# Patient Record
Sex: Female | Born: 1935 | Race: Black or African American | Hispanic: No | Marital: Single | Smoking: Never smoker
Health system: Southern US, Community
[De-identification: ages and names within clinical notes are randomized; demographics above are authoritative.]

## PROBLEM LIST (undated history)

## (undated) DIAGNOSIS — N184 Chronic kidney disease, stage 4 (severe): Secondary | ICD-10-CM

## (undated) DIAGNOSIS — M1990 Unspecified osteoarthritis, unspecified site: Secondary | ICD-10-CM

## (undated) DIAGNOSIS — I5032 Chronic diastolic (congestive) heart failure: Secondary | ICD-10-CM

## (undated) DIAGNOSIS — R011 Cardiac murmur, unspecified: Secondary | ICD-10-CM

## (undated) DIAGNOSIS — I701 Atherosclerosis of renal artery: Secondary | ICD-10-CM

## (undated) DIAGNOSIS — I779 Disorder of arteries and arterioles, unspecified: Secondary | ICD-10-CM

## (undated) DIAGNOSIS — F329 Major depressive disorder, single episode, unspecified: Secondary | ICD-10-CM

## (undated) DIAGNOSIS — I272 Pulmonary hypertension, unspecified: Secondary | ICD-10-CM

## (undated) DIAGNOSIS — D649 Anemia, unspecified: Secondary | ICD-10-CM

## (undated) DIAGNOSIS — I130 Hypertensive heart and chronic kidney disease with heart failure and stage 1 through stage 4 chronic kidney disease, or unspecified chronic kidney disease: Secondary | ICD-10-CM

## (undated) DIAGNOSIS — F32A Depression, unspecified: Secondary | ICD-10-CM

## (undated) DIAGNOSIS — F419 Anxiety disorder, unspecified: Secondary | ICD-10-CM

## (undated) DIAGNOSIS — R001 Bradycardia, unspecified: Secondary | ICD-10-CM

## (undated) DIAGNOSIS — N189 Chronic kidney disease, unspecified: Secondary | ICD-10-CM

## (undated) DIAGNOSIS — I739 Peripheral vascular disease, unspecified: Secondary | ICD-10-CM

## (undated) DIAGNOSIS — E7800 Pure hypercholesterolemia, unspecified: Secondary | ICD-10-CM

## (undated) DIAGNOSIS — Z86718 Personal history of other venous thrombosis and embolism: Secondary | ICD-10-CM

## (undated) HISTORY — DX: Pulmonary hypertension, unspecified: I27.20

## (undated) HISTORY — DX: Disorder of arteries and arterioles, unspecified: I77.9

## (undated) HISTORY — DX: Bradycardia, unspecified: R00.1

## (undated) HISTORY — DX: Personal history of other venous thrombosis and embolism: Z86.718

## (undated) HISTORY — PX: OTHER SURGICAL HISTORY: SHX169

## (undated) HISTORY — PX: JOINT REPLACEMENT: SHX530

## (undated) HISTORY — DX: Anxiety disorder, unspecified: F41.9

## (undated) HISTORY — DX: Chronic kidney disease, unspecified: N18.9

## (undated) HISTORY — DX: Chronic kidney disease, stage 4 (severe): N18.4

## (undated) HISTORY — DX: Depression, unspecified: F32.A

## (undated) HISTORY — DX: Peripheral vascular disease, unspecified: I73.9

## (undated) HISTORY — DX: Anemia, unspecified: D64.9

## (undated) HISTORY — DX: Hypertensive heart and chronic kidney disease with heart failure and stage 1 through stage 4 chronic kidney disease, or unspecified chronic kidney disease: I13.0

## (undated) HISTORY — DX: Chronic diastolic (congestive) heart failure: I50.32

## (undated) HISTORY — DX: Atherosclerosis of renal artery: I70.1

---
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
## 1998-02-12 ENCOUNTER — Emergency Department (HOSPITAL_COMMUNITY): Admission: EM | Admit: 1998-02-12 | Discharge: 1998-02-12 | Payer: Self-pay | Admitting: Emergency Medicine

## 1998-03-20 ENCOUNTER — Encounter: Admission: RE | Admit: 1998-03-20 | Discharge: 1998-06-18 | Payer: Self-pay | Admitting: Orthopedic Surgery

## 1998-10-14 ENCOUNTER — Encounter: Payer: Self-pay | Admitting: Family Medicine

## 1998-10-14 ENCOUNTER — Ambulatory Visit (HOSPITAL_COMMUNITY): Admission: RE | Admit: 1998-10-14 | Discharge: 1998-10-14 | Payer: Self-pay | Admitting: Family Medicine

## 1998-10-20 ENCOUNTER — Encounter: Admission: RE | Admit: 1998-10-20 | Discharge: 1998-11-04 | Payer: Self-pay | Admitting: Family Medicine

## 1999-03-10 ENCOUNTER — Ambulatory Visit (HOSPITAL_COMMUNITY): Admission: RE | Admit: 1999-03-10 | Discharge: 1999-03-10 | Payer: Self-pay | Admitting: Family Medicine

## 1999-04-30 ENCOUNTER — Emergency Department (HOSPITAL_COMMUNITY): Admission: EM | Admit: 1999-04-30 | Discharge: 1999-04-30 | Payer: Self-pay | Admitting: Emergency Medicine

## 1999-04-30 ENCOUNTER — Encounter: Payer: Self-pay | Admitting: Emergency Medicine

## 2000-02-17 ENCOUNTER — Ambulatory Visit (HOSPITAL_COMMUNITY): Admission: RE | Admit: 2000-02-17 | Discharge: 2000-02-17 | Payer: Self-pay | Admitting: Gastroenterology

## 2000-08-10 ENCOUNTER — Encounter: Payer: Self-pay | Admitting: Family Medicine

## 2000-08-10 ENCOUNTER — Ambulatory Visit (HOSPITAL_COMMUNITY): Admission: RE | Admit: 2000-08-10 | Discharge: 2000-08-10 | Payer: Self-pay | Admitting: Family Medicine

## 2000-10-04 ENCOUNTER — Encounter: Payer: Self-pay | Admitting: Cardiology

## 2000-10-04 ENCOUNTER — Ambulatory Visit (HOSPITAL_COMMUNITY): Admission: RE | Admit: 2000-10-04 | Discharge: 2000-10-04 | Payer: Self-pay | Admitting: Cardiology

## 2000-10-13 ENCOUNTER — Ambulatory Visit (HOSPITAL_COMMUNITY): Admission: RE | Admit: 2000-10-13 | Discharge: 2000-10-13 | Payer: Self-pay | Admitting: Cardiology

## 2000-10-17 ENCOUNTER — Ambulatory Visit (HOSPITAL_COMMUNITY): Admission: RE | Admit: 2000-10-17 | Discharge: 2000-10-17 | Payer: Self-pay | Admitting: Cardiology

## 2000-10-17 ENCOUNTER — Encounter: Payer: Self-pay | Admitting: Cardiology

## 2000-12-14 ENCOUNTER — Ambulatory Visit (HOSPITAL_COMMUNITY): Admission: RE | Admit: 2000-12-14 | Discharge: 2000-12-14 | Payer: Self-pay | Admitting: *Deleted

## 2001-05-23 ENCOUNTER — Ambulatory Visit (HOSPITAL_COMMUNITY): Admission: RE | Admit: 2001-05-23 | Discharge: 2001-05-23 | Payer: Self-pay | Admitting: Orthopedic Surgery

## 2001-05-23 ENCOUNTER — Encounter (INDEPENDENT_AMBULATORY_CARE_PROVIDER_SITE_OTHER): Payer: Self-pay | Admitting: *Deleted

## 2001-06-20 ENCOUNTER — Other Ambulatory Visit: Admission: RE | Admit: 2001-06-20 | Discharge: 2001-06-20 | Payer: Self-pay | Admitting: Family Medicine

## 2001-07-06 ENCOUNTER — Ambulatory Visit (HOSPITAL_COMMUNITY): Admission: RE | Admit: 2001-07-06 | Discharge: 2001-07-06 | Payer: Self-pay | Admitting: Family Medicine

## 2001-07-06 ENCOUNTER — Encounter: Payer: Self-pay | Admitting: Family Medicine

## 2004-06-16 ENCOUNTER — Encounter (INDEPENDENT_AMBULATORY_CARE_PROVIDER_SITE_OTHER): Payer: Self-pay | Admitting: Specialist

## 2004-06-16 ENCOUNTER — Ambulatory Visit (HOSPITAL_COMMUNITY): Admission: RE | Admit: 2004-06-16 | Discharge: 2004-06-16 | Payer: Self-pay | Admitting: Gastroenterology

## 2004-07-15 ENCOUNTER — Ambulatory Visit (HOSPITAL_COMMUNITY): Admission: RE | Admit: 2004-07-15 | Discharge: 2004-07-15 | Payer: Self-pay | Admitting: Gastroenterology

## 2005-01-13 ENCOUNTER — Other Ambulatory Visit: Admission: RE | Admit: 2005-01-13 | Discharge: 2005-01-13 | Payer: Self-pay | Admitting: Family Medicine

## 2005-02-03 ENCOUNTER — Ambulatory Visit (HOSPITAL_COMMUNITY): Admission: RE | Admit: 2005-02-03 | Discharge: 2005-02-03 | Payer: Self-pay | Admitting: Orthopedic Surgery

## 2005-02-23 ENCOUNTER — Encounter: Admission: RE | Admit: 2005-02-23 | Discharge: 2005-02-23 | Payer: Self-pay | Admitting: Family Medicine

## 2005-04-09 ENCOUNTER — Encounter: Admission: RE | Admit: 2005-04-09 | Discharge: 2005-04-22 | Payer: Self-pay | Admitting: Orthopedic Surgery

## 2005-09-30 ENCOUNTER — Inpatient Hospital Stay (HOSPITAL_COMMUNITY): Admission: RE | Admit: 2005-09-30 | Discharge: 2005-10-05 | Payer: Self-pay | Admitting: Orthopedic Surgery

## 2005-11-04 ENCOUNTER — Encounter: Admission: RE | Admit: 2005-11-04 | Discharge: 2005-11-04 | Payer: Self-pay | Admitting: Orthopedic Surgery

## 2006-04-13 ENCOUNTER — Encounter: Admission: RE | Admit: 2006-04-13 | Discharge: 2006-04-13 | Payer: Self-pay | Admitting: Family Medicine

## 2007-03-08 ENCOUNTER — Encounter: Admission: RE | Admit: 2007-03-08 | Discharge: 2007-03-08 | Payer: Self-pay | Admitting: Orthopedic Surgery

## 2007-05-16 ENCOUNTER — Encounter (INDEPENDENT_AMBULATORY_CARE_PROVIDER_SITE_OTHER): Payer: Self-pay | Admitting: Family Medicine

## 2007-05-16 ENCOUNTER — Ambulatory Visit: Admission: RE | Admit: 2007-05-16 | Discharge: 2007-05-16 | Payer: Self-pay | Admitting: Family Medicine

## 2007-05-16 ENCOUNTER — Ambulatory Visit: Payer: Self-pay | Admitting: Vascular Surgery

## 2007-06-06 ENCOUNTER — Encounter: Admission: RE | Admit: 2007-06-06 | Discharge: 2007-06-06 | Payer: Self-pay | Admitting: Family Medicine

## 2007-06-08 ENCOUNTER — Ambulatory Visit: Payer: Self-pay | Admitting: *Deleted

## 2007-06-13 ENCOUNTER — Other Ambulatory Visit: Admission: RE | Admit: 2007-06-13 | Discharge: 2007-06-13 | Payer: Self-pay | Admitting: Family Medicine

## 2007-11-15 ENCOUNTER — Emergency Department (HOSPITAL_COMMUNITY): Admission: EM | Admit: 2007-11-15 | Discharge: 2007-11-15 | Payer: Self-pay | Admitting: Emergency Medicine

## 2007-11-21 ENCOUNTER — Encounter: Admission: RE | Admit: 2007-11-21 | Discharge: 2008-01-03 | Payer: Self-pay | Admitting: Family Medicine

## 2008-01-16 ENCOUNTER — Inpatient Hospital Stay (HOSPITAL_COMMUNITY): Admission: RE | Admit: 2008-01-16 | Discharge: 2008-01-19 | Payer: Self-pay | Admitting: Orthopedic Surgery

## 2008-02-25 ENCOUNTER — Emergency Department (HOSPITAL_COMMUNITY): Admission: EM | Admit: 2008-02-25 | Discharge: 2008-02-25 | Payer: Self-pay | Admitting: Emergency Medicine

## 2008-03-02 ENCOUNTER — Inpatient Hospital Stay (HOSPITAL_COMMUNITY): Admission: RE | Admit: 2008-03-02 | Discharge: 2008-03-05 | Payer: Self-pay | Admitting: Orthopedic Surgery

## 2008-12-13 ENCOUNTER — Encounter: Admission: RE | Admit: 2008-12-13 | Discharge: 2008-12-13 | Payer: Self-pay | Admitting: Orthopedic Surgery

## 2008-12-19 ENCOUNTER — Inpatient Hospital Stay (HOSPITAL_COMMUNITY): Admission: RE | Admit: 2008-12-19 | Discharge: 2008-12-24 | Payer: Self-pay | Admitting: Orthopedic Surgery

## 2009-02-04 ENCOUNTER — Encounter: Admission: RE | Admit: 2009-02-04 | Discharge: 2009-05-05 | Payer: Self-pay | Admitting: Orthopedic Surgery

## 2009-03-19 ENCOUNTER — Other Ambulatory Visit: Admission: RE | Admit: 2009-03-19 | Discharge: 2009-03-19 | Payer: Self-pay | Admitting: Family Medicine

## 2009-04-02 ENCOUNTER — Encounter: Admission: RE | Admit: 2009-04-02 | Discharge: 2009-04-02 | Payer: Self-pay | Admitting: Family Medicine

## 2009-05-07 ENCOUNTER — Encounter: Admission: RE | Admit: 2009-05-07 | Discharge: 2009-05-07 | Payer: Self-pay | Admitting: Orthopedic Surgery

## 2009-08-07 ENCOUNTER — Encounter: Admission: RE | Admit: 2009-08-07 | Discharge: 2009-08-07 | Payer: Self-pay | Admitting: Orthopedic Surgery

## 2010-06-19 IMAGING — CT CT ANGIO CHEST
2 of 6 series · 19 of 36 positions shown · IV contrast (APPLIED)
Comparison: Plain film 12/19/2008.

CLINICAL DATA: Patellar tendon rupture.  Short of breath.  Chest
pain.  Evaluate for pulmonary embolus.  History over orthopedic
injury.

CT ANGIOGRAPHY CHEST
TECHNIQUE: Multidetector CT imaging of the chest was performed
using the standard protocol during bolus administration of
intravenous contrast. Multiplanar CT image reconstructions
including MIPs were obtained to evaluate the vascular anatomy.
Contrast: 100 ml [id number].

[Series 9: pulm embolism 1.0 b25f thins · axial · 0.63mm/px · z∈[-118,+78]mm · 16 of 218 slices shown]
[im 11/218  lung]
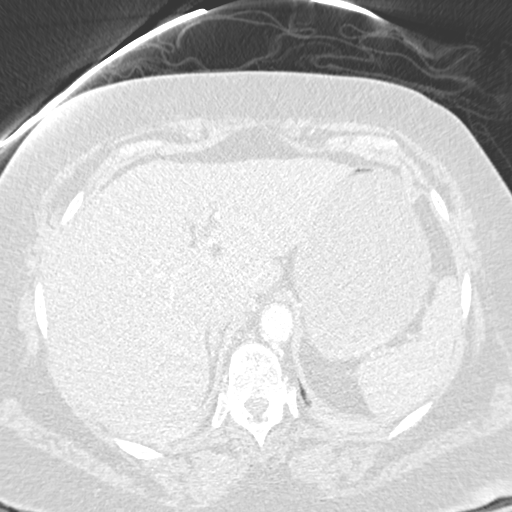
[im 22/218  mediastinal]
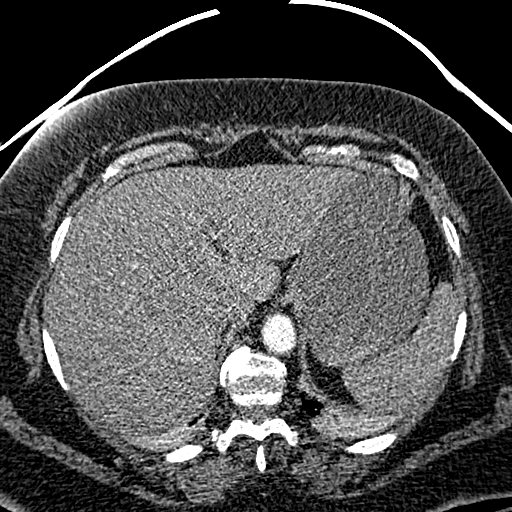
[im 33/218  lung]
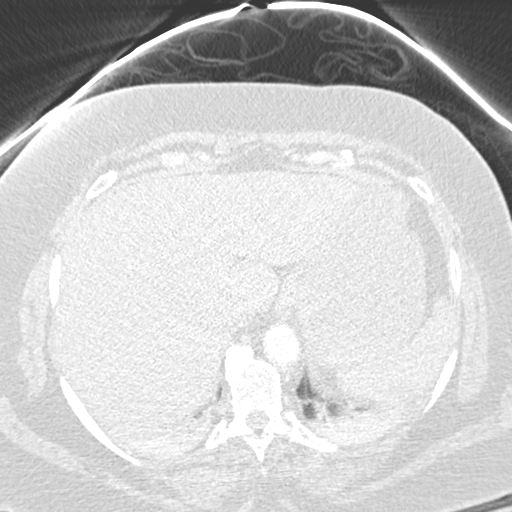
[im 55/218  mediastinal]
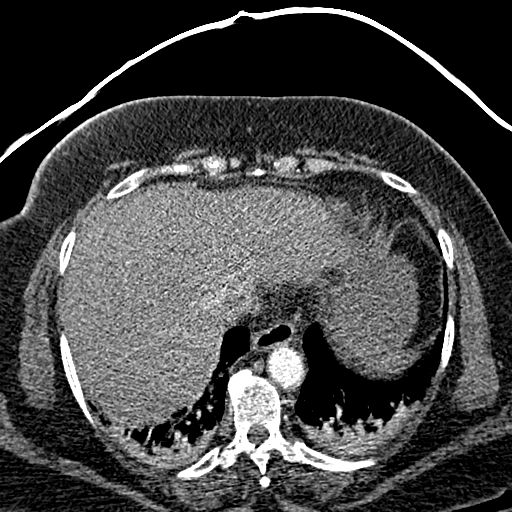
[im 66/218  lung]
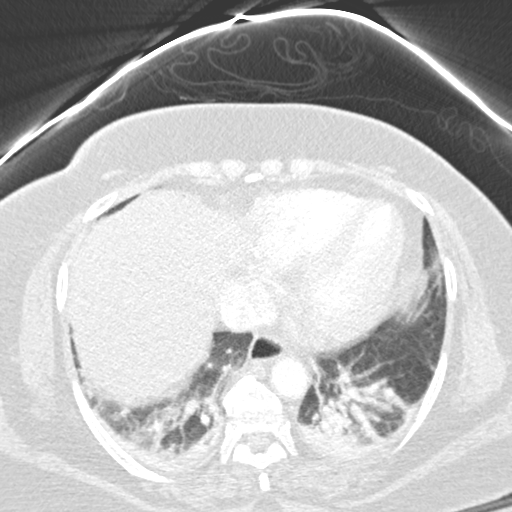
[im 76/218  mediastinal]
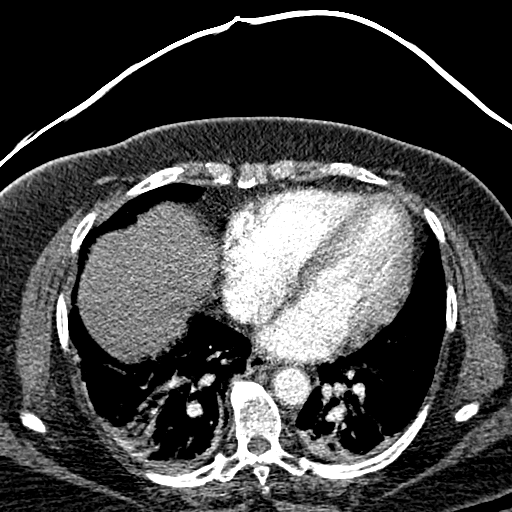
[im 87/218  lung]
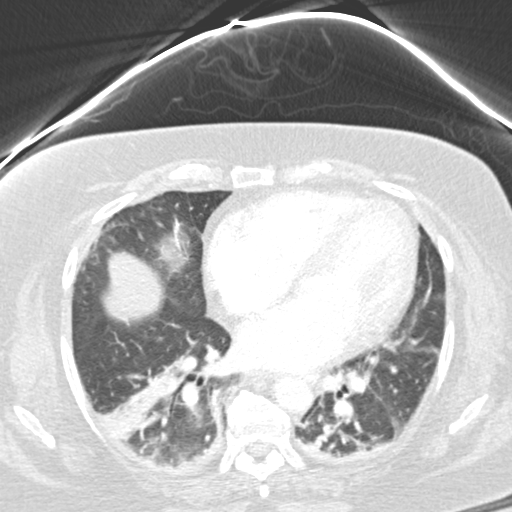
[im 98/218  mediastinal]
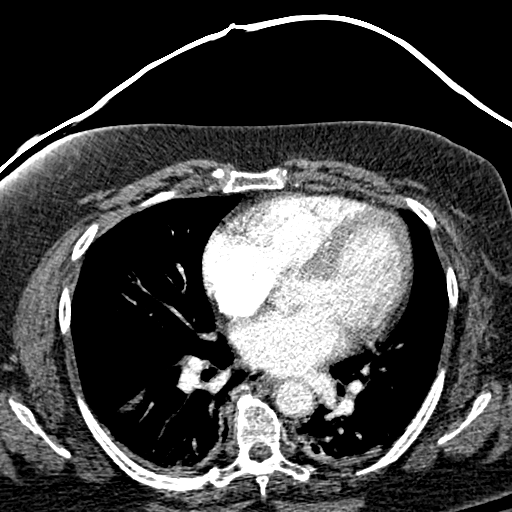
[im 120/218  lung]
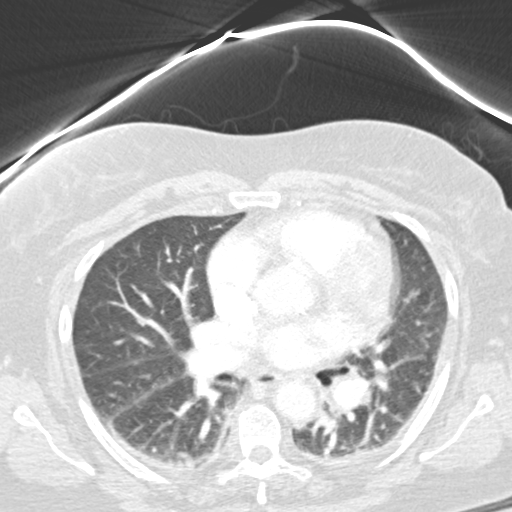
[im 131/218  mediastinal]
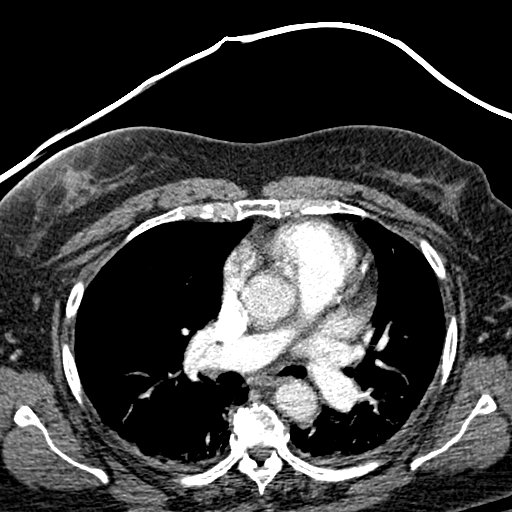
[im 142/218  lung]
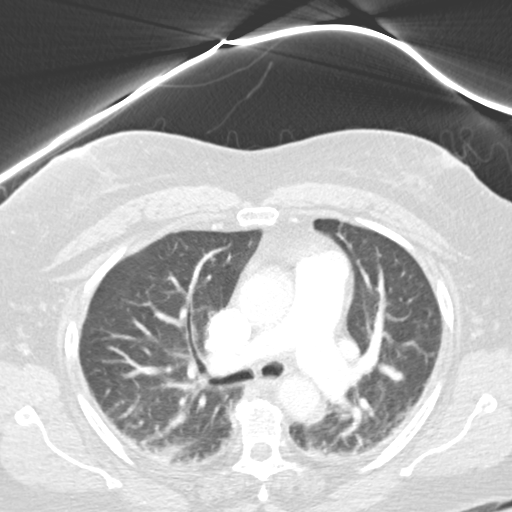
[im 152/218  mediastinal]
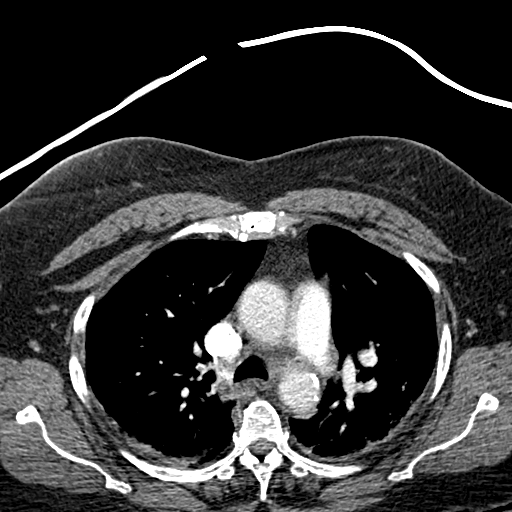
[im 163/218  lung]
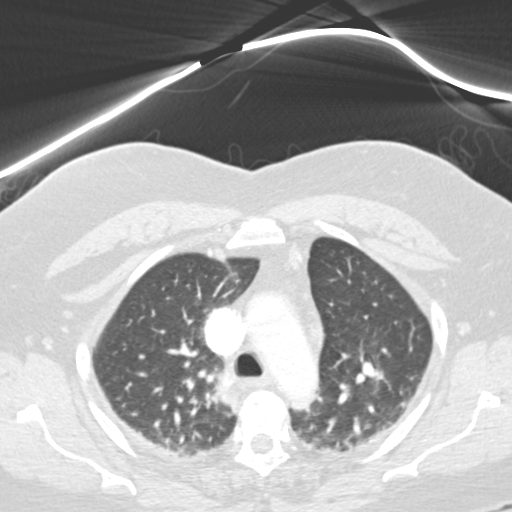
[im 185/218  mediastinal]
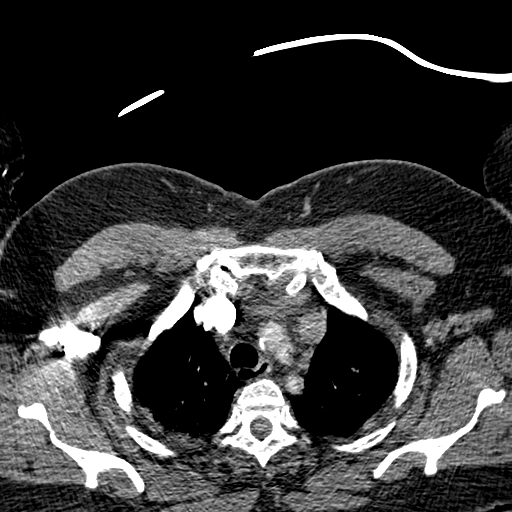
[im 196/218  lung]
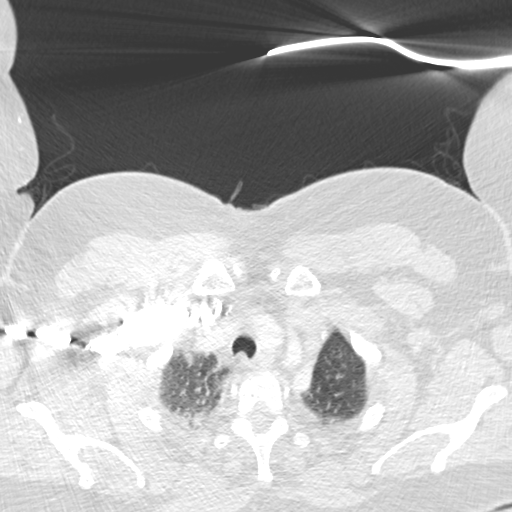
[im 207/218  mediastinal]
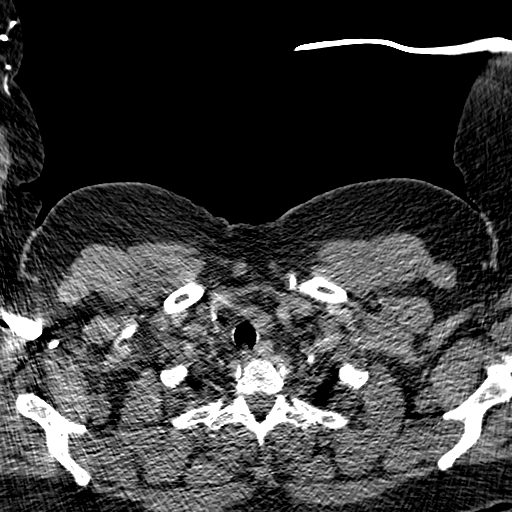

[Series 10: pulm embolism 0.75 spo thins · coronal · 0.63mm/px · 3 of 778 slices shown]
[im 195/778  mediastinal]
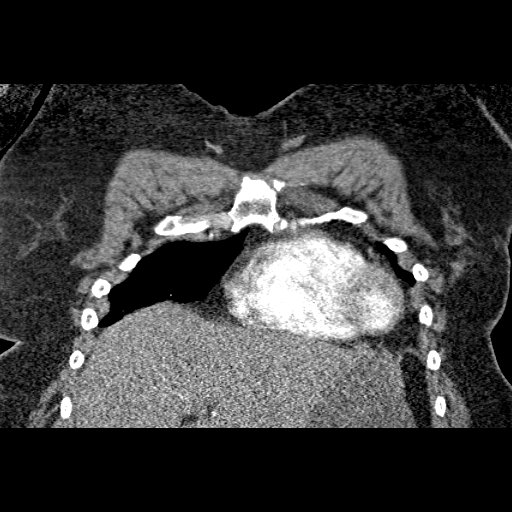
[im 389/778  mediastinal]
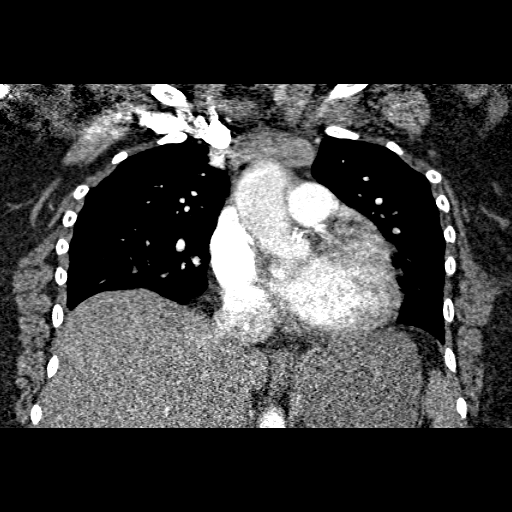
[im 583/778  mediastinal]
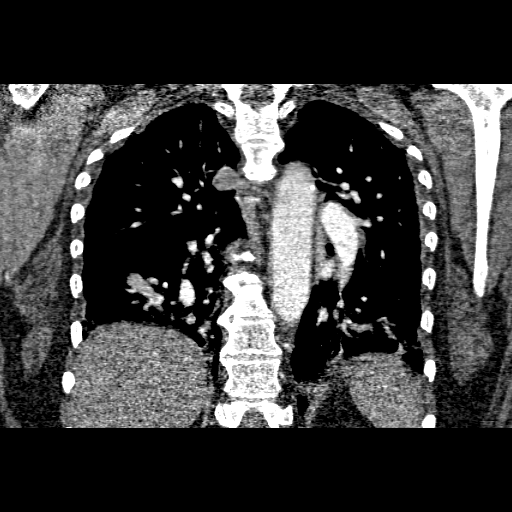

[19 of 36 positions shown; findings below may reference images not displayed]

FINDINGS: Study is mildly limited in its evaluation of pulmonary
embolus due to bolus dispersion.  Subsegmental vessels not
evaluated and some of the segmental vessels are poorly seen as
well.  Larger vessels are well evaluated.  No central pulmonary
embolus is present.  Aorta demonstrates atherosclerosis which
extends into the branch vessels.  Coronary artery atherosclerosis
is present. If office based assessment of coronary risk factors has
not been performed, it is now recommended.  .  Incidental imaging
of the upper abdomen is unremarkable.  No pleural effusions are
present.  Substantial dependent atelectasis is present with
segmental and segmental atelectasis in the right lower lobe.  No
focal airspace disease is present however difficult to exclude
based on the amount of atelectasis.  There is some mild
interlobular septal thickening at the apices suggesting volume
overload as well.  Bone windows appear within normal limits.
Diffuse idiopathic skeletal hyperostosis of the thoracic spine is
present.

 Review of the MIP images confirms the above findings.
IMPRESSION: 1.  Technically adequate study for evaluation of central pulmonary
embolus.  No pulmonary embolus is present.
2.  Substantial dependent atelectasis in the lungs, which can be
associated with shortness of breath.
3.  Atherosclerosis and coronary artery disease.

## 2010-08-27 ENCOUNTER — Encounter
Admission: RE | Admit: 2010-08-27 | Discharge: 2010-08-27 | Payer: Self-pay | Source: Home / Self Care | Attending: Family Medicine | Admitting: Family Medicine

## 2010-12-09 LAB — COMPREHENSIVE METABOLIC PANEL
ALT: 11 U/L (ref 0–35)
ALT: 13 U/L (ref 0–35)
AST: 31 U/L (ref 0–37)
AST: 44 U/L — ABNORMAL HIGH (ref 0–37)
Albumin: 2.8 g/dL — ABNORMAL LOW (ref 3.5–5.2)
Albumin: 3.4 g/dL — ABNORMAL LOW (ref 3.5–5.2)
Alkaline Phosphatase: 53 U/L (ref 39–117)
Alkaline Phosphatase: 56 U/L (ref 39–117)
Alkaline Phosphatase: 72 U/L (ref 39–117)
BUN: 14 mg/dL (ref 6–23)
BUN: 17 mg/dL (ref 6–23)
CO2: 26 mEq/L (ref 19–32)
CO2: 27 mEq/L (ref 19–32)
Chloride: 103 mEq/L (ref 96–112)
Creatinine, Ser: 1.37 mg/dL — ABNORMAL HIGH (ref 0.4–1.2)
GFR calc Af Amer: 45 mL/min — ABNORMAL LOW (ref >60)
GFR calc non Af Amer: 37 mL/min — ABNORMAL LOW (ref >60)
GFR calc non Af Amer: 44 mL/min — ABNORMAL LOW (ref >60)
Glucose, Bld: 100 mg/dL — ABNORMAL HIGH (ref 70–99)
Glucose, Bld: 120 mg/dL — ABNORMAL HIGH (ref 70–99)
Potassium: 3.8 mEq/L (ref 3.5–5.1)
Potassium: 4.7 mEq/L (ref 3.5–5.1)
Sodium: 133 mEq/L — ABNORMAL LOW (ref 135–145)
Sodium: 136 mEq/L (ref 135–145)
Sodium: 142 mEq/L (ref 135–145)
Total Bilirubin: 0.4 mg/dL (ref 0.3–1.2)
Total Bilirubin: 0.5 mg/dL (ref 0.3–1.2)
Total Protein: 5.6 g/dL — ABNORMAL LOW (ref 6.0–8.3)
Total Protein: 6.1 g/dL (ref 6.0–8.3)

## 2010-12-09 LAB — PROTIME-INR
INR: 1.1 (ref 0.00–1.49)
INR: 1.4 (ref 0.00–1.49)
INR: 2.1 — ABNORMAL HIGH (ref 0.00–1.49)
Prothrombin Time: 14.1 seconds (ref 11.6–15.2)
Prothrombin Time: 23.8 seconds — ABNORMAL HIGH (ref 11.6–15.2)
Prothrombin Time: 25.3 seconds — ABNORMAL HIGH (ref 11.6–15.2)

## 2010-12-09 LAB — CBC
HCT: 33.1 % — ABNORMAL LOW (ref 36.0–46.0)
HCT: 34.1 % — ABNORMAL LOW (ref 36.0–46.0)
HCT: 36.2 % (ref 36.0–46.0)
Hemoglobin: 10.7 g/dL — ABNORMAL LOW (ref 12.0–15.0)
Hemoglobin: 11 g/dL — ABNORMAL LOW (ref 12.0–15.0)
MCV: 81.8 fL (ref 78.0–100.0)
MCV: 81.9 fL (ref 78.0–100.0)
MCV: 82.3 fL (ref 78.0–100.0)
Platelets: 124 10*3/uL — ABNORMAL LOW (ref 150–400)
Platelets: 127 10*3/uL — ABNORMAL LOW (ref 150–400)
Platelets: 144 10*3/uL — ABNORMAL LOW (ref 150–400)
Platelets: 160 10*3/uL (ref 150–400)
RBC: 3.99 MIL/uL (ref 3.87–5.11)
RBC: 4.45 MIL/uL (ref 3.87–5.11)
RDW: 15.6 % — ABNORMAL HIGH (ref 11.5–15.5)
RDW: 16 % — ABNORMAL HIGH (ref 11.5–15.5)
WBC: 5.4 10*3/uL (ref 4.0–10.5)
WBC: 6.2 10*3/uL (ref 4.0–10.5)
WBC: 6.7 10*3/uL (ref 4.0–10.5)

## 2010-12-09 LAB — URINALYSIS, ROUTINE W REFLEX MICROSCOPIC
Glucose, UA: NEGATIVE mg/dL
Glucose, UA: NEGATIVE mg/dL
Hgb urine dipstick: NEGATIVE
Hgb urine dipstick: NEGATIVE
Ketones, ur: NEGATIVE mg/dL
Specific Gravity, Urine: 1.024 (ref 1.005–1.030)
Urobilinogen, UA: 0.2 mg/dL (ref 0.0–1.0)
pH: 5.5 (ref 5.0–8.0)
pH: 5.5 (ref 5.0–8.0)

## 2010-12-09 LAB — BASIC METABOLIC PANEL
BUN: 17 mg/dL (ref 6–23)
CO2: 26 mEq/L (ref 19–32)
Calcium: 7.9 mg/dL — ABNORMAL LOW (ref 8.4–10.5)
Calcium: 8.5 mg/dL (ref 8.4–10.5)
Chloride: 101 mEq/L (ref 96–112)
Creatinine, Ser: 1.03 mg/dL (ref 0.4–1.2)
Creatinine, Ser: 1.2 mg/dL (ref 0.4–1.2)
GFR calc Af Amer: 59 mL/min — ABNORMAL LOW (ref >60)
GFR calc Af Amer: >60 mL/min (ref >60)
GFR calc non Af Amer: 49 mL/min — ABNORMAL LOW (ref >60)
GFR calc non Af Amer: 52 mL/min — ABNORMAL LOW (ref >60)
Glucose, Bld: 154 mg/dL — ABNORMAL HIGH (ref 70–99)
Potassium: 4.2 mEq/L (ref 3.5–5.1)
Sodium: 136 mEq/L (ref 135–145)

## 2010-12-09 LAB — SODIUM, URINE, RANDOM: Sodium, Ur: 27 mEq/L

## 2010-12-09 LAB — URINE MICROSCOPIC-ADD ON

## 2010-12-09 LAB — BLOOD GAS, ARTERIAL
Acid-base deficit: 1.4 mmol/L (ref 0.0–2.0)
O2 Content: 4 L/min
O2 Saturation: 95 %
Patient temperature: 98.6
TCO2: 25.4 mmol/L (ref 0–100)

## 2011-01-12 NOTE — H&P (Signed)
NAMELEITH, [name]              ACCOUNT NO.:  [id number]   MEDICAL RECORD NO.:  [id number]          PATIENT TYPE:  INP   LOCATION:  5018                         FACILITY:  [facility]   PHYSICIAN:  Marily Memos, MD      DATE OF BIRTH:  [birth year]   DATE OF ADMISSION:  03/02/2008  DATE OF DISCHARGE:                              HISTORY & PHYSICAL   CHIEF COMPLAINT:  Painful swollen right knee.   HISTORY OF PRESENT ILLNESS:  This is a 77 year old Black female who had  recently had right total knee replacement approximately 2 months ago.  The patient was taking a shower and the rubber matting in her shower  slid up on her foot causing her to fall into the shower.  The patient  did this last Sunday.  The patient was seemed to have no fractures or  dislocation of the implant but continued to have pain and swelling.  The  patient was seen at [facility].  The patient was seen in  the office on March 01, 2008, and found to have rupture of patellar  tendon.   PAST MEDICAL HISTORY:  1. Bilateral knee replacements.  2. Left ankle fracture with ORIF.  3. History of hypertension.  4. Degenerative joint disease.  5. New history of diabetes.   ALLERGIES:  None known.   FAMILY HISTORY:  Noncontributory.   REVIEW OF SYSTEMS:  No cardiac or respiratory.  No urinary or bowel  symptoms.   SOCIAL HISTORY:  No history of use of alcohol or tobacco.   MEDICATIONS:  1. Flexeril 5 mg b.i.d.  2. Zocor 10/40 daily.  3. Xanax 0.5 mg p.r.n.  4. Percocet 5 mg p.r.n.  5. Crestor 20 mg daily.  6. Ferrous sulfate daily.  7. Tekturna/hydrochlorothiazide 300/25 daily.   PHYSICAL EXAMINATION:  GENERAL:  Alert and oriented in no acute  distress.  VITAL SIGNS:  Temperature 98.1, pulse 67, respirations 16, blood  pressure 143/55, height 5 feet 5 inches, weight 213, and O2 saturation  97%.  HEENT:  Head:  Normocephalic.  Eyes, noses, and throat are clear.  NECK:  Supple.  CHEST:  Clear.  CARDIAC:  S1 and S2 and regular.  ABDOMEN:  Soft.  Active bowel sounds.  EXTREMITIES:  Right knee +3 effusions, markedly tender with palpable  defect at the patella junction.  The patient lacks ability to extend the  right knee.   X-ray reveals joint effusion.  No fractures.  Total knee implant intact.   IMPRESSION:  Patellar tendon rupture, right knee.   PLAN:  Repair of right patellar tendon.      Marily Memos, MD  Electronically Signed     AC/MEDQ  D:  03/04/2008  T:  03/05/2008  Job:  [id number]

## 2011-01-12 NOTE — Consult Note (Signed)
VASCULAR SURGERY CONSULTATION   [name], [name]  DOB:  March 24, 1929                                       06/08/2007  [id number]   REASON FOR CONSULTATION:  Abnormal carotid Doppler with moderate right  internal carotid artery stenosis.   HISTORY:  Patient is seen in consultation today upon referral by Dr.  Lucianne Lei for abnormal carotid Doppler evaluation.  During recent  physical examination, she was noted to have a left carotid bruit.  Risk  factors for cerebrovascular disease include hyperlipidemia and  hypertension.  Patient has no history of stroke.   No recent neurologic symptoms.  Denies sensory, motor, or visual  deficit.  No speech problems.  No gait abnormality.   PAST MEDICAL HISTORY:  1. Hypertension.  2. Hyperlipidemia.  3. Peripheral vascular disease.   MEDICATIONS:  1. Crestor 20 mg daily.  2. Aspirin 81 mg daily.  3. Alprazolam 25 mg t.i.d. p.r.n.  4. Etodolac 400 mg Black.i.d.  5. Azor 10/40 1 tablet daily.  6. Tecturna 300/25 1 tablet daily.  7. Etodolac 400 mg Black.i.d.   ALLERGIES:  None known.   SOCIAL HISTORY:  Patient is single.  She has six grown children.  She is  retired.  Does not use alcohol or tobacco.  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   FAMILY HISTORY:  Noncontributory.   REVIEW OF SYSTEMS:  She struggles with weight gain.  Notes chronic pain  in her legs with walking.  Has degenerative arthritis of the knees,  status post left knee replacement.  Suffers from chronic nervousness.  Eyes, ears, nose, and throat, cardiovascular, respiratory, GI, GU,  neurologic, endocrine, and hematologic review is negative.  (Refer to  patient encounter form).   PHYSICAL EXAMINATION:  A 77 year old moderately obese female.  Alert and  oriented.  No acute distress.  BP is 183/73, pulse 69 per minute, O2  saturation 100%.  HEENT:  Mouth and throat are clear.  Extraocular  movements are intact.   Normocephalic.  Neck supple.  No thyromegaly or  adenopathy.  Cardiovascular:  Soft left carotid bruit.  Normal heart  sounds.  No murmurs, rubs or gallops.  Regular rhythm.  Respiratory:  Air entry equal bilaterally.  No rales or rhonchi.  GI:  Abdomen is soft  and nontender.  No masses or organomegaly.  Bowel sounds active.  No  bruits.  Neurologic:  Cranial nerves are intact.  Equal strength  bilaterally.  Reflexes 1+.  Normal sensation.  Musculoskeletal:  Full  range of motion in all extremities.  Mild right knee pain with range of  motion.   IMPRESSION:  1. Asymptomatic moderate left internal carotid artery stenosis.  2. Hypertension.  3. Hyperlipidemia.  4. Degenerative joint disease.   MEDICAL DECISION MAKING:  Patient has asymptomatic moderate left  internal carotid artery stenosis.  Current risk associated with this is  minimal.  Continue aspirin 81 mg daily.  Plan surveillance carotid  Doppler in six months.  Patient is to notify the office if any new onset  of symptoms, including sensory, motor, or visual deficit.   Dorothea Glassman, M.D.  Electronically Signed  PGH/MEDQ  D:  06/08/2007  T:  06/09/2007  Job:  [id number]

## 2011-01-12 NOTE — Op Note (Signed)
[name], [name]              ACCOUNT NO.:  [id number]   MEDICAL RECORD NO.:  [id number]          PATIENT TYPE:  INP   LOCATION:  5011                         FACILITY:  [facility]   PHYSICIAN:  Marily Memos, MD      DATE OF BIRTH:  07/07/1929   DATE OF PROCEDURE:  12/19/2008  DATE OF DISCHARGE:                               OPERATIVE REPORT   PREOPERATIVE DIAGNOSIS:  Rerupture of patellar tendon, right knee.   POSTOPERATIVE DIAGNOSIS:  Rerupture of patellar tendon, right knee.   ANESTHESIA:  General.   PROCEDURE:  Reconstruction of right patellar tendon with semitendinous  graft, right knee.   The patient was taken to the operating room and was given adequate preop  medications, given general anesthesia and intubated.  Right lower  extremity was prepped and draped in a sterile manner.  Tourniquet and  Bovie used for hemostasis.  Anterior midline incision was made over the  right knee going through the old previous scar going through the skin  and subcutaneous tissue.  Short blunt dissection was made both medial  and laterally.  The disrupted tendon was identified.  Short blunt  dissection was made over the pes anserinus, identifying the  semitendinous tendon.  A tendon retriever was placed over the tendon and  slid up the leg relieving the semitendinosus tendon.  Drill holes were  made through the tibial tuberosity passing of the graft through the  tibial tuberosity, and then through the distal end of the patellar  tendon, and then through the proximal end of the tendon then end to out  direction passing over on both sides and then overlapping back distally  over the midsection of the repair.  Mitek anchor was used to suture one  end into the tibia and the other end was buried in again through the  second draw hole through the tibia and stitched down to the periosteum.  Knee was kept in the extended position.  The retinaculum was then raised  and this is all done with a  FiberWire.  After the repair, tension was  applied through the knee and the repair was found whole quite well.  Copious irrigation was then done.  Few of the Vicryl was used to close  the fascia and subcutaneous tissue, and skin staples for the skin.  Compressive dressing was applied.  Knee immobilizer applied.  The  patient tolerated the procedure quite well and went to the recovery room  in stable and satisfactory condition.      Marily Memos, MD  Electronically Signed     AC/MEDQ  D:  12/19/2008  T:  12/20/2008  Job:  [id number]

## 2011-01-12 NOTE — Op Note (Signed)
[name], [name]              ACCOUNT NO.:  [id number]   MEDICAL RECORD NO.:  [id number]          PATIENT TYPE:  INP   LOCATION:  5040                         FACILITY:  [facility]   PHYSICIAN:  Marily Memos, MD      DATE OF BIRTH:  Jan 02, 1929   DATE OF PROCEDURE:  01/16/2008  DATE OF DISCHARGE:                               OPERATIVE REPORT   PREOPERATIVE DIAGNOSIS:  Degenerative joint disease, right knee.   POSTOPERATIVE DIAGNOSIS:  Degenerative joint disease, right knee.   ANESTHESIA:  General.   PROCEDURE:  Right total knee arthroplasty, Biomet implant.   PROCEDURE IN DETAIL:  The patient was taken to the operating room.  After given adequate preop medications given general anesthesia and  intubated, right lower extremity was prepped with DuraPrep and draped in  a sterile manner.  Tourniquet and Bovie used for hemostasis.  Anterior  midline incision made going from the quadriceps down to the tibial  tuberosity.  Sharp and blunt dissection was made down to the fascia.  A  median and paramedian incision made into the capsule extending from the  quadriceps down to the tibial tuberosity.  Patella was reflected  laterally.  Knee was taken into flexed position.  Osteophytes about the  tibia, femur, and patella were resected.  Soft tissue resection as well  as medial soft-tissue release due to varus deformity was done.  After  adequate soft tissue release, tibial cutting jig was put in place and 10  mm of the tibial plateau surface was resected.  Reaming down the femoral  canal for intramedullary rod, the distal femoral cutting jig was put in  place followed by sizing.  Sizing of the femur was that of 67.5.  Appropriate cutting jig was put in place.  Anterior and posterior cuts  were made and chamfer cuts were made.  Other soft tissue resection was  done.  Trial component for the femur was found to be good and snug fit.  Attention was then turned to the tibial surface which was  measured at 75  mm.  Appropriate cuts were put in place.  Tibial cuts were made.  Trial  components were put in place.  The knee was able to accomplish full  medial in an impression of full extension, full flexion.  A 14 mm Poly  was found to be more stable given better medial and lateral stability.  Next, the patella was sized at 35 mm large and the appropriate cut was  made.  All 3 trial components were put in place.  Range of motion of  full flexion, full extension, good medial and lateral stability, no  subluxation of the patella was noted.  Copious irrigation was then done.  Methyl methacrylate was mixed.  Patella and tibial components were  cemented.  Femoral component was press fitted.  At the setting of the  cement, again final components was that of 67.5 porous-coated femoral  component, large 35-mm patella, and a 75-mm high-beam tibial plate.  After retesting the knee and finding good tracking of the patella, good  medial and lateral stability, full flexion, full  extension, the final  polyethylene was that of a 14 mm.  This was put and locked in place.  Tourniquet was let down.  Hemostasis was obtained.  Wound closure was  then done with 0 Vicryl for the fascia, 2-0 for the  subcutaneous, and skin staples for the skin.  Bulky compressive dressing  was applied.  Also knee immobilizer applied.  The patient tolerated the  procedure quite well and went to recovery room in stable and  satisfactory condition.      Marily Memos, MD  Electronically Signed     AC/MEDQ  D:  01/16/2008  T:  01/17/2008  Job:  [id number]

## 2011-01-12 NOTE — Discharge Summary (Signed)
NAMEAMIYLAH, WEIDEMAN              ACCOUNT NO.:  [id number]   MEDICAL RECORD NO.:  [id number]          PATIENT TYPE:  INP   LOCATION:  5011                         FACILITY:  [facility]   PHYSICIAN:  Marily Memos, MD      DATE OF BIRTH:  07-Sep-1928   DATE OF ADMISSION:  12/19/2008  DATE OF DISCHARGE:                               DISCHARGE SUMMARY   ADMISSION DIAGNOSIS:  1. Patellar rerupture, right patellar tendon.  2. History of hypertension.  3. History of diabetes mellitus.   DISCHARGE DIAGNOSIS:  1. Patellar rerupture, right patellar tendon.  2. History of hypertension.  3. History of diabetes mellitus.  4. Urinary tract infection.  5. Acute respiratory failure with atelectasis.  6. Dehydration.   COMPLICATIONS:  Acute respiratory failure, atelectasis, urinary tract  infection.   OPERATIONS:  Reconstruction, right patellar tendon.   PERTINENT HISTORY:  This is a 77 year old female who initially underwent  right total knee arthroplasty and did quite well until she fell in her  shower and ruptured her right patellar tendon.  The patient had repair  of right patellar tendon followed by physical therapy.  The patient had  a fall in her church at the stairway and reruptured her patellar tendon.   Pertinent physical was that of the right knee, palpable defect,  effusion, tenderness of the junction of the inferior pole of the patella  and proximal to the tibial tuberosity.  The patient lacked ability to  extend her right knee.   X-ray revealed effusion and high riding of the patella.   HOSPITAL COURSE:  The patient underwent preop laboratory, CBC, UA, EKG,  chest x-ray, PT/PTT, platelet count, and CMET.  The patient's laboratory  was stable enough for the patient to undergo surgery.  The patient  underwent reconstruction of the patellar tendon using the semitendinosus  tendon.  The patient tolerated the procedure quite well.  Postop course:  The patient developed onset of  shortness of breath with respiratory  distress and low O2 saturation.  The patient's chest x-ray demonstrated  infiltrates and atelectasis bilaterally.  [facility] consultation was  made.  The patient had a lung scan to rule out pulmonary emboli.  No  pulmonary emboli were identified.  The patient was started on  respiratory therapy.  O2 saturations came back up to 90 to 100%.  The  patient progressed to physical therapy, non-weightbearing on the right  side with the use of a walker.  The patient's pain was brought under  control.  The patient has been stable.  The patient recently was found  to have a urinary tract infection and presently is being treated with  Septra b.i.d. times four more days.  The patient's H and H was stable,  afebrile.  Pain is under control.  The patient's wound is healing well.  At this point, no respiratory difficulty.  The patient is being  discharged to a nursing home for further rehabilitation.  Physical  therapy orders are to keep the knee immobilizer on at all times other  than dressing changes to the right knee, non-weightbearing on the right  side with the use of a walker.   DISCHARGE MEDICATIONS:  The patient is on 3 mg of Coumadin daily, weekly  INRs, Robaxin 500 mg b.i.d., ferrous sulfate 325 mg b.i.d., aspirin 81  mg daily, Flexeril 10 mg daily, Norvasc 10 mg daily, multivitamin daily,  Tekturna 300 mg daily, Colace 100 mg b.i.d., hydrochlorothiazide 12.5 mg  daily, niacin 100 mg b.i.d., Septra DS one tab b.i.d., albuterol 2.5 mg  b.i.d., Atrovent 0.5 mg b.i.d., Ambien 5 mg q.h.s. p.r.n., Percocet one  to two q.6 hours p.r.n., Zofran 4 mg q.6 hours p.r.n., Xanax 0.5 mg  t.i.d. p.r.n., moist heat to the right foot for 45 minutes t.i.d. times  two days.  The patient's Septra is for the next four days.   Daily dressing changes to the right knee.  The patient is not have any  range of motion to the right knee.   Return to the office in one week.   The  patient is being discharged in stable and satisfactory condition.      Marily Memos, MD  Electronically Signed     AC/MEDQ  D:  12/24/2008  T:  12/24/2008  Job:  [id number]

## 2011-01-12 NOTE — Op Note (Signed)
NAMELISBED, COWARD              ACCOUNT NO.:  [id number]   MEDICAL RECORD NO.:  [id number]          PATIENT TYPE:  INP   LOCATION:  [id number]                         FACILITY:  [facility]   PHYSICIAN:  Marily Memos, MD      DATE OF BIRTH:  May 19, 1929   DATE OF PROCEDURE:  03/02/2008  DATE OF DISCHARGE:                               OPERATIVE REPORT   PREOPERATIVE DIAGNOSIS:  Ruptured right patellar tendon.   POSTOPERATIVE DIAGNOSIS:  Ruptured right patellar tendon.   ANESTHESIA:  General.   PROCEDURE:  Repair of right patellar tendon.  The patient taken to the  operating room and was given adequate preop medications, given general  anesthesia and was intubated.  Right lower extremity which was prepped  with DuraPrep and draped in sterile manner.  Tourniquet and Bovie used  for hemostasis.  Incision made anteriorly through the old previous scar.  Going through the skin and subcutaneous tissue, hematoma was evacuated  from the joint from the ruptured area.  Patellar tendon was found to be  completely torn with tears of both medial and lateral retinaculum.  Irrigation was done.  Hematoma was evacuated.  Small drill hole was  placed through the tibial tuberosity.  FiberWire suture was passed  through the tibial tuberosity and mattress through the patellar tendon.  A separate FiberWire suture was also passed through the remnant of the  patella tendon and through the attachment of the tibial tuberosity.  Separate interrupted sutures with FiberWire suture was made to  reapproximate both medial and lateral retinaculum.  Patellar tendon and  sutures were then tied, the knee extended and copious irrigation was  then done followed by wound closure with 2-0 Vicryl for the subcutaneous  tissue and skin stapled with skin.  Compressive dressing was applied.  The patient tolerated the procedure quite well and went to recovery room  in stable and satisfactory position.      Marily Memos, MD  Electronically Signed     AC/MEDQ  D:  03/02/2008  T:  03/02/2008  Job:  [id number]

## 2011-01-12 NOTE — Consult Note (Signed)
[name], [name]              ACCOUNT NO.:  [id number]   MEDICAL RECORD NO.:  [id number]          PATIENT TYPE:  INP   LOCATION:  5011                         FACILITY:  [facility]   PHYSICIAN:  Mobolaji B. Bakare, M.D.DATE OF BIRTH:  1929/03/11   DATE OF CONSULTATION:  DATE OF DISCHARGE:                                 CONSULTATION   REQUESTING PHYSICIAN:  Marily Memos, MD   REASON FOR CONSULTATION:  Desaturation.   HISTORY OF PRESENTING COMPLAINT:  [name] is a 77 year old African  American female who underwent reconstruction of right patellar tendons  for ruptured right patellar tendons today.  She was admitted into the  hospital today.  The patient developed low oxygen saturation  postoperatively.  We have been consulted for further evaluation and  management.  Initial O2 sats postoperatively was 93% on 2 L.  The  patient desaturated further and required 50% Ventimask and she was  saturating at 95%.  She denies shortness of breath, chest pain.  No  diaphoresis or nausea and vomiting.  EKG showed sinus rhythm with  premature ventricular complexes.  No acute ST changes.  She has had a  chest x-ray, which showed cardiomegaly with bibasilar pulmonary  infiltrates.  Question of atelectasis, infiltrates, or early failure.  She had an ABG, which showed pH of 7.316, pO2 of 78, bicarb of 24, O2  sats of 95% on Venturi mask of 50%.  The patient has no history of  pulmonary disease.  She does not smoke tobacco.   CTPA, pulmonary artery is pending at this time.   REVIEW OF SYSTEMS:  There is no fever.  No chills.  No abdominal pain.  The patient denies cough.   PAST MEDICAL HISTORY:  1. Hypertension.  2. Degenerative joint disease.  3. Hyperlipidemia.  4. Bilateral knee replacement.  5. Left ankle fracture, status post open reduction and internal      fixation.   CURRENT MEDICATIONS:  1. Tekturna 200 mg p.o. daily.  2. Norvasc 10 mg p.o. daily.  3. Aspirin 81 mg p.o.  daily.  4. Lotensin 20 mg p.o. daily.  5. Ancef 1 g IV q.8 h.  6. Flexeril 10 mg p.o. daily.  7. Etodolac 200 mg q.6 h.  8. Ferrous sulfate 325 mg p.o. t.i.d.  9. Hydrochlorothiazide 75 mg p.o. daily.  10.Dilaudid 7.5, Dilaudid pump.  11.Robaxin 500 mg p.o. b.i.d.  12.Multivitamin 1 p.o. daily.  13.Crestor 20 mg p.o. every 1800 hours.  14.Warfarin 5 mg p.o. daily and started yesterday evening.  15.Xanax 0.5 mg t.i.d. p.r.n.  16.Benadryl p.r.n.  17.Ambien p.r.n.   ALLERGIES:  No known drug allergies.   SOCIAL HISTORY:  The patient does not smoke cigarettes or drink alcohol.   PHYSICAL EXAMINATION:  VITAL SIGNS:  Temperature 97.1, pulse 76,  respiratory 16, blood pressure 109/50, O2 sats of 95% on Venturi mask of  50.  GENERAL:  On examination, the patient is not in acute respiratory  distress.  HEENT:  Normocephalic, atraumatic.  Pupils equal, round, and reactive to  light.  The patient is not drowsy.  No elevated JVD.  LUNGS:  Bibasilar  crackles.  No wheeze or rhonchi.  CVS:  S1 and S2 regular.  ABDOMEN:  Not distended.  Soft, nontender.  Bowel sounds present.  Abdomen is slightly obese.  EXTREMITIES:  Lower extremity, 1+ pitting pedal edema.  Right knee is in  the Crepe bandage.  CNS:  No focal neurological deficit.   LABORATORY DATA:  ABG as mentioned in the HPI.  PT 14.1, INR 1.1.  Sodium 142, potassium 4.7, chloride 107, bicarb 23, glucose 100,  creatinine 1.08.  Total bilirubin 0.8, alkaline phosphatase 72, AST 51,  ALT 20, total protein 6.1, albumin 3.4, calcium 7.9.  Urinalysis shows  many bacteria with white blood cell of 3-6.  This was done 2 days ago.  The patient has positive nitrite and leukocyte esterase, white cell 6.0,  platelets 182, hemoglobin 13.1.   EKG, sinus rhythm with no acute ST changes.   Chest x-ray shows bibasilar infiltrates, probable atelectasis,  infiltrate, or early edema.   There are bilateral effusions present.   IMPRESSION AND  RECOMMENDATION:  1. Hypoxia, likely postop atelectasis, rule out pulmonary embolism      (doubtful), rule out congestive heart failure.  The patient does      not show signs or symptoms of an infection.  We will get CT      pulmonary arteries, CBC, and diff.  Continue oxygen.  We will give      scheduled nebs, flutter valve, and incentive spirometer.  We will      encourage ambulation as soon as possible.  Further recommendation      will be given after reviewing CT pulmonary artery.  We will also      check BNP.  2. Abnormal urinalysis.  We will repeat urinalysis and culture.      Mobolaji B. Maia Petties, M.D.     MBB/MEDQ  D:  12/20/2008  T:  12/20/2008  Job:  [id number]   cc:   Marily Memos, MD

## 2011-01-12 NOTE — Discharge Summary (Signed)
[name], [name]              ACCOUNT NO.:  [id number]   MEDICAL RECORD NO.:  [id number]          PATIENT TYPE:  INP   LOCATION:  5040                         FACILITY:  [facility]   PHYSICIAN:  Marily Memos, MD      DATE OF BIRTH:  1928-10-22   DATE OF ADMISSION:  01/16/2008  DATE OF DISCHARGE:  01/19/2008                               DISCHARGE SUMMARY   ADMITTING DIAGNOSIS:  Degenerative joint disease, right knee.   DISCHARGE DIAGNOSIS:  Degenerative joint disease, right knee.   COMPLICATIONS:  None.   INFECTIONS.:  None.   OPERATION:  Right total knee arthroplasty.   PERTINENT HISTORY:  This is a 77 year old black female followed in the  office for degenerative joint disease of her right knee.  The patient  had previous left total knee arthroplasty two years ago.  The patient  presently is having difficulty with her right knee with pain, stiffness,  and swelling.   The pertinent physical exam of her right knee, tender, +2 effusion,  crepitus medial and laterally.  Range of motion good, but limited genu  varum.  Neurovascular status is intact.   X-rays revealed loss of joint space, medial and lateral compartment with  sclerosis, and osteophyte.   HOSPITAL COURSE:  The patient had preop laboratory done.  CBC, EKG,  chest x-ray, PT, PTT, platelet count, C & S, and UA.  The patient's  laboratory was found to be stable enough to undergo surgery.  The  patient underwent right total knee arthroplasty.  Tolerated the  procedure quite well.  Postop course was fairly benign.  The patient has  had pre and postop IV antibiotics, prophylactic Coumadin, CPM, physical  therapy, partial weightbearing on the right side.  The patient's pain  was brought under control.  The patient did have blood loss anemia.  The  patient continued to be stable enough to be discharged and was  discharged on Percocet 1-2 q.4 h. p.r.n. for pain, Coumadin 5 mg daily,  H&H, home health and PT at home.  INRs  to be done as well and to return  to the office in 1 week.  The patient discharged in stable and  satisfactory condition.      Marily Memos, MD  Electronically Signed     AC/MEDQ  D:  02/15/2008  T:  02/16/2008  Job:  [id number]

## 2011-01-15 NOTE — Discharge Summary (Signed)
[name], [name]              ACCOUNT NO.:  [id number]   MEDICAL RECORD NO.:  [id number]          PATIENT TYPE:  INP   LOCATION:  [id number]                         FACILITY:  [facility]   PHYSICIAN:  Marily Memos, MD      DATE OF BIRTH:  12/20/1928   DATE OF ADMISSION:  03/02/2008  DATE OF DISCHARGE:  03/05/2008                               DISCHARGE SUMMARY   ADMITTING DIAGNOSIS:  Ruptured patellar tendon, right knee.   DISCHARGE DIAGNOSIS:  Ruptured patellar tendon, right knee.   COMPLICATIONS:  None.   INFECTIONS:  None.   OPERATION:  Repair of patellar tendon, right knee.   PERTINENT HISTORY:  This is a 77 year old female who recently had a left  total knee replacement and had been doing well up until she was in the  shower and the floor mat slid up underneath her causing her to fall.  The patient went down onto her right knee, developed severe pain,  diffuse swelling, and inability of the right knee.   Pertinent physical was that of the right knee, +3 effusion, tender,  palpable defect of the patellar tendon, lack of knee extension, and  neurovascular status is intact.   HOSPITAL COURSE:  The patient underwent preop laboratory CBC, EKG, chest  x-ray, CMET, and UA which were found to be stable for the patient to  undergo surgery.  The patient underwent right patellar tendon repair,  tolerated the procedure quite well, started ambulation with  weightbearing as tolerated with knee immobilizer on ice packs and  elevation.  The patient's pain was brought under control and was stable  enough to be discharged and was discharged on Percocet one to two q.4  p.r.n. pain, continue with knee immobilizer at all times, and  weightbearing as tolerated.  The patient was started back on Coumadin  therapy and was discharged in stable and satisfactory condition to  return to the office in 1 week.  The patient was discharged in stable  and satisfactory condition.      Marily Memos, MD  Electronically Signed     AC/MEDQ  D:  04/02/2008  T:  04/03/2008  Job:  [id number]

## 2011-01-15 NOTE — Op Note (Signed)
[name], [name]              ACCOUNT NO.:  [id number]   MEDICAL RECORD NO.:  [id number]          PATIENT TYPE:  AMB   LOCATION:  ENDO                         FACILITY:  [facility]   PHYSICIAN:  Tory Emerald. Benson [place], [place] OF BIRTH:  [birth year]   DATE OF PROCEDURE:  DATE OF DISCHARGE:                                 OPERATIVE REPORT   ENDOSCOPIST:  Tory Emerald. Benson Norway, MD   EQUIPMENT USED:  An Olympus adult colonoscope.   CONSENT:  Informed consent was obtained from the patient describing the  risks of bleeding of bleeding, infection, perforation, medication reactions,  and a 10%  miss rate for a small colon cancer and the risk of death, all of  which are not exclusive of any other complications that can occur.  Physical  assessment performed prior to the EGD procedure.   MEDICATIONS USED:  Versed 4 mg IV and Demerol 40 mg IV.   DESCRIPTION OF PROCEDURE:  After adequate sedation was achieved, the patient  was placed in the left lateral decubitus position and a rectal examination  was performed which was negative for any types of masses or other  abnormality.  The colonoscope was then inserted under direct visualization  to the cecum without difficulty.  Photo documentation was obtained of the  cecum.  There was marked difficulty intubating the terminal ileum; however,  this was achieved, and there was no evidence of any erosions, masses,  polyps, or ulcerations.  Upon withdrawal of the colonoscope and with careful  inspection of the cecum, ascending, transverse, descending, and sigmoid  colon, there was no evidence of any masses, polyps, ulcerations, erosions,  diverticula, or vascular abnormalities.  The patient was noted to have an  excellent prep with retroflexion of the endoscope in the rectum.  The  patient was noted to have mild external hemorrhoids.  Otherwise, no other  abnormalities were discerned.  The procedure was then terminated.  The  patient tolerated the procedure  well.  No complications were encountered.   PLAN:  Begin iron supplementation 325 mg p.o. t.i.d. for at least six  months, and the biopsies from the EGD will be conveyed to the patient.       PDH/MEDQ  D:  06/16/2004  T:  06/16/2004  Job:  [id number]   cc:   (along with EGD -  Work [id number]) Lyndle Herrlich.D.

## 2011-01-15 NOTE — Cardiovascular Report (Signed)
[place]. [facility]  Patient:    [name], [name]                     MRN: [id number] Proc. Date: 12/14/00 Adm. Date:  [id number] Attending:  Lavonna Monarch CC:         Elyn Peers, M.D.  [facility]   Cardiac Catheterization  DIAGNOSES:  Bilateral lower extremity claudication.  PROCEDURE:  Aortogram with bilateral lower extremity runoff arteriography.  SURGEON:  Gordy Clement, M.D.  ACCESS:  Right common femoral artery 5-French sheath.  CONTRAST:  160 ml Visipaque (due to generalized debilitation).  COMPLICATIONS:  None apparent.  CLINICAL NOTE:  This is a 77 year old female who was referred for evaluation of disabling claudication.  This more severely affects her left leg.  Mild symptoms in the right leg.  Doppler evaluation was consistent with aortoiliac disease and possible infrainguinal occlusive disease.  The patient was brought to the peripheral catheterization lab at this time for diagnostic arteriography and possible intervention.  PROCEDURE NOTE:  The patient was brought to the catheterization lab in stable condition.  Informed consent obtained.  Placed in the supine position, both groins prepped and draped in a sterile fashion.  Administered 2 mg of Nubain, 2 mg of Versed intravenously.  Skin and subcutaneous tissue in the right groin was infiltrated with 1% Xylocaine.  A needle easily introduced to the right common femoral artery; 0.035 J wire passed through the needle into the mid abdominal aorta under fluoroscopy.  A 5-French sheath advanced over the guidewire.  The dilator removed.  The sheath flushed with heparin and saline solution.  A standard pigtail catheter was advanced over the guidewire to the suprarenal position.  Standard AP mid abdominal aortogram obtained.  Bilateral single renal arteries were noted.  Mild stenosis at the origin of the renal arteries was present  bilaterally estimated to be approximately 30%.  There was mild tapering of the infrarenal aorta but no significant stenosis.  High aortic bifurcation was noted.  Long straight iliac vessels were noted bilaterally. The common iliac, external and internal iliac arteries were patent bilaterally.  The pigtail catheterization was brought down to the aortic bifurcation. Standard AP pelvic arteriogram was obtained.  This again revealed widely patent internal, external, and common iliac arteries bilaterally.  Left anterior oblique, right anterior oblique views were also obtained, and these failed to reveal any significant iliac artery occlusive disease.  Left lower extremity runoff arteriography obtained.  The right leg revealed widely patent common femoral artery.  The right superficial femoral artery revealed mild irregularity throughout its length without dominant stenosis. The right popliteal artery was patent.  Right tibial vessels revealed intact runoff.  Left lower extremity runoff revealed normal common femoral artery.  The left profunda femoris artery was widely patent.  The left superficial femoral artery was occluded at the adductor canal.  There was reconstitution of the popliteal artery in the suprapatellar position.  Irregularity of the popliteal artery was noted.  The distal popliteal artery was normal.  Left lower extremity runoff revealed intact anterior tibial and peroneal arteries.  The left posterior tibial artery was occluded just beyond its origin.  The patient tolerated the procedure well.  No apparent complications.  The J wire as reinserted, and the pigtail catheter and wire were removed.  Transferred to the holding area in stable condition for removal of right femoral sheath.  FINAL IMPRESSION: 1. Mild bilateral renal artery  stenosis. 2. Left superficial femoral artery occlusion. 3. 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  DISPOSITION:  These  results have been discussed with the patient and family. Followup appointment will be made for the office to discuss further therapeutic options. DD:  12/14/00 TD:  12/14/00 Job: [id number]

## 2011-01-15 NOTE — Op Note (Signed)
[name], Black              ACCOUNT NO.:  [id number]   MEDICAL RECORD NO.:  [id number]          PATIENT TYPE:  AMB   LOCATION:  ENDO                         FACILITY:  [facility]   PHYSICIAN:  Tory Emerald. Benson Norway, MD    DATE OF BIRTH:  1928-11-25   DATE OF PROCEDURE:  06/16/2004  DATE OF DISCHARGE:                                 OPERATIVE REPORT   PROCEDURE:  Esophagogastroduodenoscopy.   ENDOSCOPIST:  Tory Emerald. Benson Norway, M.D.   EQUIPMENT USED:  An adult Olympus endoscope.   INDICATIONS FOR PROCEDURE:  Iron-deficiency anemia.   CONSULTATIONS:  Informed consent was obtained from the patient describing  the risk of bleeding, infection, perforation, medication reaction, the risk  of perforation and death; all of which are not exclusive of any other  complications that can occur.   PHYSICAL EXAMINATION:  CARDIOVASCULAR:  Regular rate and rhythm.  LUNGS:  Clear to auscultation bilaterally.  ABDOMEN:  Obese, soft and nontender and nontender.   MEDICATIONS:  Versed 6 mg IV and Demerol 60 mg IV.   DESCRIPTION OF PROCEDURE:  After adequate sedation was achieved, the patient  was placed in the left lateral decubitus position and the endoscope was  advanced from the oral mucosa into the duodenum under direct visualization.  The duodenum appeared normal and there was no evidence of any masses,  ulcerations, inflammations or erosions.  Multiple random small biopsies were  obtained.  Upon entry into the gastric lumen, again, there is no evidence of  any abnormalities.  Retroflexion was negative for hiatal hernia or any  masses.  With straightening of the endoscope and retraction into the  esophagus, there is no evidence of any esophagitis or masses or other  ____________ bleeding lesions.  The procedure was then terminated and no  complications were encountered.  The patient tolerated the procedure well.   PLAN:  Follow up on biopsies.       PDH/MEDQ  D:  06/16/2004  T:  06/16/2004  Job:   [id number]

## 2011-01-15 NOTE — Procedures (Signed)
[place]. [facility]  Patient:    [name], [name]                     MRN: [id number] Proc. Date: 02/17/00 Adm. Date:  [id number] Disc. Date: [id number] Attending:  Juanita Craver CC:         Elyn Peers, M.D.                           Procedure Report  DATE OF BIRTH:  [birth year].  REFERRING PHYSICIAN:  Elyn Peers, M.D.  PROCEDURE PERFORMED:  Colonoscopy.  ENDOSCOPIST:  Nelwyn Salisbury, M.D.  INSTRUMENT USED:  Olympus video colonoscope.  INDICATION FOR PROCEDURE:  70 black female with a history of colon cancer in her family and personal history of polyps; rule out recurrent polyps.  PREPROCEDURE PREPARATION:  Informed consent was procured from the patient. The patient was fasted for eight hours prior to the procedure and prepped with a bottle of magnesium citrate and a gallon of NuLytely the night prior to the procedure.  PREPROCEDURE PHYSICAL:  VITAL SIGNS:  Patient had stable vital signs.  NECK:  Supple.  CHEST:  Clear to auscultation.  S1 and S2 regular.  ABDOMEN:  Soft with normal abdominal bowel sounds.  DESCRIPTION OF THE PROCEDURE:  The patient was placed in the left lateral decubitus position and sedated with Demerol 100 mg and Versed 3 mg intravenously.  Once the patient was adequately sedate and maintained on low-flow oxygen and continuous cardiac monitoring, the Olympus video colonoscope was advanced from the rectum to the cecum without difficulty.  No masses, polyps, erosions, ulcerations or diverticula were seen.  The patient tolerated the procedure well without complication.  IMPRESSION:  Normal colonoscopy.  RECOMMENDATIONS:  Repeat colonoscopy is recommended in the next five years or earlier if the patient were to develop any symptoms in the interim, considering her family history of colon cancer.DD:  02/17/00 TD:  02/19/00 Job: [id number]

## 2011-01-15 NOTE — Op Note (Signed)
[place]. [facility]  Patient:    [name], [name] Visit Number: [id number] MRN: [id number]          Service Type: DSU Location: [facility] 2899 [id number] Attending Physician:  Mayme Genta Dictated by:   Sharmon Revere, M.D. Proc. Date: 05/23/01 Admit Date:  05/23/2001 Discharge Date: 05/23/2001                             Operative Report  PREOPERATIVE DIAGNOSIS:  Ganglion cyst of right wrist.  POSTOPERATIVE DIAGNOSIS:  Ganglion cyst of right wrist.  PROCEDURE:  Excision of ganglion cyst, right wrist.  ANESTHESIA:  Bier block.  SURGEON:  Sharmon Revere, M.D.  DESCRIPTION OF PROCEDURE:  The patient was taken to the operating room and given Bier block.  The right hand was prepped with DuraPrep and draped in a sterile manner.  Tourniquet and bipolar used for hemostasis.  A transverse incision was made over the cystic lesion on to the hand.  Sharp and blunt dissection was made down along the cystic lesion.  The lesion was completely resected off of the metacarpal joints.  Coagulation of the area of the bed was done to attempt to avoid recurrence of the lesion.  The lesion was excised in toto.  Copious irrigation was then done followed by wound closure with 4-0 nylon.  A compressive dressing applied and wrist splint applied.  The patient tolerated the procedure quite well and went to the recovery room in stable and satisfactory condition.  The patient is being discharged home with ice packs, elevation, and Percocet 5 mg q.4h. p.r.n. for pain, and to return to our office in one week.  The patient is being discharged in stable and satisfactory condition. Dictated by:   Sharmon Revere, M.D. Attending Physician:  Mayme Genta DD:  05/23/01 TD:  05/23/01 Job: [id number]

## 2011-01-15 NOTE — Discharge Summary (Signed)
[name], [name]              ACCOUNT NO.:  [id number]   MEDICAL RECORD NO.:  [id number]          PATIENT TYPE:  INP   LOCATION:  5034                         FACILITY:  [facility]   PHYSICIAN:  Marily Memos, MD      DATE OF BIRTH:  1929/03/16   DATE OF ADMISSION:  09/30/2005  DATE OF DISCHARGE:  10/05/2005                                 DISCHARGE SUMMARY   ADMISSION DIAGNOSES:  1.  Degenerative arthropathy, left knee.  2.  History of hypertension.   DISCHARGE DIAGNOSES:  1.  Degenerative arthropathy, left knee.  2.  History of hypertension.   COMPLICATIONS:  None.   OPERATION:  Left total knee arthroplasty.   PERTINENT HISTORY:  This is a 77 year old female followed in the office for  degenerative arthropathy who over the past year has had progressive  worsening of symptoms of pain both at rest as well as with activity,  catching, and progressive loss of range of motion in the left knee.  The  patient has been treated with antiinflammatories and pain medications, use  of cane for ambulation.   PERTINENT PHYSICAL EXAMINATION:  Left knee with crepitus plus effusion.  Range of motion was limited.  Negative Homan test.  Genu varum.  X-rays  demonstrated loss of joint space with sclerosis of osteophytes.   HOSPITAL COURSE:  The patient had preoperative medical evaluation and was  found to be stable for surgery.  The patient also had preoperative  laboratories, CBC, ECG, CMET, PT, PTT, platelet count, UA.  These were found  to be stable, and the patient underwent surgery.  The patient underwent left  total knee arthroplasty on September 30, 2005, and tolerated the procedure  quite well.  The postoperative course was fairly benign.  The patient was  discharged ambulatory with positive weightbearing on the left side.  The  patient did receive pre- and postoperative IV antibiotics, Coumadin therapy,  physical therapy, occupational therapy, and use of CPM.  The patient  progressed  quite well during her hospital stay.  The patient did have  postoperative anemia from surgery.  The patient was stable for discharge and  was discharged with home health PT arrangement, INR weekly, Coumadin  continued for 2 weeks, Percocet for pain, Feosol 300 mg b.i.d. and to return  to the office in 1 week.  The patient was discharged in stable and  satisfactory condition.      Marily Memos, MD  Electronically Signed     AC/MEDQ  D:  11/23/2005  T:  11/24/2005  Job:  [id number]

## 2011-01-15 NOTE — H&P (Signed)
[place]  Patient:    [name], [name] Visit Number: [id number] MRN: [id number]          Service Type: DSU Location: [facility] 2899 24 Attending Physician:  Mayme Genta Dictated by:   Sharmon Revere, M.D. Admit Date:  05/23/2001 Discharge Date: 05/23/2001                           History and Physical  CHIEF COMPLAINT:  Painful growth, right wrist.  HISTORY OF PRESENT ILLNESS:  This is a 77 year old female who had been seen in the office for recurrent ganglion cyst involving the right wrist.  The patient has had aspiration done, but still recurrence and enlargement of the cyst.  PAST SURGICAL HISTORY:  Colonoscopy in 2001, removal of cyst of right breast, tubal ligation, ORIF of left ankle.  ALLERGIES:  No known drug allergies.  MEDICATIONS:  Diovan, HCTZ, alprazolam, Plavix, Lipitor, Lasix, Lotrel, and Lodine XL.  PAST MEDICAL HISTORY:  High blood pressure and regular physician, Dr. Lucianne Lei.  HABITS:  Occasionally use of social drink of alcohol.  FAMILY HISTORY:  Noncontributory.  REVIEW OF SYSTEMS:  The patient ________________.  PHYSICAL EXAMINATION:  HEENT:  ___________.  EXTREMITIES:  There are cystic lesions on the dorsal aspect of the wrist and tender along the extensor tendon, ulnar deviation, and a rheumatoid disorder of the fingers.  Neurovascular status intact.  IMPRESSION:  Ganglion cyst, right wrist. Dictated by:   Sharmon Revere, M.D. Attending Physician:  Mayme Genta DD:  05/23/01 TD:  05/23/01 Job: [id number]

## 2011-01-15 NOTE — Op Note (Signed)
[name], [name]              ACCOUNT NO.:  [id number]   MEDICAL RECORD NO.:  [id number]          PATIENT TYPE:  INP   LOCATION:  2550                         FACILITY:  [place]   PHYSICIAN:  Marily Memos, MD      DATE OF BIRTH:  12-22-1928   DATE OF PROCEDURE:  09/30/2005  DATE OF DISCHARGE:                                 OPERATIVE REPORT   PREOPERATIVE DIAGNOSIS:  Degenerative arthritis of left knee.   POSTOPERATIVE DIAGNOSIS:  Degenerative arthritis of left knee.   PROCEDURE:  Left total knee arthroplasty using Biomet system.   SURGEON:  Marily Memos, MD   ANESTHESIA:  General.   DESCRIPTION OF PROCEDURE:  The patient was taken to the operating room after  giving adequate premedication and given general anesthesia and intubated.  The left knee was prepped with DuraPrep and draped in the a sterile manner.  A tourniquet and Bovie were used for hemostasis.  An anterior midline  incision was made over the left knee, going through the skin and  subcutaneous tissue down to the fascia.  A medial paramedian incision was  made into the capsule extending from the tibial tubercle up to the  quadriceps.  The patella was reflected laterally.  Soft tissue resection was  then done.  Osteophytes about the femur, tibia and patella were done.  The  tibial cutting jig was put in place and 10 mm of tibial surface were  resected.  Reaming down the femoral canal was then done.  With placement of  the distal femoral cutting jig at 60 degrees of valgus, distal femoral  cutting was then done followed by sizing of the jig, which was found to be  65 mm.  Anterior and posterior and chamfering cutting jig, 65 mm, was then  put in place and anterior and posterior cuts as well as chamfering cuts  done.  Other soft tissue resection was then further done.  The femoral  component, 65-mm trial component, was put in place and found to be a very  good fit.  Next, attention was turned back to the tibia.  A  tibial sizing  popsicle stick was used and found to be 71 mm to cover the tibial surface  quite well.  With a 71-mm poly, the joint was too tight.  Further cutting  resection of the tibial surface was done by replacing the tibial cutting  jig.  Trial poly was then put in place; very good full extension, full  flexion, good medial and lateral stability were demonstrated.  With the  trial put in place and external alignment rod put in place, marking of the  tibia was then done with the Bovie.  Next, the tibial cutting jig of 71 mm  was put in place, stabilized with 2 nails and appropriate cuts made.  Trial  components were then put in place and a 14-mm was found to be very good and  stable, a 14 x 71 poly with full extension, full flexion, good medial and  lateral stability.  Next, attention was turned to the patella, which was  sized a large  37-mm and appropriate cutting jig for the patella was put in  place.  All three trial components were put in place.  No lateral  subluxation of the patella was noted, full extension, full flexion, good  medial and lateral stability, good tracking of the patella.  Next,  irrigation was done with the Simpulse and methyl methacrylate was mixed.  The patella and tibial components were cemented.  The femoral component was  pressfitted.  After setting of the cement, tibial poly trial was then put in  place again and the 14-mm had the best stability.  Final poly with a key was  then put in place, tourniquet was let down, hemostasis obtained.  Further  irrigation was done.  Again, range of motion was full extension, full  flexion, good medial and lateral stability, good tracking of the patella.  Wound closure was then done with 0 Vicryl for the fascia, 2-0 for the  subcutaneous and skin staples for the skin.  A bulky compressive dressing  was applied and knee immobilizer applied.  The patient tolerated the  procedure quite well and went to the recovery room in  stable and  satisfactory condition.      Marily Memos, MD  Electronically Signed     AC/MEDQ  D:  09/30/2005  T:  09/30/2005  Job:  [id number]

## 2011-05-24 LAB — I-STAT 8, (EC8 V) (CONVERTED LAB)
Acid-Base Excess: 1
BUN: 17
Bicarbonate: 24.4 — ABNORMAL HIGH
HCT: 44
Hemoglobin: 15
Operator id: 146091
Sodium: 139
TCO2: 25
pCO2, Ven: 36.1 — ABNORMAL LOW

## 2011-05-24 LAB — POCT I-STAT CREATININE: Creatinine, Ser: 0.9

## 2011-05-26 LAB — BASIC METABOLIC PANEL
Calcium: 8.4
Calcium: 9.4
Creatinine, Ser: 1.07
GFR calc Af Amer: 58 — ABNORMAL LOW
GFR calc Af Amer: >60
GFR calc non Af Amer: 48 — ABNORMAL LOW
Potassium: 4.5
Sodium: 133 — ABNORMAL LOW
Sodium: 141

## 2011-05-26 LAB — TYPE AND SCREEN: ABO/RH(D): A POS

## 2011-05-26 LAB — CBC
HCT: 40.2
HCT: 26.4 — ABNORMAL LOW
HCT: 29.4 — ABNORMAL LOW
Hemoglobin: 12.9
Hemoglobin: 8.8 — ABNORMAL LOW
Hemoglobin: 9.9 — ABNORMAL LOW
MCHC: 32.2
MCV: 80.8
MCV: 80.8
Platelets: 156
Platelets: 109 — ABNORMAL LOW
RBC: 3.27 — ABNORMAL LOW
RBC: 3.65 — ABNORMAL LOW
RDW: 14.7
WBC: 8
WBC: 9.2

## 2011-05-26 LAB — PROTIME-INR
INR: 1
INR: 1.1
INR: 2 — ABNORMAL HIGH
Prothrombin Time: 13.1
Prothrombin Time: 23.5 — ABNORMAL HIGH

## 2011-05-26 LAB — URINALYSIS, ROUTINE W REFLEX MICROSCOPIC
Hgb urine dipstick: NEGATIVE
Nitrite: NEGATIVE
Protein, ur: NEGATIVE
Specific Gravity, Urine: 1.024
Urobilinogen, UA: 0.2

## 2011-05-26 LAB — URINE MICROSCOPIC-ADD ON

## 2011-05-26 LAB — COMPREHENSIVE METABOLIC PANEL
Albumin: 4
Alkaline Phosphatase: 70
BUN: 28 — ABNORMAL HIGH
Calcium: 9.6
Creatinine, Ser: 1.39 — ABNORMAL HIGH
Glucose, Bld: 90
Total Protein: 7.3

## 2011-05-26 LAB — APTT: aPTT: 31

## 2011-05-27 LAB — COMPREHENSIVE METABOLIC PANEL
ALT: 11
Alkaline Phosphatase: 61
Chloride: 101
Glucose, Bld: 104 — ABNORMAL HIGH
Potassium: 4.1
Sodium: 138
Total Bilirubin: 0.6
Total Protein: 7.2

## 2011-05-27 LAB — PROTIME-INR
INR: 1
INR: 1.1
INR: 1.2
Prothrombin Time: 13.1
Prothrombin Time: 15.2

## 2011-05-27 LAB — CBC
Hemoglobin: 11 — ABNORMAL LOW
RBC: 4.43
RDW: 15.1
WBC: 6.6

## 2011-06-15 ENCOUNTER — Ambulatory Visit
Admission: RE | Admit: 2011-06-15 | Discharge: 2011-06-15 | Disposition: A | Discharge status: Home / Self Care | Payer: Medicare Other | Source: Ambulatory Visit | Attending: Orthopedic Surgery | Admitting: Orthopedic Surgery

## 2011-06-15 ENCOUNTER — Other Ambulatory Visit: Payer: Self-pay | Admitting: Orthopedic Surgery

## 2011-06-15 DIAGNOSIS — IMO0002 Reserved for concepts with insufficient information to code with codable children: Secondary | ICD-10-CM

## 2011-06-29 ENCOUNTER — Other Ambulatory Visit: Payer: Self-pay | Admitting: Orthopedic Surgery

## 2011-06-29 DIAGNOSIS — M5136 Other intervertebral disc degeneration, lumbar region: Secondary | ICD-10-CM

## 2011-07-07 ENCOUNTER — Other Ambulatory Visit: Payer: Medicare Other

## 2011-07-07 ENCOUNTER — Ambulatory Visit
Admission: RE | Admit: 2011-07-07 | Discharge: 2011-07-07 | Disposition: A | Discharge status: Home / Self Care | Payer: Medicare Other | Source: Ambulatory Visit | Attending: Orthopedic Surgery | Admitting: Orthopedic Surgery

## 2011-07-07 DIAGNOSIS — M5136 Other intervertebral disc degeneration, lumbar region: Secondary | ICD-10-CM

## 2011-11-11 ENCOUNTER — Other Ambulatory Visit: Payer: Self-pay | Admitting: Family Medicine

## 2011-11-11 DIAGNOSIS — Z1231 Encounter for screening mammogram for malignant neoplasm of breast: Secondary | ICD-10-CM

## 2011-11-12 ENCOUNTER — Ambulatory Visit
Admission: RE | Admit: 2011-11-12 | Discharge: 2011-11-12 | Disposition: A | Discharge status: Home / Self Care | Payer: Medicare Other | Source: Ambulatory Visit | Attending: Family Medicine | Admitting: Family Medicine

## 2011-11-12 DIAGNOSIS — Z1231 Encounter for screening mammogram for malignant neoplasm of breast: Secondary | ICD-10-CM

## 2012-04-07 ENCOUNTER — Encounter (HOSPITAL_COMMUNITY): Payer: Self-pay | Admitting: Emergency Medicine

## 2012-04-07 ENCOUNTER — Emergency Department (HOSPITAL_COMMUNITY): Payer: Medicare Other

## 2012-04-07 ENCOUNTER — Emergency Department (HOSPITAL_COMMUNITY)
Admission: EM | Admit: 2012-04-07 | Discharge: 2012-04-07 | Disposition: A | Discharge status: Home / Self Care | Payer: Medicare Other | Attending: Emergency Medicine | Admitting: Emergency Medicine

## 2012-04-07 DIAGNOSIS — Y92009 Unspecified place in unspecified non-institutional (private) residence as the place of occurrence of the external cause: Secondary | ICD-10-CM | POA: Insufficient documentation

## 2012-04-07 DIAGNOSIS — M25569 Pain in unspecified knee: Secondary | ICD-10-CM | POA: Insufficient documentation

## 2012-04-07 DIAGNOSIS — M79606 Pain in leg, unspecified: Secondary | ICD-10-CM

## 2012-04-07 DIAGNOSIS — Z79899 Other long term (current) drug therapy: Secondary | ICD-10-CM | POA: Insufficient documentation

## 2012-04-07 DIAGNOSIS — W07XXXA Fall from chair, initial encounter: Secondary | ICD-10-CM | POA: Insufficient documentation

## 2012-04-07 DIAGNOSIS — R51 Headache: Secondary | ICD-10-CM | POA: Insufficient documentation

## 2012-04-07 DIAGNOSIS — M25559 Pain in unspecified hip: Secondary | ICD-10-CM | POA: Insufficient documentation

## 2012-04-07 DIAGNOSIS — M542 Cervicalgia: Secondary | ICD-10-CM | POA: Insufficient documentation

## 2012-04-07 DIAGNOSIS — E7800 Pure hypercholesterolemia, unspecified: Secondary | ICD-10-CM | POA: Insufficient documentation

## 2012-04-07 DIAGNOSIS — M79609 Pain in unspecified limb: Secondary | ICD-10-CM | POA: Insufficient documentation

## 2012-04-07 DIAGNOSIS — I10 Essential (primary) hypertension: Secondary | ICD-10-CM | POA: Insufficient documentation

## 2012-04-07 HISTORY — DX: Pure hypercholesterolemia, unspecified: E78.00

## 2012-04-07 LAB — BASIC METABOLIC PANEL
CO2: 24 mEq/L (ref 19–32)
Chloride: 107 mEq/L (ref 96–112)
Glucose, Bld: 120 mg/dL — ABNORMAL HIGH (ref 70–99)
Sodium: 142 mEq/L (ref 135–145)

## 2012-04-07 LAB — CBC WITH DIFFERENTIAL/PLATELET
Eosinophils Absolute: 0 10*3/uL (ref 0.0–0.7)
Eosinophils Relative: 0 % (ref 0–5)
HCT: 40.1 % (ref 36.0–46.0)
Hemoglobin: 12.3 g/dL (ref 12.0–15.0)
Lymphocytes Relative: 17 % (ref 12–46)
Lymphs Abs: 1.2 10*3/uL (ref 0.7–4.0)
MCH: 26 pg (ref 26.0–34.0)
MCV: 84.8 fL (ref 78.0–100.0)
Monocytes Absolute: 0.1 10*3/uL (ref 0.1–1.0)
Monocytes Relative: 1 % — ABNORMAL LOW (ref 3–12)
RBC: 4.73 MIL/uL (ref 3.87–5.11)
WBC: 7.1 10*3/uL (ref 4.0–10.5)

## 2012-04-07 MED ORDER — OXYCODONE-ACETAMINOPHEN 5-325 MG PO TABS
2.0000 | ORAL_TABLET | Freq: Once | ORAL | Status: AC
  Administered 2012-04-07: 2 via ORAL
  Filled 2012-04-07: qty 2

## 2012-04-07 NOTE — ED Notes (Signed)
Dr. Jeanell Sparrow aware patient c/o pain in right leg. States will review results and order pain medication.

## 2012-04-07 NOTE — ED Notes (Addendum)
Pt c/o right leg pain after tripping and falling last night; pt sts ambulatory after fall but having pain this am; pt sts did hit head but denies LOC; pt denies any blood thinners; pt sts some head pain

## 2012-04-07 NOTE — ED Provider Notes (Signed)
History     CSN: [id number]  Arrival date & time 04/07/12  [id number]   First MD Initiated Contact with Patient 04/07/12 [phone]      Chief Complaint  Patient presents with  . Fall  . Leg Pain    (Consider location/radiation/quality/duration/timing/severity/associated sxs/prior treatment) HPI Patient fell last night striking her head but having no loss of consciousness. She complains of pain to the right lower extremity. She was able to get up and use her walker to move about with her daughter's assistance. She continues to have pain in the right lower extremity and comes in today for evaluation of this. She denies being on any blood thinners. She does not have any focal neurological deficits. She denies any other injury. She denies any loss of consciousness or symptoms prior to the fall. She was in a rolling chair that rolled out from underneath her. Past Medical History  Diagnosis Date  . Hypertension   . Hypercholesteremia     History reviewed. No pertinent past surgical history.  History reviewed. No pertinent family history.  History  Substance Use Topics  . Smoking status: Never Smoker   . Smokeless tobacco: Not on file  . Alcohol Use: Yes     occasional    OB History    Grav Para Term Preterm Abortions TAB SAB Ect Mult Living                  Review of Systems  All other systems reviewed and are negative.    Allergies  Niaspan  Home Medications   Current Outpatient Rx  Name Route Sig Dispense Refill  . ALPRAZOLAM 0.5 MG PO TABS Oral Take 0.5 mg by mouth 3 (three) times daily as needed. For anxiety    . AMLODIPINE BESYLATE 10 MG PO TABS Oral Take 10 mg by mouth daily.    . ASPIRIN EC 81 MG PO TBEC Oral Take 81 mg by mouth daily.    Marland Kitchen HYDROCODONE-ACETAMINOPHEN 10-650 MG PO TABS Oral Take 1 tablet by mouth every 6 (six) hours as needed. pain    . ADULT MULTIVITAMIN W/MINERALS CH Oral Take 1 tablet by mouth daily.    Marland Kitchen OLMESARTAN MEDOXOMIL-HCTZ 40-25 MG PO TABS  Oral Take 1 tablet by mouth daily.    Marland Kitchen PREDNISONE 5 MG PO TABS Oral Take 5 mg by mouth daily.    Marland Kitchen ROSUVASTATIN CALCIUM 20 MG PO TABS Oral Take 20 mg by mouth daily.    Marland Kitchen SPIRONOLACTONE 100 MG PO TABS Oral Take 100 mg by mouth daily.      BP 163/37  Pulse 69  Temp 98.5 F (36.9 C) (Oral)  Resp 16  SpO2 97%  Physical Exam  Nursing note and vitals reviewed. Constitutional: She appears well-developed and well-nourished.  HENT:  Head: Normocephalic and atraumatic.  Eyes: Conjunctivae and EOM are normal. Pupils are equal, round, and reactive to light.  Neck: Normal range of motion. Neck supple.  Cardiovascular: Normal rate, regular rhythm, normal heart sounds and intact distal pulses.   Pulmonary/Chest: Effort normal and breath sounds normal.  Abdominal: Soft. Bowel sounds are normal.  Musculoskeletal: Normal range of motion.       Tenderness mid right calf. Swelling of her right knee but full active range of motion. Ankle is nontender. There is some lateral tenderness over the right hip the patient has full active range of motion. Dorsal pedalis pulses are intact and sensation is intact throughout the lower extremity to  Spine is nontender  in the thoracic and lumbar area. There is some paracervical tenderness.  Neurological: She is alert.  Skin: Skin is warm and dry.  Psychiatric: She has a normal mood and affect. Thought content normal.    ED Course  Procedures (including critical care time)  Labs Reviewed  CBC WITH DIFFERENTIAL - Abnormal; Notable for the following:    Neutrophils Relative 82 (*)     Monocytes Relative 1 (*)     All other components within normal limits  BASIC METABOLIC PANEL   Dg Hip Complete Right  04/07/2012  *RADIOLOGY REPORT*  Clinical Data:  Hip pain.  Fall  RIGHT HIP - COMPLETE 2+ VIEW  Comparison: None  Findings: There is no evidence of fracture or dislocation.  There is no evidence of arthropathy or other focal bone abnormality. Soft tissues are  unremarkable.  IMPRESSION: Negative exam.  Original Report Authenticated By: Angelita Ingles, M.D.   Dg Tibia/fibula Right  04/07/2012  *RADIOLOGY REPORT*  Clinical Data: Fall.  Knee pain.  RIGHT TIBIA AND FIBULA - 2 VIEW  Comparison: None.  Findings: No evidence for acute fracture in the tibia or fibula. No worrisome lytic or sclerotic osseous abnormality.  The patient is status post tricompartmental knee replacement with patella alta, as seen previously.  IMPRESSION: No acute bony findings.  Original Report Authenticated By: ERIC A. MANSELL, M.D.   Ct Head Wo Contrast  04/07/2012  *RADIOLOGY REPORT*  Clinical Data:  Fall.  Leg pain. Occipital trauma.  Occipital headache.  Neck pain.  CT HEAD WITHOUT CONTRAST CT CERVICAL SPINE WITHOUT CONTRAST  Technique:  Multidetector CT imaging of the head and cervical spine was performed following the standard protocol without intravenous contrast.  Multiplanar CT image reconstructions of the cervical spine were also generated.  Comparison:  11/15/2007 head CT.  CT HEAD  Findings: There is mild soft tissue stranding in the superior neck on the left subcutaneous tissues.  This may be post-traumatic contusion. No mass lesion, mass effect, midline shift, hydrocephalus, hemorrhage.  No territorial ischemia or acute infarction.  Mild hyperostosis frontalis interna.  No skull fracture.  Mastoid air cells are clear.  Paranasal sinuses are clear.  Intracranial atherosclerosis.  IMPRESSION: No acute intracranial abnormality.  Soft tissue stranding in the posterior left neck may be post-traumatic contusion.  CT CERVICAL SPINE  Findings: Cervical spinal alignment is near anatomic with a slight loss of the upper cervical lordosis.   No central stenosis. Multilevel uncovertebral spurring is present with foraminal encroachment in the mid cervical spine.  Paraspinal soft tissues are within normal limits.  Lung apices appear normal.  There is a locule of gas adjacent to the left side of  the esophagus, which may represent a small tracheal or esophageal diverticulum. Craniocervical alignment normal. Facet degenerative changes are present, most pronounced on the left at C4-C5.  IMPRESSION: No acute osseous abnormality.  Mild to moderate cervical spondylosis and facet arthrosis.  Original Report Authenticated By: Dereck Ligas, M.D.   Ct Cervical Spine Wo Contrast  04/07/2012  *RADIOLOGY REPORT*  Clinical Data:  Fall.  Leg pain. Occipital trauma.  Occipital headache.  Neck pain.  CT HEAD WITHOUT CONTRAST CT CERVICAL SPINE WITHOUT CONTRAST  Technique:  Multidetector CT imaging of the head and cervical spine was performed following the standard protocol without intravenous contrast.  Multiplanar CT image reconstructions of the cervical spine were also generated.  Comparison:  11/15/2007 head CT.  CT HEAD  Findings: There is mild soft tissue stranding in the superior  neck on the left subcutaneous tissues.  This may be post-traumatic contusion. No mass lesion, mass effect, midline shift, hydrocephalus, hemorrhage.  No territorial ischemia or acute infarction.  Mild hyperostosis frontalis interna.  No skull fracture.  Mastoid air cells are clear.  Paranasal sinuses are clear.  Intracranial atherosclerosis.  IMPRESSION: No acute intracranial abnormality.  Soft tissue stranding in the posterior left neck may be post-traumatic contusion.  CT CERVICAL SPINE  Findings: Cervical spinal alignment is near anatomic with a slight loss of the upper cervical lordosis.   No central stenosis. Multilevel uncovertebral spurring is present with foraminal encroachment in the mid cervical spine.  Paraspinal soft tissues are within normal limits.  Lung apices appear normal.  There is a locule of gas adjacent to the left side of the esophagus, which may represent a small tracheal or esophageal diverticulum. Craniocervical alignment normal. Facet degenerative changes are present, most pronounced on the left at C4-C5.   IMPRESSION: No acute osseous abnormality.  Mild to moderate cervical spondylosis and facet arthrosis.  Original Report Authenticated By: Dereck Ligas, M.D.   Dg Knee Complete 4 Views Right  04/07/2012  *RADIOLOGY REPORT*  Clinical Data: Fall.  Knee pain.  RIGHT KNEE - COMPLETE 4+ VIEW  Comparison: 09/27/2008  Findings: The patient is status post tricompartmental knee replacement.  No evidence for fracture. The patella is again noted to be high riding.  No evidence for hardware loosening.  No worrisome lytic or sclerotic osseous abnormality.  IMPRESSION: Stable exam.  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  Original Report Authenticated By: ERIC A. MANSELL, M.D.     No diagnosis found.     Date: 04/07/2012  Rate: 66  Rhythm: normal sinus rhythm  QRS Axis: normal  Intervals: normal  ST/T Wave abnormalities: nonspecific ST/T changes  Conduction Disutrbances:none  Narrative Interpretation:   Old EKG Reviewed: unchanged  MDM  Dg Hip Complete Right  04/07/2012  *RADIOLOGY REPORT*  Clinical Data:  Hip pain.  Fall  RIGHT HIP - COMPLETE 2+ VIEW  Comparison: None  Findings: There is no evidence of fracture or dislocation.  There is no evidence of arthropathy or other focal bone abnormality. Soft tissues are unremarkable.  IMPRESSION: Negative exam.  Original Report Authenticated By: Angelita Ingles, M.D.   Dg Tibia/fibula Right  04/07/2012  *RADIOLOGY REPORT*  Clinical Data: Fall.  Knee pain.  RIGHT TIBIA AND FIBULA - 2 VIEW  Comparison: None.  Findings: No evidence for acute fracture in the tibia or fibula. No worrisome lytic or sclerotic osseous abnormality.  The patient is status post tricompartmental knee replacement with patella alta, as seen previously.  IMPRESSION: No acute bony findings.  Original Report Authenticated By: ERIC A. MANSELL, M.D.   Ct Head Wo Contrast  04/07/2012  *RADIOLOGY REPORT*  Clinical  Data:  Fall.  Leg pain. Occipital trauma.  Occipital headache.  Neck pain.  CT HEAD WITHOUT CONTRAST CT CERVICAL SPINE WITHOUT CONTRAST  Technique:  Multidetector CT imaging of the head and cervical spine was performed following the standard protocol without intravenous contrast.  Multiplanar CT image reconstructions of the cervical spine were also generated.  Comparison:  11/15/2007 head CT.  CT HEAD  Findings: There is mild soft tissue stranding in the superior neck on the left subcutaneous tissues.  This may be post-traumatic contusion. No mass lesion, mass effect, midline shift, hydrocephalus, hemorrhage.  No territorial ischemia or acute infarction.  Mild hyperostosis  frontalis interna.  No skull fracture.  Mastoid air cells are clear.  Paranasal sinuses are clear.  Intracranial atherosclerosis.  IMPRESSION: No acute intracranial abnormality.  Soft tissue stranding in the posterior left neck may be post-traumatic contusion.  CT CERVICAL SPINE  Findings: Cervical spinal alignment is near anatomic with a slight loss of the upper cervical lordosis.   No central stenosis. Multilevel uncovertebral spurring is present with foraminal encroachment in the mid cervical spine.  Paraspinal soft tissues are within normal limits.  Lung apices appear normal.  There is a locule of gas adjacent to the left side of the esophagus, which may represent a small tracheal or esophageal diverticulum. Craniocervical alignment normal. Facet degenerative changes are present, most pronounced on the left at C4-C5.  IMPRESSION: No acute osseous abnormality.  Mild to moderate cervical spondylosis and facet arthrosis.  Original Report Authenticated By: Dereck Ligas, M.D.   Ct Cervical Spine Wo Contrast  04/07/2012  *RADIOLOGY REPORT*  Clinical Data:  Fall.  Leg pain. Occipital trauma.  Occipital headache.  Neck pain.  CT HEAD WITHOUT CONTRAST CT CERVICAL SPINE WITHOUT CONTRAST  Technique:  Multidetector CT imaging of the head and cervical  spine was performed following the standard protocol without intravenous contrast.  Multiplanar CT image reconstructions of the cervical spine were also generated.  Comparison:  11/15/2007 head CT.  CT HEAD  Findings: There is mild soft tissue stranding in the superior neck on the left subcutaneous tissues.  This may be post-traumatic contusion. No mass lesion, mass effect, midline shift, hydrocephalus, hemorrhage.  No territorial ischemia or acute infarction.  Mild hyperostosis frontalis interna.  No skull fracture.  Mastoid air cells are clear.  Paranasal sinuses are clear.  Intracranial atherosclerosis.  IMPRESSION: No acute intracranial abnormality.  Soft tissue stranding in the posterior left neck may be post-traumatic contusion.  CT CERVICAL SPINE  Findings: Cervical spinal alignment is near anatomic with a slight loss of the upper cervical lordosis.   No central stenosis. Multilevel uncovertebral spurring is present with foraminal encroachment in the mid cervical spine.  Paraspinal soft tissues are within normal limits.  Lung apices appear normal.  There is a locule of gas adjacent to the left side of the esophagus, which may represent a small tracheal or esophageal diverticulum. Craniocervical alignment normal. Facet degenerative changes are present, most pronounced on the left at C4-C5.  IMPRESSION: No acute osseous abnormality.  Mild to moderate cervical spondylosis and facet arthrosis.  Original Report Authenticated By: Dereck Ligas, M.D.   Dg Knee Complete 4 Views Right  04/07/2012  *RADIOLOGY REPORT*  Clinical Data: Fall.  Knee pain.  RIGHT KNEE - COMPLETE 4+ VIEW  Comparison: 09/27/2008  Findings: The patient is status post tricompartmental knee replacement.  No evidence for fracture. The patella is again noted to be high riding.  No evidence for hardware loosening.  No worrisome lytic or sclerotic osseous abnormality.  IMPRESSION: Stable exam.  Patella alta with stable discontinuity of the patellar  tendon.  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  Original Report Authenticated By: ERIC A. MANSELL, M.D.     Patient with lower extremity pain but no acute fracture.  Patient has knee rolling walker and is followed by Dr. Eulas Post.  Daughter lives with patient and is here.  They will assist patient and she will follow up with Dr. Eulas Post if pain continues.      Shaune Pollack, MD 04/10/12 [phone]

## 2013-02-01 ENCOUNTER — Other Ambulatory Visit: Payer: Self-pay | Admitting: Orthopedic Surgery

## 2013-02-01 ENCOUNTER — Ambulatory Visit
Admission: RE | Admit: 2013-02-01 | Discharge: 2013-02-01 | Disposition: A | Discharge status: Home / Self Care | Payer: Medicare Other | Source: Ambulatory Visit | Attending: Orthopedic Surgery | Admitting: Orthopedic Surgery

## 2013-02-01 DIAGNOSIS — M5430 Sciatica, unspecified side: Secondary | ICD-10-CM

## 2013-02-13 ENCOUNTER — Other Ambulatory Visit: Payer: Self-pay | Admitting: Orthopedic Surgery

## 2013-02-13 DIAGNOSIS — M545 Low back pain: Secondary | ICD-10-CM

## 2013-02-21 ENCOUNTER — Ambulatory Visit
Admission: RE | Admit: 2013-02-21 | Discharge: 2013-02-21 | Disposition: A | Discharge status: Home / Self Care | Payer: Medicare Other | Source: Ambulatory Visit | Attending: Orthopedic Surgery | Admitting: Orthopedic Surgery

## 2013-02-21 DIAGNOSIS — M545 Low back pain: Secondary | ICD-10-CM

## 2013-05-10 ENCOUNTER — Other Ambulatory Visit: Payer: Self-pay | Admitting: Orthopedic Surgery

## 2013-05-10 DIAGNOSIS — M549 Dorsalgia, unspecified: Secondary | ICD-10-CM

## 2013-05-11 ENCOUNTER — Other Ambulatory Visit: Payer: Self-pay | Admitting: Nephrology

## 2013-05-17 ENCOUNTER — Ambulatory Visit
Admission: RE | Admit: 2013-05-17 | Discharge: 2013-05-17 | Disposition: A | Discharge status: Home / Self Care | Payer: Medicare Other | Source: Ambulatory Visit | Attending: Orthopedic Surgery | Admitting: Orthopedic Surgery

## 2013-05-17 ENCOUNTER — Ambulatory Visit
Admission: RE | Admit: 2013-05-17 | Discharge: 2013-05-17 | Disposition: A | Discharge status: Home / Self Care | Payer: Medicare Other | Source: Ambulatory Visit | Attending: Nephrology | Admitting: Nephrology

## 2013-05-17 VITALS — BP 192/73 | HR 84

## 2013-05-17 DIAGNOSIS — M549 Dorsalgia, unspecified: Secondary | ICD-10-CM

## 2013-05-17 DIAGNOSIS — M5126 Other intervertebral disc displacement, lumbar region: Secondary | ICD-10-CM

## 2013-05-17 MED ORDER — IOHEXOL 180 MG/ML  SOLN
1.0000 mL | Freq: Once | INTRAMUSCULAR | Status: AC | PRN
  Administered 2013-05-17: 1 mL via EPIDURAL

## 2013-05-17 MED ORDER — METHYLPREDNISOLONE ACETATE 40 MG/ML INJ SUSP (RADIOLOG
120.0000 mg | Freq: Once | INTRAMUSCULAR | Status: AC
  Administered 2013-05-17: 120 mg via EPIDURAL

## 2013-05-17 NOTE — Discharge Instructions (Signed)

## 2013-09-12 ENCOUNTER — Other Ambulatory Visit: Payer: Self-pay | Admitting: Orthopedic Surgery

## 2013-09-12 DIAGNOSIS — M549 Dorsalgia, unspecified: Secondary | ICD-10-CM

## 2013-09-20 ENCOUNTER — Ambulatory Visit
Admission: RE | Admit: 2013-09-20 | Discharge: 2013-09-20 | Disposition: A | Discharge status: Home / Self Care | Payer: Medicare HMO | Source: Ambulatory Visit | Attending: Orthopedic Surgery | Admitting: Orthopedic Surgery

## 2013-09-20 DIAGNOSIS — M549 Dorsalgia, unspecified: Secondary | ICD-10-CM

## 2013-09-20 MED ORDER — IOHEXOL 180 MG/ML  SOLN
1.0000 mL | Freq: Once | INTRAMUSCULAR | Status: AC | PRN
  Administered 2013-09-20: 1 mL via EPIDURAL

## 2013-09-20 MED ORDER — METHYLPREDNISOLONE ACETATE 40 MG/ML INJ SUSP (RADIOLOG
120.0000 mg | Freq: Once | INTRAMUSCULAR | Status: AC
Start: 2013-09-20 — End: 2013-09-20
  Administered 2013-09-20: 120 mg via EPIDURAL

## 2013-09-20 NOTE — Discharge Instructions (Signed)

## 2013-11-14 ENCOUNTER — Ambulatory Visit
Admission: RE | Admit: 2013-11-14 | Discharge: 2013-11-14 | Disposition: A | Discharge status: Home / Self Care | Payer: Commercial Managed Care - HMO | Source: Ambulatory Visit | Attending: Orthopedic Surgery | Admitting: Orthopedic Surgery

## 2013-11-14 ENCOUNTER — Other Ambulatory Visit: Payer: Self-pay | Admitting: Orthopedic Surgery

## 2013-11-14 DIAGNOSIS — M25572 Pain in left ankle and joints of left foot: Secondary | ICD-10-CM

## 2013-12-31 ENCOUNTER — Other Ambulatory Visit: Payer: Self-pay | Admitting: Nurse Practitioner

## 2013-12-31 DIAGNOSIS — Z1231 Encounter for screening mammogram for malignant neoplasm of breast: Secondary | ICD-10-CM

## 2014-01-10 ENCOUNTER — Encounter (INDEPENDENT_AMBULATORY_CARE_PROVIDER_SITE_OTHER): Payer: Self-pay

## 2014-01-10 ENCOUNTER — Ambulatory Visit
Admission: RE | Admit: 2014-01-10 | Discharge: 2014-01-10 | Disposition: A | Discharge status: Home / Self Care | Payer: Medicare HMO | Source: Ambulatory Visit | Attending: Nurse Practitioner | Admitting: Nurse Practitioner

## 2014-01-10 DIAGNOSIS — Z1231 Encounter for screening mammogram for malignant neoplasm of breast: Secondary | ICD-10-CM

## 2014-02-15 ENCOUNTER — Other Ambulatory Visit: Payer: Self-pay | Admitting: Orthopedic Surgery

## 2014-02-15 DIAGNOSIS — M545 Low back pain: Secondary | ICD-10-CM

## 2014-03-11 ENCOUNTER — Ambulatory Visit
Admission: RE | Admit: 2014-03-11 | Discharge: 2014-03-11 | Disposition: A | Discharge status: Home / Self Care | Payer: Medicare HMO | Source: Ambulatory Visit | Attending: Orthopedic Surgery | Admitting: Orthopedic Surgery

## 2014-03-11 DIAGNOSIS — M545 Low back pain: Secondary | ICD-10-CM

## 2014-03-11 MED ORDER — IOHEXOL 180 MG/ML  SOLN
1.0000 mL | Freq: Once | INTRAMUSCULAR | Status: AC | PRN
  Administered 2014-03-11: 1 mL via EPIDURAL

## 2014-03-11 MED ORDER — METHYLPREDNISOLONE ACETATE 40 MG/ML INJ SUSP (RADIOLOG
120.0000 mg | Freq: Once | INTRAMUSCULAR | Status: AC
  Administered 2014-03-11: 120 mg via EPIDURAL

## 2014-03-11 NOTE — Discharge Instructions (Signed)

## 2014-03-14 ENCOUNTER — Encounter (HOSPITAL_COMMUNITY): Payer: Self-pay | Admitting: Pharmacy Technician

## 2014-03-14 ENCOUNTER — Other Ambulatory Visit: Payer: Self-pay | Admitting: Ophthalmology

## 2014-03-14 MED ORDER — TETRACAINE HCL 0.5 % OP SOLN: 1.0000 [drp] | OPHTHALMIC | Status: DC

## 2014-03-14 NOTE — Pre-Procedure Instructions (Signed)
[name]  03/14/2014   Your procedure is scheduled on:  July 22nd, Wednesday.   Report to [facility] Admitting at  7:15 AM.  Call this number if you have problems the morning of surgery: [phone]   Remember:   Do not eat food or drink liquids after midnight Tuesday.   Take these medicines the morning of surgery with A SIP OF WATER: Norvasc, Alprazolam (if needed)   Do not wear jewelry, make-up or nail polish.  Do not wear lotions, powders, or perfumes. You may NOT wear deodorant.  Do not shave 48 hours prior to surgery.    Do not bring valuables to the hospital.  [facility] is not responsible for any belongings or valuables.               Contacts, dentures or bridgework may not be worn into surgery.  Leave suitcase in the car. After surgery it may be brought to your room.  For patients admitted to the hospital, discharge time is determined by your treatment team.               Patients discharged the day of surgery will not be allowed to drive home.   Name and phone number of your driver:    Special Instructions: "Preparing for Surgery" instruction sheet.   Please read over the following fact sheets that you were given: Pain Booklet and Surgical Site Infection Prevention

## 2014-03-14 NOTE — H&P (Signed)
History & Physical:   DATE:   03-08-14  NAME:  [name], [name]             HISTORY OF PRESENT ILLNESS: Dr. Ricki [name] former patient     Chief Eye Complaints  Cataracts: Va is blurry, glasses are no longer working. No pain or floaters.   patient would like to have Cat Sx July 22, not right now due to upcoming plans and going on a trip.  HPI: EYES: Reports symptoms of   difficulty seeing computer patient states glasses don't help  LOCATION:   BOTH EYES       QUALITY/COURSE:   Reports condition is worsening.        INTENSITY/SEVERITY:    Reports measurement ( or degree) as moderate.      DURATION:   Reports the general length of symptoms to be months.             ACTIVE PROBLEMS: Nuclear cataract NOS   ICD10:   ICD9: 366.04  Onset: 03/08/2014 12:58   Worst OD Glaucoma suspect NOS   ICD10:   ICD9: 365.00  Onset: 03/08/2014 12:58  SURGERIES: Knee surgery: about 4 years ago  Pick List - Surgeries  MEDICATIONS: Multivitamin: Strength-  SIG-  Dose-  Freq-     Amlodipine (Norvasc):   10 mg tablet  SIG-  1 each   once a day    Prednisone (Deltasone/Orasone): 10 mg tablet  SIG-  dose pak tab(s)   Freq-     Spironolactone (Aldactone):    25 mg tablet    SIG-   1 tab(s)      once a day in the AM               Aspirin:  81 mg tablet  SIG-  1 each   once a day    Xanax: Strength-  SIG-    Cilostazol: Strength-  SIG-  Dose-  Freq-     Furosemide (Lasix):   20 mg tablet  SIG-  1 each    in the AM     Zantac (Ranitidine) 150mg  Tab:   150 mg capsule  SIG-  1 each   2 times a day    Methocarbamol: Strength-  SIG-  Dose-  Freq-     Etodolac: Strength-  SIG-  Dose-  Freq-     Ciloxan (Ciprofloxacin) Solution: 0.3% solution SIG-  2 drop(s)  every 4 hours    Ciloxan (Ciprofloxacin) Solution: 0.3% solution SIG-  2 drop(s)  every 4 hours  REVIEW OF SYSTEMS: ROS:   GEN- Constitutional: HENT: GEN - Endocrine: Reports symptoms of LUNGS/Respiratory:   HEART/Cardiovascular: Reports symptoms of hypertension.    ABD/Gastrointestinal:  Musculoskeletal (BJE): +++++      back pain (lower or lumbar-sacral)   NEURO/Neurological: +++++      anxiety PSYCH/Psychiatric:    Is the pt oriented to time, place, person? yes  Mood  normal    TOBACCO:Smoker Status:   Never smoker   ICD10: Z87.898 ICD9: V13.89 Onset: 03/08/2014 10:42  SOCIAL HISTORY: retired   FAMILY HISTORY:  Family History - 1st Degree Relatives:  Daughter alive and well.    ALLERGIES: Neosporin Ophthalmic Ointment:             Comment-     Starter - Allergies - Summary:  PHYSICAL EXAMINATION: VS: BMI: 51.9.  BP: 174/58.  H:  56.50 in.  P: 62 /min.  W: 235lbs 0oz.    Va    OD: cc 20/40 OS: cc 20/25  EYEGLASSES:  OD: +0.75 +3.75 x161   OS: +2.25 +2.00 x019 ADD: +2.50  MR  03/08/2014 11:03  OD: +0.75 +3.00 x161 20/20     W/ room lights OD: +0.75 +3.00 x161 20/40 -2  OS: +1.75 +1.25 x019 20/20-1 ADD: +2.50  K's  OD: 43.75 44.00  OS: 43.50 44.25  VF:   OD: full in all four quadrants       OS: full in all four quadrants  Motility full  PUPILS: 38mm -NG   EYELIDS & OCULAR ADNEXA  normal    SLE: Conjunctiva Quiet  Cornea Arcus   anterior chamber   deep and quiet    Iris Brown   Lens 2-3  nuclear  sclerosis  OD,     2+  nuclear  sclerosis  OS   Vitreous  CCT  Ta   in mmHg    OD   17        OS 17 Time 03/08/2014 11:24   Gonio   Dilation TROP 1%  phenylephrine 2.5%    Fundus:  optic nerve  OD      60-70% cup OU                                                               OS   Macula      OD                      Clear OU                              OS   Vessels Narrow arterial  Periphery  normal       Exam: GENERAL: Appearance: HEAD, EARS, NOSE AND THROAT: Ears-Nose (external) Inspection: Externally, nose and ears are normal in appearance and without scars, lesions, or nodules.      Hearing assessment shows no problems with normal  conversation.      LUNGS and RESPIRATORY: Lung auscultation elicits no wheezing, rhonci, rales or rubs and with equal breath sounds.    Respiratory effort described as breathing is unlabored and chest movement is symmetrical.    HEART (Cardiovascular): Heart auscultation discovers regular rate and rhythm; no murmur, gallop or rub. Normal heart sounds.    ABDOMEN (Gastrointestinal): Mass/Tenderness Exam: Neither are present.     MUSCULOSKELETAL (BJE): Inspection-Palpation: No major bone, joint, tendon, or muscle changes.      NEUROLOGICAL: Alert and oriented. No major deficits of coordination or sensation.      PSYCHIATRIC: Insight and judgment appear  both to be intact and appropriate.    Mood and affect are described as normal mood and full affect.    SKIN: Skin Inspection: No rashes or lesions  ADMITTING DIAGNOSIS: Nuclear cataract NOS   ICD10:   ICD9: 366.04  Onset: 03/08/2014 12:58  Initial Date:    Worse OD Glaucoma suspect NOS   ICD10:   ICD9: 365.00  Onset: 03/08/2014 12:58  Initial Date:  SURGICAL TREATMENT PLAN: phaco emulsion cataract extraction  w  intraocular lens implant  OD     ___________________________  [name], Lauris Chroman - Inactive Problems:

## 2014-03-15 ENCOUNTER — Ambulatory Visit (HOSPITAL_COMMUNITY)
Admission: RE | Admit: 2014-03-15 | Discharge: 2014-03-15 | Disposition: A | Discharge status: Home / Self Care | Payer: Medicare HMO | Source: Ambulatory Visit | Attending: Anesthesiology | Admitting: Anesthesiology

## 2014-03-15 ENCOUNTER — Encounter (HOSPITAL_COMMUNITY)
Admission: RE | Admit: 2014-03-15 | Discharge: 2014-03-15 | Disposition: A | Discharge status: Home / Self Care | Payer: Medicare HMO | Source: Ambulatory Visit | Attending: Ophthalmology | Admitting: Ophthalmology

## 2014-03-15 ENCOUNTER — Encounter (HOSPITAL_COMMUNITY): Payer: Self-pay

## 2014-03-15 DIAGNOSIS — I10 Essential (primary) hypertension: Secondary | ICD-10-CM | POA: Insufficient documentation

## 2014-03-15 DIAGNOSIS — R011 Cardiac murmur, unspecified: Secondary | ICD-10-CM | POA: Insufficient documentation

## 2014-03-15 HISTORY — DX: Unspecified osteoarthritis, unspecified site: M19.90

## 2014-03-15 HISTORY — DX: Cardiac murmur, unspecified: R01.1

## 2014-03-15 LAB — COMPREHENSIVE METABOLIC PANEL
ALK PHOS: 74 U/L (ref 39–117)
ALT: 13 U/L (ref 0–35)
ANION GAP: 19 — AB (ref 5–15)
AST: 26 U/L (ref 0–37)
Albumin: 3.7 g/dL (ref 3.5–5.2)
BUN: 51 mg/dL — ABNORMAL HIGH (ref 6–23)
CALCIUM: 9.3 mg/dL (ref 8.4–10.5)
CO2: 21 meq/L (ref 19–32)
Chloride: 98 mEq/L (ref 96–112)
Creatinine, Ser: 1.72 mg/dL — ABNORMAL HIGH (ref 0.50–1.10)
GFR, EST AFRICAN AMERICAN: 31 mL/min — AB (ref >90)
GFR, EST NON AFRICAN AMERICAN: 26 mL/min — AB (ref >90)
GLUCOSE: 109 mg/dL — AB (ref 70–99)
Potassium: 5.7 mEq/L — ABNORMAL HIGH (ref 3.7–5.3)
SODIUM: 138 meq/L (ref 137–147)
Total Bilirubin: 0.3 mg/dL (ref 0.3–1.2)
Total Protein: 7.8 g/dL (ref 6.0–8.3)

## 2014-03-15 LAB — CBC
HCT: 40.5 % (ref 36.0–46.0)
Hemoglobin: 12.7 g/dL (ref 12.0–15.0)
MCH: 27.1 pg (ref 26.0–34.0)
MCHC: 31.4 g/dL (ref 30.0–36.0)
MCV: 86.4 fL (ref 78.0–100.0)
Platelets: 204 10*3/uL (ref 150–400)
RBC: 4.69 MIL/uL (ref 3.87–5.11)
RDW: 14.5 % (ref 11.5–15.5)
WBC: 8.4 10*3/uL (ref 4.0–10.5)

## 2014-03-15 NOTE — Progress Notes (Signed)
I called Dr. Fanny Bien office to give her a head's up.  Staff there states they last time they saw her was in 2014.

## 2014-03-18 NOTE — Progress Notes (Signed)
Anesthesia Chart Review:  Patient is a 80 year old (to turn 87 tomorrow) female scheduled for right cataract extraction on 03/20/14 by Dr. Venetia Maxon.   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 BMI is consistent with obesity.  She receives primary care thru [facility].  She primary sees Kathlene November, FNP. Nephrologist is Dr. Moshe Cipro.  To patient's understanding, she was discharged from Dr. Shelva Majestic office since her Cr normalized following discontinuation of Benicar in 04/2013.    Meds includes amlodipine, ASA 81 mg, Lasix 40 mg daily, Aldactone 100mg  daily, Prednisone 5 mg daily (for chronic right knee pain).  EKG on 03/15/14 showed NSR.  CXR on 03/15/14 showed: No active cardiopulmonary disease.  Renal ultrasound on 05/17/13 showed: Normal sized kidneys with no evidence of hydronephrosis. Left renal cyst is again evident. Cholelithiasis.  Preoperative labs noted.  K 5.7, specimen was marked as hemolyzed.  BUN/Cr 51/1.72.  (Her BUN/Cr were 60/2.48 on 12/31/13 at her PCP office.  According to labs from Dr. Shelva Majestic office, BUN/Cr were 17/1.26 on 05/11/13 with Cr down from 2.56.) I spoke with patient over the telephone prior to receiving her most recent comparison labs from [facility].  I told her that her Cr was higher than it was in 04/2013 and that her potassium was elevated but likely due to hemolysis.  She denied new edema, SOB, nausea, or urinary difficulties. I told her we would plan to recheck labs on the day of surgery and that I would forward the labs to Marisue Humble, [place] (done) to review who could determine further recommendations (ie medication adjustment and/or further nephrology follow-up) as felt appropriate.  If labs are stable then I would anticipate that she could proceed as planned with this procedure. Anesthesiologist Dr. Conrad Fayette  agrees with this plan.  [facility] Short Stay Center/Anesthesiology Phone [phone] 03/18/2014 2:20 PM

## 2014-03-19 MED ORDER — KETOROLAC TROMETHAMINE 0.5 % OP SOLN
1.0000 [drp] | OPHTHALMIC | Status: AC | PRN
  Administered 2014-03-20 (×3): 1 [drp] via OPHTHALMIC
  Filled 2014-03-19: qty 5

## 2014-03-19 MED ORDER — TROPICAMIDE 1 % OP SOLN
1.0000 [drp] | OPHTHALMIC | Status: AC | PRN
  Administered 2014-03-20 (×3): 1 [drp] via OPHTHALMIC
  Filled 2014-03-19: qty 3

## 2014-03-19 MED ORDER — GATIFLOXACIN 0.5 % OP SOLN
1.0000 [drp] | OPHTHALMIC | Status: AC | PRN
  Administered 2014-03-20 (×3): 1 [drp] via OPHTHALMIC
  Filled 2014-03-19: qty 2.5

## 2014-03-19 MED ORDER — PHENYLEPHRINE HCL 2.5 % OP SOLN
1.0000 [drp] | OPHTHALMIC | Status: AC | PRN
  Administered 2014-03-20 (×3): 1 [drp] via OPHTHALMIC
  Filled 2014-03-19: qty 2

## 2014-03-19 MED ORDER — CYCLOPENTOLATE HCL 1 % OP SOLN
1.0000 [drp] | OPHTHALMIC | Status: AC | PRN
  Administered 2014-03-20 (×3): 1 [drp] via OPHTHALMIC
  Filled 2014-03-19: qty 2

## 2014-03-19 NOTE — Progress Notes (Signed)
Patient was called and message  left on answering machine that surgery time was change to 0830 AM and that arrive time is 0630 AM. Call back phone number was left for [phone].

## 2014-03-20 ENCOUNTER — Encounter (HOSPITAL_COMMUNITY)
Admission: RE | Disposition: A | Discharge status: Home / Self Care | Payer: Self-pay | Source: Ambulatory Visit | Attending: Ophthalmology

## 2014-03-20 ENCOUNTER — Ambulatory Visit (HOSPITAL_COMMUNITY): Payer: Medicare HMO | Admitting: Anesthesiology

## 2014-03-20 ENCOUNTER — Ambulatory Visit (HOSPITAL_BASED_OUTPATIENT_CLINIC_OR_DEPARTMENT_OTHER)
Admission: RE | Admit: 2014-03-20 | Discharge: 2014-03-20 | Disposition: A | Discharge status: Home / Self Care | Payer: Medicare HMO | Source: Ambulatory Visit | Attending: Ophthalmology | Admitting: Ophthalmology

## 2014-03-20 ENCOUNTER — Encounter (HOSPITAL_COMMUNITY): Payer: Self-pay | Admitting: Anesthesiology

## 2014-03-20 ENCOUNTER — Encounter (HOSPITAL_COMMUNITY): Payer: Medicare HMO | Admitting: Vascular Surgery

## 2014-03-20 DIAGNOSIS — M129 Arthropathy, unspecified: Secondary | ICD-10-CM | POA: Diagnosis not present

## 2014-03-20 DIAGNOSIS — H2510 Age-related nuclear cataract, unspecified eye: Secondary | ICD-10-CM | POA: Insufficient documentation

## 2014-03-20 DIAGNOSIS — Z79899 Other long term (current) drug therapy: Secondary | ICD-10-CM | POA: Insufficient documentation

## 2014-03-20 DIAGNOSIS — I10 Essential (primary) hypertension: Secondary | ICD-10-CM | POA: Diagnosis not present

## 2014-03-20 DIAGNOSIS — Z7982 Long term (current) use of aspirin: Secondary | ICD-10-CM | POA: Diagnosis not present

## 2014-03-20 HISTORY — PX: CATARACT EXTRACTION W/PHACO: SHX586

## 2014-03-20 LAB — POCT I-STAT, CHEM 8
BUN: 54 mg/dL — ABNORMAL HIGH (ref 6–23)
CHLORIDE: 107 meq/L (ref 96–112)
Calcium, Ion: 1.26 mmol/L (ref 1.13–1.30)
Creatinine, Ser: 2.2 mg/dL — ABNORMAL HIGH (ref 0.50–1.10)
Glucose, Bld: 100 mg/dL — ABNORMAL HIGH (ref 70–99)
HCT: 40 % (ref 36.0–46.0)
HEMOGLOBIN: 13.6 g/dL (ref 12.0–15.0)
Potassium: 5.8 mEq/L — ABNORMAL HIGH (ref 3.7–5.3)
SODIUM: 138 meq/L (ref 137–147)
TCO2: 23 mmol/L (ref 0–100)

## 2014-03-20 SURGERY — PHACOEMULSIFICATION, CATARACT, WITH IOL INSERTION
Anesthesia: Monitor Anesthesia Care | Site: Eye | Laterality: Right

## 2014-03-20 SURGERY — PHACOEMULSIFICATION, CATARACT, WITH IOL INSERTION
Anesthesia: Monitor Anesthesia Care | Laterality: Right

## 2014-03-20 MED ORDER — FENTANYL CITRATE 0.05 MG/ML IJ SOLN
INTRAMUSCULAR | Status: DC | PRN
  Administered 2014-03-20: 50 ug via INTRAVENOUS

## 2014-03-20 MED ORDER — BSS IO SOLN
INTRAOCULAR | Status: DC | PRN
  Administered 2014-03-20: 15 mL via INTRAOCULAR

## 2014-03-20 MED ORDER — SODIUM CHLORIDE 0.9 % IV SOLN
INTRAVENOUS | Status: DC | PRN
  Administered 2014-03-20: 08:00:00 via INTRAVENOUS

## 2014-03-20 MED ORDER — EPINEPHRINE HCL 1 MG/ML IJ SOLN
INTRAOCULAR | Status: DC | PRN
  Administered 2014-03-20: 08:00:00

## 2014-03-20 MED ORDER — PROPOFOL 10 MG/ML IV BOLUS
INTRAVENOUS | Status: AC
  Filled 2014-03-20: qty 20

## 2014-03-20 MED ORDER — LIDOCAINE HCL 2 % IJ SOLN
INTRAMUSCULAR | Status: AC
  Filled 2014-03-20: qty 20

## 2014-03-20 MED ORDER — HYALURONIDASE HUMAN 150 UNIT/ML IJ SOLN
INTRAMUSCULAR | Status: AC
  Filled 2014-03-20: qty 1

## 2014-03-20 MED ORDER — SODIUM HYALURONATE 10 MG/ML IO SOLN
INTRAOCULAR | Status: AC
  Filled 2014-03-20: qty 0.85

## 2014-03-20 MED ORDER — NA CHONDROIT SULF-NA HYALURON 40-30 MG/ML IO SOLN
INTRAOCULAR | Status: AC
  Filled 2014-03-20: qty 0.5

## 2014-03-20 MED ORDER — NA CHONDROIT SULF-NA HYALURON 40-30 MG/ML IO SOLN
INTRAOCULAR | Status: DC | PRN
  Administered 2014-03-20: 0.5 mL via INTRAOCULAR

## 2014-03-20 MED ORDER — SODIUM HYALURONATE 10 MG/ML IO SOLN
INTRAOCULAR | Status: DC | PRN
  Administered 2014-03-20: 0.85 mL via INTRAOCULAR

## 2014-03-20 MED ORDER — TETRACAINE HCL 0.5 % OP SOLN
OPHTHALMIC | Status: AC
  Filled 2014-03-20: qty 2

## 2014-03-20 MED ORDER — BSS IO SOLN
INTRAOCULAR | Status: AC
  Filled 2014-03-20: qty 15

## 2014-03-20 MED ORDER — FENTANYL CITRATE 0.05 MG/ML IJ SOLN
INTRAMUSCULAR | Status: AC
  Filled 2014-03-20: qty 5

## 2014-03-20 MED ORDER — LIDOCAINE-EPINEPHRINE 2 %-1:100000 IJ SOLN
INTRAMUSCULAR | Status: DC | PRN
  Administered 2014-03-20: 09:00:00 via RETROBULBAR

## 2014-03-20 MED ORDER — PILOCARPINE HCL 4 % OP SOLN
OPHTHALMIC | Status: AC
  Filled 2014-03-20: qty 15

## 2014-03-20 MED ORDER — BUPIVACAINE HCL (PF) 0.75 % IJ SOLN
INTRAMUSCULAR | Status: AC
  Filled 2014-03-20: qty 10

## 2014-03-20 MED ORDER — TETRACAINE HCL 0.5 % OP SOLN: OPHTHALMIC | Status: DC | PRN

## 2014-03-20 MED ORDER — FENTANYL CITRATE 0.05 MG/ML IJ SOLN: INTRAMUSCULAR | Status: DC | PRN

## 2014-03-20 MED ORDER — AMLODIPINE BESYLATE 5 MG PO TABS
5.0000 mg | ORAL_TABLET | Freq: Every day | ORAL | Status: DC
  Administered 2014-03-20: 5 mg via ORAL
  Filled 2014-03-20: qty 1

## 2014-03-20 MED ORDER — PROPOFOL 10 MG/ML IV BOLUS
INTRAVENOUS | Status: DC | PRN
  Administered 2014-03-20 (×3): 20 mg via INTRAVENOUS

## 2014-03-20 MED ORDER — BSS IO SOLN
INTRAOCULAR | Status: AC
  Filled 2014-03-20: qty 500

## 2014-03-20 MED ORDER — TOBRAMYCIN 0.3 % OP OINT
TOPICAL_OINTMENT | OPHTHALMIC | Status: DC | PRN
  Administered 2014-03-20: 1 via OPHTHALMIC

## 2014-03-20 MED ORDER — ONDANSETRON HCL 4 MG/2ML IJ SOLN
INTRAMUSCULAR | Status: AC
  Filled 2014-03-20: qty 2

## 2014-03-20 MED ORDER — ACETYLCHOLINE CHLORIDE 1:100 IO SOLR
INTRAOCULAR | Status: AC
  Filled 2014-03-20: qty 1

## 2014-03-20 MED ORDER — LIDOCAINE HCL (CARDIAC) 20 MG/ML IV SOLN
INTRAVENOUS | Status: DC | PRN
  Administered 2014-03-20: 30 mg via INTRAVENOUS

## 2014-03-20 MED ORDER — ACETYLCHOLINE CHLORIDE 1:100 IO SOLR
INTRAOCULAR | Status: DC | PRN
  Administered 2014-03-20: 10 mg via INTRAOCULAR

## 2014-03-20 MED ORDER — EPINEPHRINE HCL 1 MG/ML IJ SOLN
INTRAMUSCULAR | Status: AC
  Filled 2014-03-20: qty 1

## 2014-03-20 MED ORDER — LIDOCAINE-EPINEPHRINE 2 %-1:100000 IJ SOLN
INTRAMUSCULAR | Status: AC
  Filled 2014-03-20: qty 1

## 2014-03-20 MED ORDER — HYDROCODONE-ACETAMINOPHEN 5-325 MG PO TABS
ORAL_TABLET | ORAL | Status: AC
  Administered 2014-03-20: 2
  Filled 2014-03-20: qty 2

## 2014-03-20 MED ORDER — ONDANSETRON HCL 4 MG/2ML IJ SOLN
INTRAMUSCULAR | Status: DC | PRN
  Administered 2014-03-20: 4 mg via INTRAVENOUS

## 2014-03-20 MED ORDER — TOBRAMYCIN-DEXAMETHASONE 0.3-0.1 % OP OINT
TOPICAL_OINTMENT | OPHTHALMIC | Status: AC
  Filled 2014-03-20: qty 3.5

## 2014-03-20 MED ORDER — GENTAMICIN SULFATE 40 MG/ML IJ SOLN
INTRAMUSCULAR | Status: AC
  Filled 2014-03-20: qty 2

## 2014-03-20 SURGICAL SUPPLY — 41 items
APL SRG 3 HI ABS STRL LF PLS (MISCELLANEOUS) ×1
APPLICATOR COTTON TIP 6IN STRL (MISCELLANEOUS) ×3 IMPLANT
APPLICATOR DR MATTHEWS STRL (MISCELLANEOUS) ×3 IMPLANT
BLADE KERATOME 2.75 (BLADE) ×2 IMPLANT
BLADE KERATOME 2.75MM (BLADE) ×1
CANNULA ANTERIOR CHAMBER 27GA (MISCELLANEOUS) ×3 IMPLANT
CORDS BIPOLAR (ELECTRODE) IMPLANT
COVER MAYO STAND STRL (DRAPES) ×3 IMPLANT
DRAPE OPHTHALMIC 40X48 W POUCH (DRAPES) ×3 IMPLANT
DRAPE RETRACTOR (MISCELLANEOUS) ×3 IMPLANT
GLOVE BIO SURGEON STRL SZ7.5 (GLOVE) ×2 IMPLANT
GLOVE BIO SURGEON STRL SZ8 (GLOVE) ×3 IMPLANT
GLOVE SURG SS PI 7.0 STRL IVOR (GLOVE) ×2 IMPLANT
GOWN STRL REUS W/ TWL LRG LVL3 (GOWN DISPOSABLE) ×2 IMPLANT
GOWN STRL REUS W/TWL LRG LVL3 (GOWN DISPOSABLE) ×6
KIT BASIN OR (CUSTOM PROCEDURE TRAY) ×3 IMPLANT
KIT ROOM TURNOVER OR (KITS) ×3 IMPLANT
LENS IOL ACRSF IQ PC 22.0 (Intraocular Lens) IMPLANT
LENS IOL ACRYSOF IQ POST 22.0 (Intraocular Lens) ×3 IMPLANT
MASK EYE SHIELD (GAUZE/BANDAGES/DRESSINGS) ×2 IMPLANT
NDL 18GX1X1/2 (RX/OR ONLY) (NEEDLE) ×1 IMPLANT
NDL 25GX 5/8IN NON SAFETY (NEEDLE) ×1 IMPLANT
NDL FILTER BLUNT 18X1 1/2 (NEEDLE) ×1 IMPLANT
NEEDLE 18GX1X1/2 (RX/OR ONLY) (NEEDLE) ×3 IMPLANT
NEEDLE 25GX 5/8IN NON SAFETY (NEEDLE) ×3 IMPLANT
NEEDLE FILTER BLUNT 18X 1/2SAF (NEEDLE) ×2
NEEDLE FILTER BLUNT 18X1 1/2 (NEEDLE) ×1 IMPLANT
NS IRRIG 1000ML POUR BTL (IV SOLUTION) ×3 IMPLANT
PACK CATARACT CUSTOM (CUSTOM PROCEDURE TRAY) ×3 IMPLANT
PAD ARMBOARD 7.5X6 YLW CONV (MISCELLANEOUS) ×3 IMPLANT
PAK PIK CVS CATARACT (OPHTHALMIC) ×3 IMPLANT
SUT ETHILON 10 0 CS140 6 (SUTURE) ×2 IMPLANT
SUT SILK 6 0 G 6 (SUTURE) IMPLANT
SYR TB 1ML LUER SLIP (SYRINGE) ×3 IMPLANT
TAPE PAPER MEDFIX 1IN X 10YD (GAUZE/BANDAGES/DRESSINGS) ×2 IMPLANT
TAPE SURG TRANSPORE 1 IN (GAUZE/BANDAGES/DRESSINGS) IMPLANT
TAPE SURGICAL TRANSPORE 1 IN (GAUZE/BANDAGES/DRESSINGS) ×2
TIP ABS 45DEG FLARED 0.9MM (TIP) ×3 IMPLANT
TOWEL OR 17X26 10 PK STRL BLUE (TOWEL DISPOSABLE) ×3 IMPLANT
WATER STERILE IRR 1000ML POUR (IV SOLUTION) ×3 IMPLANT
WIPE INSTRUMENT VISIWIPE 73X73 (MISCELLANEOUS) ×3 IMPLANT

## 2014-03-20 NOTE — Interval H&P Note (Signed)
History and Physical Interval Note:  03/20/2014 8:28 AM  [name]  has presented today for surgery, with the diagnosis of CATARACT - RIGHT EYE  The various methods of treatment have been discussed with the patient and family. After consideration of risks, benefits and other options for treatment, the patient has consented to  Procedure(s): CATARACT EXTRACTION PHACO AND INTRAOCULAR LENS PLACEMENT ([place]) (Right) as a surgical intervention .  The patient's history has been reviewed, patient examined, no change in status, stable for surgery.  I have reviewed the patient's chart and labs.  Questions were answered to the patient's satisfaction.     [name]

## 2014-03-20 NOTE — Progress Notes (Signed)
BP elevated Pt. Takes norvasc 10mg  daily; only took 5mg  this a.m. Anesthesia notified.  Verbal order to give pt norvasc 5mg  in phase II, prior to discharge

## 2014-03-20 NOTE — Discharge Instructions (Signed)
The patient may remove the eye patch at 2:00 today and and spill the eye drops that were given to her at the doctor's office. The patient should where eyeglasses or the eye shield the plastic shield at all times the patient should sleep with plastic she'll on the eye did not sleep on the right side and ovoid rubbing the eye.

## 2014-03-20 NOTE — Op Note (Signed)
Preoperative diagnosis: Visually significant cataract right eye Postoperative diagnosis: Same Procedure: Phacoemulsification with intraocular lens implant Complications: None Anesthesia: 2% Xylocaine with epinephrine in a 50-50 mixture 0.75% Marcaine with ample Wydase Procedure: Patient was transferred to the operating room where she was given a peribulbar block with the aforementioned local anesthetic agent. Following this the patient's face was prepped and draped in the usual sterile fashion. With the surgeon sitting temporally the operating microscope in position a Weck-Cel sponges used to fixate the globe and a 15 blade was used to enter through superior clear cornea following this using an additional Weck-Cel sponge and a 2.75 mm blade was used in a stepwise fashion through temporal clear cornea to into the anterior chamber Viscoat was injected to inflate the anterior chamber. Following this a bent 25-gauge needle was used to incise anterior capsule and a continuous tear curvilinear capsulorrhexis was formed. BSS was then used to hydrodissect and hydrodelineate the nucleus. The nucleus was noted to rotate then the capsular bag the phacoemulsification unit was then used to remove the epinucleus a central trough was sculpted in the nucleus and the nucleus was separated using the snap hook and the phacotip divide the nucleus into 4 quadrants and all nuclear fragments were removed from the eye the posterior capsule remained intact the irrigation aspiration device was then used to remove the epinucleus and cortex cortex was stripped from the posterior capsule Provisc was injected in the anterior chamber. The intraocular lens implant was examined and noted to have nodefects the lens was an Alcon AcrySof IQ model SN 60 WF 22 diopter [id number]. The lens was injected in the anterior chamber and unfolded the Kuglen hook was used to position the lens. Subincisional cortex were removed as well as viscoelastic  from the eye the posterior capsule remained intact. Miochol was injected the eye was pressurized and a single 10-0 nylon suture was placed the incision was watertight closed therefore topical TobraDex ointment was applied to the eye all instruments were removed a patch and Fox U. were placed and the patient returned to recovery area in stable condition [name] Junior M.D.

## 2014-03-20 NOTE — Anesthesia Postprocedure Evaluation (Signed)
Anesthesia Post Note  Patient: [name]  Procedure(s) Performed: Procedure(s) (LRB): CATARACT EXTRACTION PHACO AND INTRAOCULAR LENS PLACEMENT ([facility]) (Right)  Anesthesia type: MAC  Patient location: PACU  Post pain: Pain level controlled and Adequate analgesia  Post assessment: Post-op Vital signs reviewed, Patient's Cardiovascular Status Stable and Respiratory Function Stable  Last Vitals:  Filed Vitals:   03/20/14 0952  BP: 205/40  Pulse: 65  Temp: 36.7 C  Resp: 20    Post vital signs: Reviewed and stable  Level of consciousness: awake, alert  and oriented  Complications: No apparent anesthesia complications

## 2014-03-20 NOTE — Anesthesia Preprocedure Evaluation (Signed)
Anesthesia Evaluation  Patient identified by MRN, date of birth, ID band Patient awake    Reviewed: Allergy & Precautions, H&P , NPO status , Patient's Chart, lab work & pertinent test results  Airway Mallampati: II  Neck ROM: full    Dental   Pulmonary neg pulmonary ROS,          Cardiovascular hypertension,     Neuro/Psych    GI/Hepatic   Endo/Other  obese  Renal/GU Renal InsufficiencyRenal disease     Musculoskeletal  (+) Arthritis -,   Abdominal   Peds  Hematology   Anesthesia Other Findings   Reproductive/Obstetrics                           Anesthesia Physical Anesthesia Plan  ASA: II  Anesthesia Plan: MAC   Post-op Pain Management:    Induction: Intravenous  Airway Management Planned: Nasal Cannula  Additional Equipment:   Intra-op Plan:   Post-operative Plan:   Informed Consent: I have reviewed the patients History and Physical, chart, labs and discussed the procedure including the risks, benefits and alternatives for the proposed anesthesia with the patient or authorized representative who has indicated his/her understanding and acceptance.     Plan Discussed with: CRNA, Anesthesiologist and Surgeon  Anesthesia Plan Comments:         Anesthesia Quick Evaluation

## 2014-03-20 NOTE — Transfer of Care (Signed)
Immediate Anesthesia Transfer of Care Note  Patient: [name]  Procedure(s) Performed: Procedure(s): CATARACT EXTRACTION PHACO AND INTRAOCULAR LENS PLACEMENT ([facility]) (Right)  Patient Location: PACU  Anesthesia Type:MAC  Level of Consciousness: awake, alert , oriented and sedated  Airway & Oxygen Therapy: Patient Spontanous Breathing  Post-op Assessment: Report given to PACU RN, Post -op Vital signs reviewed and stable and Patient moving all extremities  Post vital signs: Reviewed and stable  Complications: No apparent anesthesia complications

## 2014-03-20 NOTE — H&P (View-Only) (Signed)
History & Physical:   DATE:   03-08-14  NAME:  [name], [name]             HISTORY OF PRESENT ILLNESS: Dr. Ricki Miller former patient     Chief Eye Complaints  Cataracts: Va is blurry, glasses are no longer working. No pain or floaters.   patient would like to have Cat Sx July 22, not right now due to upcoming plans and going on a trip.  HPI: EYES: Reports symptoms of   difficulty seeing computer patient states glasses don't help  LOCATION:   BOTH EYES       QUALITY/COURSE:   Reports condition is worsening.        INTENSITY/SEVERITY:    Reports measurement ( or degree) as moderate.      DURATION:   Reports the general length of symptoms to be months.             ACTIVE PROBLEMS: Nuclear cataract NOS   ICD10:   ICD9: 366.04  Onset: 03/08/2014 12:58   Worst OD Glaucoma suspect NOS   ICD10:   ICD9: 365.00  Onset: 03/08/2014 12:58  SURGERIES: Knee surgery: about 4 years ago  Pick List - Surgeries  MEDICATIONS: Multivitamin: Strength-  SIG-  Dose-  Freq-     Amlodipine (Norvasc):   10 mg tablet  SIG-  1 each   once a day    Prednisone (Deltasone/Orasone): 10 mg tablet  SIG-  dose pak tab(s)   Freq-     Spironolactone (Aldactone):    25 mg tablet    SIG-   1 tab(s)      once a day in the AM               Aspirin:  81 mg tablet  SIG-  1 each   once a day    Xanax: Strength-  SIG-    Cilostazol: Strength-  SIG-  Dose-  Freq-     Furosemide (Lasix):   20 mg tablet  SIG-  1 each    in the AM     Zantac (Ranitidine) 150mg  Tab:   150 mg capsule  SIG-  1 each   2 times a day    Methocarbamol: Strength-  SIG-  Dose-  Freq-     Etodolac: Strength-  SIG-  Dose-  Freq-     Ciloxan (Ciprofloxacin) Solution: 0.3% solution SIG-  2 drop(s)  every 4 hours    Ciloxan (Ciprofloxacin) Solution: 0.3% solution SIG-  2 drop(s)  every 4 hours  REVIEW OF SYSTEMS: ROS:   GEN- Constitutional: HENT: GEN - Endocrine: Reports symptoms of LUNGS/Respiratory:   HEART/Cardiovascular: Reports symptoms of hypertension.    ABD/Gastrointestinal:  Musculoskeletal (BJE): +++++      back pain (lower or lumbar-sacral)   NEURO/Neurological: +++++      anxiety PSYCH/Psychiatric:    Is the pt oriented to time, place, person? yes  Mood  normal    TOBACCO:Smoker Status:   Never smoker   ICD10: Z87.898 ICD9: V13.89 Onset: 03/08/2014 10:42  SOCIAL HISTORY: retired   FAMILY HISTORY:  Family History - 1st Degree Relatives:  Daughter alive and well.    ALLERGIES: Neosporin Ophthalmic Ointment:             Comment-     Starter - Allergies - Summary:  PHYSICAL EXAMINATION: VS: BMI: 51.9.  BP: 174/58.  H:  56.50 in.  P: 62 /min.  W: 235lbs 0oz.    Va    OD: cc 20/40 OS: cc 20/25  EYEGLASSES:  OD: +0.75 +3.75 x161   OS: +2.25 +2.00 x019 ADD: +2.50  MR  03/08/2014 11:03  OD: +0.75 +3.00 x161 20/20     W/ room lights OD: +0.75 +3.00 x161 20/40 -2  OS: +1.75 +1.25 x019 20/20-1 ADD: +2.50  K's  OD: 43.75 44.00  OS: 43.50 44.25  VF:   OD: full in all four quadrants       OS: full in all four quadrants  Motility full  PUPILS: 82mm -NG   EYELIDS & OCULAR ADNEXA  normal    SLE: Conjunctiva Quiet  Cornea Arcus   anterior chamber   deep and quiet    Iris Brown   Lens 2-3  nuclear  sclerosis  OD,     2+  nuclear  sclerosis  OS   Vitreous  CCT  Ta   in mmHg    OD   17        OS 17 Time 03/08/2014 11:24   Gonio   Dilation TROP 1%  phenylephrine 2.5%    Fundus:  optic nerve  OD      60-70% cup OU                                                               OS   Macula      OD                      Clear OU                              OS   Vessels Narrow arterial  Periphery  normal       Exam: GENERAL: Appearance: HEAD, EARS, NOSE AND THROAT: Ears-Nose (external) Inspection: Externally, nose and ears are normal in appearance and without scars, lesions, or nodules.      Hearing assessment shows no problems with normal  conversation.      LUNGS and RESPIRATORY: Lung auscultation elicits no wheezing, rhonci, rales or rubs and with equal breath sounds.    Respiratory effort described as breathing is unlabored and chest movement is symmetrical.    HEART (Cardiovascular): Heart auscultation discovers regular rate and rhythm; no murmur, gallop or rub. Normal heart sounds.    ABDOMEN (Gastrointestinal): Mass/Tenderness Exam: Neither are present.     MUSCULOSKELETAL (BJE): Inspection-Palpation: No major bone, joint, tendon, or muscle changes.      NEUROLOGICAL: Alert and oriented. No major deficits of coordination or sensation.      PSYCHIATRIC: Insight and judgment appear  both to be intact and appropriate.    Mood and affect are described as normal mood and full affect.    SKIN: Skin Inspection: No rashes or lesions  ADMITTING DIAGNOSIS: Nuclear cataract NOS   ICD10:   ICD9: 366.04  Onset: 03/08/2014 12:58  Initial Date:    Worse OD Glaucoma suspect NOS   ICD10:   ICD9: 365.00  Onset: 03/08/2014 12:58  Initial Date:  SURGICAL TREATMENT PLAN: phaco emulsion cataract extraction  w  intraocular lens implant  OD     ___________________________  [name], Lauris Chroman - Inactive Problems:

## 2014-03-21 ENCOUNTER — Encounter (HOSPITAL_COMMUNITY): Payer: Self-pay | Admitting: Ophthalmology

## 2014-05-15 ENCOUNTER — Ambulatory Visit: Payer: Medicare HMO | Attending: Orthopedic Surgery | Admitting: Physical Therapy

## 2014-05-15 DIAGNOSIS — IMO0001 Reserved for inherently not codable concepts without codable children: Secondary | ICD-10-CM | POA: Diagnosis not present

## 2014-05-15 DIAGNOSIS — M6281 Muscle weakness (generalized): Secondary | ICD-10-CM | POA: Insufficient documentation

## 2014-05-15 DIAGNOSIS — R269 Unspecified abnormalities of gait and mobility: Secondary | ICD-10-CM | POA: Insufficient documentation

## 2014-05-16 ENCOUNTER — Ambulatory Visit: Payer: Medicare HMO | Admitting: Physical Therapy

## 2014-05-16 DIAGNOSIS — IMO0001 Reserved for inherently not codable concepts without codable children: Secondary | ICD-10-CM | POA: Diagnosis not present

## 2014-05-20 ENCOUNTER — Ambulatory Visit: Payer: Medicare HMO | Admitting: Physical Therapy

## 2014-05-20 DIAGNOSIS — IMO0001 Reserved for inherently not codable concepts without codable children: Secondary | ICD-10-CM | POA: Diagnosis not present

## 2014-05-21 ENCOUNTER — Ambulatory Visit: Payer: Medicare HMO | Admitting: Physical Therapy

## 2014-05-21 DIAGNOSIS — IMO0001 Reserved for inherently not codable concepts without codable children: Secondary | ICD-10-CM | POA: Diagnosis not present

## 2014-05-28 ENCOUNTER — Ambulatory Visit: Payer: Medicare HMO | Admitting: Physical Therapy

## 2014-05-30 ENCOUNTER — Ambulatory Visit: Payer: Medicare HMO | Attending: Orthopedic Surgery | Admitting: Physical Therapy

## 2014-05-30 DIAGNOSIS — R269 Unspecified abnormalities of gait and mobility: Secondary | ICD-10-CM | POA: Insufficient documentation

## 2014-05-30 DIAGNOSIS — M6281 Muscle weakness (generalized): Secondary | ICD-10-CM | POA: Insufficient documentation

## 2014-06-04 ENCOUNTER — Ambulatory Visit: Payer: Medicare HMO | Admitting: Physical Therapy

## 2014-06-04 DIAGNOSIS — M6281 Muscle weakness (generalized): Secondary | ICD-10-CM | POA: Diagnosis not present

## 2014-06-06 ENCOUNTER — Ambulatory Visit: Payer: Medicare HMO | Admitting: Physical Therapy

## 2014-06-06 DIAGNOSIS — M6281 Muscle weakness (generalized): Secondary | ICD-10-CM | POA: Diagnosis not present

## 2014-06-11 ENCOUNTER — Ambulatory Visit: Payer: Medicare HMO | Admitting: Physical Therapy

## 2014-06-11 DIAGNOSIS — M6281 Muscle weakness (generalized): Secondary | ICD-10-CM | POA: Diagnosis not present

## 2014-06-13 ENCOUNTER — Ambulatory Visit: Payer: Medicare HMO | Admitting: Physical Therapy

## 2014-06-13 DIAGNOSIS — M6281 Muscle weakness (generalized): Secondary | ICD-10-CM | POA: Diagnosis not present

## 2014-11-21 ENCOUNTER — Inpatient Hospital Stay (HOSPITAL_COMMUNITY): Payer: Medicare HMO

## 2014-11-21 ENCOUNTER — Emergency Department (HOSPITAL_COMMUNITY): Payer: Medicare HMO

## 2014-11-21 ENCOUNTER — Inpatient Hospital Stay (HOSPITAL_COMMUNITY)
Admission: EM | Admit: 2014-11-21 | Discharge: 2014-11-24 | DRG: 392 | Disposition: A | Discharge status: Home / Self Care | Payer: Medicare HMO | Attending: Internal Medicine | Admitting: Internal Medicine

## 2014-11-21 ENCOUNTER — Encounter (HOSPITAL_COMMUNITY): Payer: Self-pay | Admitting: Emergency Medicine

## 2014-11-21 DIAGNOSIS — E669 Obesity, unspecified: Secondary | ICD-10-CM | POA: Diagnosis present

## 2014-11-21 DIAGNOSIS — N19 Unspecified kidney failure: Secondary | ICD-10-CM

## 2014-11-21 DIAGNOSIS — Z961 Presence of intraocular lens: Secondary | ICD-10-CM | POA: Diagnosis present

## 2014-11-21 DIAGNOSIS — E780 Pure hypercholesterolemia: Secondary | ICD-10-CM | POA: Diagnosis present

## 2014-11-21 DIAGNOSIS — E872 Acidosis, unspecified: Secondary | ICD-10-CM | POA: Diagnosis present

## 2014-11-21 DIAGNOSIS — N189 Chronic kidney disease, unspecified: Secondary | ICD-10-CM

## 2014-11-21 DIAGNOSIS — Z7952 Long term (current) use of systemic steroids: Secondary | ICD-10-CM | POA: Diagnosis not present

## 2014-11-21 DIAGNOSIS — A084 Viral intestinal infection, unspecified: Secondary | ICD-10-CM | POA: Diagnosis present

## 2014-11-21 DIAGNOSIS — Z888 Allergy status to other drugs, medicaments and biological substances status: Secondary | ICD-10-CM

## 2014-11-21 DIAGNOSIS — N183 Chronic kidney disease, stage 3 (moderate): Secondary | ICD-10-CM | POA: Diagnosis present

## 2014-11-21 DIAGNOSIS — E869 Volume depletion, unspecified: Secondary | ICD-10-CM | POA: Diagnosis present

## 2014-11-21 DIAGNOSIS — M1990 Unspecified osteoarthritis, unspecified site: Secondary | ICD-10-CM | POA: Diagnosis present

## 2014-11-21 DIAGNOSIS — Z6838 Body mass index (BMI) 38.0-38.9, adult: Secondary | ICD-10-CM

## 2014-11-21 DIAGNOSIS — Z96653 Presence of artificial knee joint, bilateral: Secondary | ICD-10-CM | POA: Diagnosis present

## 2014-11-21 DIAGNOSIS — E875 Hyperkalemia: Secondary | ICD-10-CM | POA: Diagnosis present

## 2014-11-21 DIAGNOSIS — I509 Heart failure, unspecified: Secondary | ICD-10-CM | POA: Diagnosis not present

## 2014-11-21 DIAGNOSIS — R112 Nausea with vomiting, unspecified: Secondary | ICD-10-CM

## 2014-11-21 DIAGNOSIS — R197 Diarrhea, unspecified: Secondary | ICD-10-CM

## 2014-11-21 DIAGNOSIS — R509 Fever, unspecified: Secondary | ICD-10-CM

## 2014-11-21 DIAGNOSIS — K8020 Calculus of gallbladder without cholecystitis without obstruction: Secondary | ICD-10-CM | POA: Diagnosis present

## 2014-11-21 DIAGNOSIS — N179 Acute kidney failure, unspecified: Secondary | ICD-10-CM | POA: Diagnosis present

## 2014-11-21 DIAGNOSIS — I10 Essential (primary) hypertension: Secondary | ICD-10-CM | POA: Diagnosis not present

## 2014-11-21 DIAGNOSIS — I129 Hypertensive chronic kidney disease with stage 1 through stage 4 chronic kidney disease, or unspecified chronic kidney disease: Secondary | ICD-10-CM | POA: Diagnosis present

## 2014-11-21 DIAGNOSIS — Z9841 Cataract extraction status, right eye: Secondary | ICD-10-CM

## 2014-11-21 DIAGNOSIS — R109 Unspecified abdominal pain: Secondary | ICD-10-CM

## 2014-11-21 DIAGNOSIS — N201 Calculus of ureter: Secondary | ICD-10-CM | POA: Diagnosis present

## 2014-11-21 LAB — CBC WITH DIFFERENTIAL/PLATELET
BASOS PCT: 0 % (ref 0–1)
Basophils Absolute: 0 10*3/uL (ref 0.0–0.1)
EOS ABS: 0.1 10*3/uL (ref 0.0–0.7)
EOS PCT: 1 % (ref 0–5)
HCT: 40.2 % (ref 36.0–46.0)
Hemoglobin: 12.4 g/dL (ref 12.0–15.0)
LYMPHS ABS: 1.1 10*3/uL (ref 0.7–4.0)
Lymphocytes Relative: 15 % (ref 12–46)
MCH: 26.5 pg (ref 26.0–34.0)
MCHC: 30.8 g/dL (ref 30.0–36.0)
MCV: 85.9 fL (ref 78.0–100.0)
Monocytes Absolute: 0.2 10*3/uL (ref 0.1–1.0)
Monocytes Relative: 3 % (ref 3–12)
NEUTROS PCT: 81 % — AB (ref 43–77)
Neutro Abs: 6.1 10*3/uL (ref 1.7–7.7)
PLATELETS: 183 10*3/uL (ref 150–400)
RBC: 4.68 MIL/uL (ref 3.87–5.11)
RDW: 14.5 % (ref 11.5–15.5)
WBC: 7.5 10*3/uL (ref 4.0–10.5)

## 2014-11-21 LAB — COMPREHENSIVE METABOLIC PANEL
ALT: 16 U/L (ref 0–35)
ANION GAP: 12 (ref 5–15)
AST: 24 U/L (ref 0–37)
Albumin: 3.7 g/dL (ref 3.5–5.2)
Alkaline Phosphatase: 69 U/L (ref 39–117)
BILIRUBIN TOTAL: 0.7 mg/dL (ref 0.3–1.2)
BUN: 43 mg/dL — ABNORMAL HIGH (ref 6–23)
CHLORIDE: 110 mmol/L (ref 96–112)
CO2: 16 mmol/L — ABNORMAL LOW (ref 19–32)
Calcium: 10 mg/dL (ref 8.4–10.5)
Creatinine, Ser: 2.76 mg/dL — ABNORMAL HIGH (ref 0.50–1.10)
GFR calc Af Amer: 17 mL/min — ABNORMAL LOW (ref >90)
GFR calc non Af Amer: 15 mL/min — ABNORMAL LOW (ref >90)
Glucose, Bld: 124 mg/dL — ABNORMAL HIGH (ref 70–99)
POTASSIUM: 6.4 mmol/L — AB (ref 3.5–5.1)
SODIUM: 138 mmol/L (ref 135–145)
Total Protein: 7.4 g/dL (ref 6.0–8.3)

## 2014-11-21 LAB — URINE MICROSCOPIC-ADD ON

## 2014-11-21 LAB — I-STAT ARTERIAL BLOOD GAS, ED
Acid-base deficit: 11 mmol/L — ABNORMAL HIGH (ref 0.0–2.0)
Bicarbonate: 14.1 mEq/L — ABNORMAL LOW (ref 20.0–24.0)
O2 SAT: 95 %
PH ART: 7.3 — AB (ref 7.350–7.450)
Patient temperature: 99.2
TCO2: 15 mmol/L (ref 0–100)
pCO2 arterial: 28.8 mmHg — ABNORMAL LOW (ref 35.0–45.0)
pO2, Arterial: 80 mmHg (ref 80.0–100.0)

## 2014-11-21 LAB — URINALYSIS, ROUTINE W REFLEX MICROSCOPIC
GLUCOSE, UA: 100 mg/dL — AB
Ketones, ur: NEGATIVE mg/dL
Nitrite: NEGATIVE
PH: 5 (ref 5.0–8.0)
Protein, ur: NEGATIVE mg/dL
SPECIFIC GRAVITY, URINE: 1.015 (ref 1.005–1.030)
Urobilinogen, UA: 0.2 mg/dL (ref 0.0–1.0)

## 2014-11-21 LAB — POTASSIUM: Potassium: 6.7 mmol/L (ref 3.5–5.1)

## 2014-11-21 LAB — LIPASE, BLOOD: Lipase: 33 U/L (ref 11–59)

## 2014-11-21 LAB — I-STAT CG4 LACTIC ACID, ED: Lactic Acid, Venous: 0.78 mmol/L (ref 0.5–2.0)

## 2014-11-21 MED ORDER — SODIUM CHLORIDE 0.9 % IV BOLUS (SEPSIS)
1000.0000 mL | Freq: Once | INTRAVENOUS | Status: AC
  Administered 2014-11-21: 1000 mL via INTRAVENOUS

## 2014-11-21 MED ORDER — INSULIN ASPART 100 UNIT/ML ~~LOC~~ SOLN
10.0000 [IU] | Freq: Once | SUBCUTANEOUS | Status: AC
  Administered 2014-11-21: 10 [IU] via SUBCUTANEOUS
  Filled 2014-11-21: qty 1

## 2014-11-21 MED ORDER — HYDROMORPHONE HCL 1 MG/ML IJ SOLN
1.0000 mg | Freq: Once | INTRAMUSCULAR | Status: AC
  Administered 2014-11-21: 1 mg via INTRAVENOUS
  Filled 2014-11-21: qty 1

## 2014-11-21 MED ORDER — SODIUM CHLORIDE 0.9 % IV SOLN: 1.0000 g | Freq: Once | INTRAVENOUS | Status: DC

## 2014-11-21 MED ORDER — MORPHINE SULFATE 4 MG/ML IJ SOLN
4.0000 mg | Freq: Once | INTRAMUSCULAR | Status: DC
  Filled 2014-11-21: qty 1

## 2014-11-21 MED ORDER — SODIUM CHLORIDE 0.9 % IV SOLN
1.0000 g | Freq: Once | INTRAVENOUS | Status: AC
  Administered 2014-11-22: 1 g via INTRAVENOUS
  Filled 2014-11-21 (×3): qty 10

## 2014-11-21 MED ORDER — DEXTROSE 50 % IV SOLN: 1.0000 | Freq: Once | INTRAVENOUS | Status: DC

## 2014-11-21 MED ORDER — SODIUM CHLORIDE 0.9 % IV SOLN
INTRAVENOUS | Status: DC
  Administered 2014-11-22: 125 mL/h via INTRAVENOUS

## 2014-11-21 MED ORDER — INSULIN ASPART 100 UNIT/ML ~~LOC~~ SOLN: 10.0000 [IU] | Freq: Once | SUBCUTANEOUS | Status: DC

## 2014-11-21 MED ORDER — SODIUM CHLORIDE 0.45 % IV SOLN
INTRAVENOUS | Status: DC
  Administered 2014-11-22: 02:00:00 via INTRAVENOUS
  Filled 2014-11-21 (×2): qty 1000

## 2014-11-21 MED ORDER — SODIUM POLYSTYRENE SULFONATE 15 GM/60ML PO SUSP
30.0000 g | Freq: Once | ORAL | Status: AC
  Administered 2014-11-21: 30 g via ORAL
  Filled 2014-11-21: qty 120

## 2014-11-21 MED ORDER — DEXTROSE 50 % IV SOLN
1.0000 | Freq: Once | INTRAVENOUS | Status: AC
  Administered 2014-11-21: 50 mL via INTRAVENOUS
  Filled 2014-11-21: qty 50

## 2014-11-21 MED ORDER — SODIUM POLYSTYRENE SULFONATE 15 GM/60ML PO SUSP: 30.0000 g | Freq: Once | ORAL | Status: DC

## 2014-11-21 NOTE — ED Notes (Addendum)
2 RNs attempted IV access x2. IV team consulted.

## 2014-11-21 NOTE — H&P (Addendum)
[facility] History and Physical  [name] [id number] DOB: 1928/09/10 DOA: 11/21/2014  Referring physician: ER physician. PCP: Default, Provider, MD [facility].  Chief Complaint: Diarrhea.  HPI: [name] is a 81 y.o. female with history of hypertension and chronic kidney disease and arthritis presents to the ER because of diarrhea. Patient states she has been having diarrhea since morning multiple times. Denies any nausea vomiting. Has been having some crampy abdominal pain. Denies any recent travel or sick contacts. Denies any recent use of antibiotics. Denies any chest pain or shortness of breath. In the ER patient's labs revealed hyperkalemia with EKG showing tall T waves. Patient was given calcium gluconate D50 insulin and fluid bolus. CT abdomen and pelvis without contrast shows multiple gallstones and renal stone. At this time patient has been admitted for further management of her diarrhea and hyperkalemia.   Review of Systems: As presented in the history of presenting illness, rest negative.  Past Medical History  Diagnosis Date  . Hypertension   . Hypercholesteremia   . Arthritis   . Heart murmur     long time ago--no problems  . Chronic kidney disease     had some renal insufficiency.  Sees Dr. Moshe Cipro who took her off benicar in 2013.              Past Surgical History  Procedure Laterality Date  . Joint replacement      both knees  . Failed knee surgery      on the right x 2  . Cataract extraction w/phaco Right 03/20/2014    Procedure: CATARACT EXTRACTION PHACO AND INTRAOCULAR LENS PLACEMENT (IOC);  Surgeon: [name], MD;  [place];  Service: Ophthalmology;  Laterality: Right;   Social History:  reports that she has never smoked. She does not have any smokeless tobacco history on file. She reports that she drinks alcohol. She reports that she does not use illicit drugs. Where does patient live home. Can patient participate  in ADLs? Yes.  Allergies  Allergen Reactions  . Neosporin [Neomycin-Bacitracin Zn-Polymyx] Itching    Hot and burning, too    Family History:  Family History  Problem Relation Age of Onset  . Diabetes Mellitus II Daughter   . Hypertension Daughter       Prior to Admission medications   Medication Sig Start Date End Date Taking? Authorizing Provider  ALPRAZolam Duanne Moron) 0.5 MG tablet Take 0.5 mg by mouth 3 (three) times daily as needed. For anxiety   Yes Historical Provider, MD  amLODipine (NORVASC) 10 MG tablet Take 10 mg by mouth daily.   Yes Historical Provider, MD  aspirin EC 81 MG tablet Take 81 mg by mouth daily.   Yes Historical Provider, MD  Cholecalciferol 50000 UNITS TABS Take 1 capsule by mouth every Monday.   Yes Historical Provider, MD  cilostazol (PLETAL) 100 MG tablet Take 50 mg by mouth 2 (two) times daily.  09/03/14  Yes Historical Provider, MD  etodolac (LODINE) 400 MG tablet Take 400 mg by mouth 2 (two) times daily. 11/04/14  Yes Historical Provider, MD  famotidine (PEPCID) 20 MG tablet Take 20 mg by mouth 2 (two) times daily.   Yes Historical Provider, MD  furosemide (LASIX) 40 MG tablet Take 40 mg by mouth 2 (two) times daily.    Yes Historical Provider, MD  HYDROcodone-acetaminophen (NORCO) 10-325 MG per tablet Take 1 tablet by mouth every 6 (six) hours as needed for severe pain.  10/02/14  Yes Historical Provider, MD  loperamide (IMODIUM) 2 MG capsule Take 2 mg by mouth as needed for diarrhea or loose stools.   Yes Historical Provider, MD  methocarbamol (ROBAXIN) 500 MG tablet Take 500 mg by mouth 2 (two) times daily as needed for muscle spasms.   Yes Historical Provider, MD  Multiple Vitamin (MULTIVITAMIN WITH MINERALS) TABS Take 1 tablet by mouth daily.   Yes Historical Provider, MD  Omega-3 Fatty Acids (FISH OIL PO) Take 1 capsule by mouth daily.   Yes Historical Provider, MD  predniSONE (DELTASONE) 5 MG tablet Take 2.5 mg by mouth daily.    Yes Historical Provider, MD   spironolactone (ALDACTONE) 25 MG tablet Take 25 mg by mouth daily. 11/04/14  Yes Historical Provider, MD    Physical Exam: Filed Vitals:   11/21/14 1900 11/21/14 1915 11/21/14 2149 11/21/14 2200  BP: 192/42 193/46  157/60  Pulse: 95 97 96 107  Temp:      TempSrc:      Resp: 15 17 18 14   SpO2: 100% 100% 98% 99%     General:  Moderately built and nourished.  Eyes: Anicteric no pallor.  ENT: No discharge from the ears eyes nose and mouth.  Neck: No mass felt.  Cardiovascular: S1 and S2 heard.  Respiratory: No rhonchi or crepitations.  Abdomen: Soft nontender bowel sounds present.  Skin: No rash.  Musculoskeletal: No edema.  Psychiatric: Appears normal.  Neurologic: Alert awake oriented to time place and person. Moves all extremities.  Labs on Admission:  Basic Metabolic Panel:  Recent Labs Lab 11/21/14 1711 11/21/14 2012  NA 138  --   K 6.4* 6.7*  CL 110  --   CO2 16*  --   GLUCOSE 124*  --   BUN 43*  --   CREATININE 2.76*  --   CALCIUM 10.0  --    Liver Function Tests:  Recent Labs Lab 11/21/14 1711  AST 24  ALT 16  ALKPHOS 69  BILITOT 0.7  PROT 7.4  ALBUMIN 3.7    Recent Labs Lab 11/21/14 1711  LIPASE 33   No results for input(s): AMMONIA in the last 168 hours. CBC:  Recent Labs Lab 11/21/14 1711  WBC 7.5  NEUTROABS 6.1  HGB 12.4  HCT 40.2  MCV 85.9  PLT 183   Cardiac Enzymes: No results for input(s): CKTOTAL, CKMB, CKMBINDEX, TROPONINI in the last 168 hours.  BNP (last 3 results) No results for input(s): BNP in the last 8760 hours.  ProBNP (last 3 results) No results for input(s): PROBNP in the last 8760 hours.  CBG: No results for input(s): GLUCAP in the last 168 hours.  Radiological Exams on Admission: Ct Abdomen Pelvis Wo Contrast  11/21/2014   CLINICAL DATA:  Lower abdominal pain with diarrhea  EXAM: CT ABDOMEN AND PELVIS WITHOUT CONTRAST  TECHNIQUE: Multidetector CT imaging of the abdomen and pelvis was performed  following the standard protocol without IV contrast.  COMPARISON:  11/15/07  FINDINGS: Lung bases are free of acute infiltrate or sizable effusion. Some stable scarring is noted in the medial right lung base.  The gallbladder is decompressed with multiple gallstones within. The liver, spleen, right adrenal gland and kidneys are within normal limits. No renal calculi are seen. A left adrenal lesion is again noted and stable measuring approximately 1 cm. This likely represents an adenoma. The collecting systems are well visualized without obstructive change. There is however a 5 mm stone identified at the right ureterovesical junction which  was not seen on the prior exam. No significant obstructive changes noted.  The bladder is well distended. No pelvic mass lesion is seen. Degenerative changes of the lumbar spine are seen. The appendix is within normal limits. Multiple colonic diverticular noted without evidence of diverticulitis.  IMPRESSION: 5 mm distal right ureteral stone without significant obstructive change.  Multiple gallstones stable from the prior exam.   Electronically Signed   By: Inez Catalina M.D.   On: 11/21/2014 21:11    EKG: Independently reviewed. Normal sinus rhythm with tall T waves.  Assessment/Plan Active Problems:   Hyperkalemia   Diarrhea   Metabolic acidosis   Hypertension, uncontrolled   Renal failure (ARF), acute on chronic   1. Hyperkalemia - patient has hyperkalemia with EKG changes. Patient has received calcium gluconate D50 with IV insulin and Kayexalate 15 g by mouth and also fluid bolus. At this time I have ordered bicarbonate drip since patient has metabolic acidosis. Recheck metabolic panel immediately and closely follow. I have advised patient to discontinue spironolactone since patient has chronic kidney disease and not to take again. Check cortisol level since patient is taking prednisone long-term for arthritis. 2. Diarrhea - check stool for C. difficile and  culture. Continue with hydration. 3. Metabolic acidosis with acute on chronic kidney disease - I have placed patient on bicarbonate drip. Closely follow metabolic panel. Discontinue NSAIDs. 4. Hypertension uncontrolled - discontinue spironolactone secondary to hyperkalemia. I placed patient on when necessary IV hydralazine and continue patient's amlodipine. Closely follow blood pressure trends. 5. Arthritis - discontinue NSAIDs since patient has worsening renal function and hyperkalemia and check cortisol levels since patient is on long-term prednisone. 6. Patient is mildly febrile - check chest x-ray and influenza PCR. 7. Gallbladder stones - if patient has nausea vomiting I will order sonogram of the abdomen.  Addendum  - patient did have one episode of nausea vomiting. Patient is still febrile. I will order sonogram of the abdomen to check for any evidence of cholecystitis. I have placed patient on Cipro and Flagyl.    DVT Prophylaxis Lovenox.  Code Status: Full code.  Family Communication: Patient's daughter at the bedside.  Disposition Plan: Admit to inpatient to stepdown unit.    KAKRAKANDY,ARSHAD N. [facility] Pager [phone].  If 7PM-7AM, please contact night-coverage [email or web address] Password [facility] 11/21/2014, 11:32 PM

## 2014-11-21 NOTE — ED Notes (Addendum)
Pt reports diarrhea and stomach pain since 0430 this am, multiple episodes of diarrhea throughout the day. Denies n/v, but unable to eat or drink today.

## 2014-11-21 NOTE — ED Notes (Signed)
Pt reports waking up this morning with lower abdominal cramping and diarrhea throughout the day- denies N/V.

## 2014-11-21 NOTE — ED Provider Notes (Signed)
CSN: [id number]     Arrival date & time 11/21/14  1649 History   First MD Initiated Contact with Patient 11/21/14 1847     Chief Complaint  Patient presents with  . Abdominal Cramping  . Diarrhea     (Consider location/radiation/quality/duration/timing/severity/associated sxs/prior Treatment) HPI Comments: Patient here complaining of acute onset of diarrhea and lower abdominal pain that began at 4:30 this morning. Denies fever but has had chills and bodyaches. Diarrhea has been watery without vomiting. Abdominal pain is been dull and located in the lower region with some urinary symptoms of dysuria. Patient has had an appetite. Denies any recent antibiotic use. No recent hospitalizations. Symptoms persistent and nothing makes them better or worse pain is from his prior to arrival.  Patient is a 81 y.o. female presenting with cramps and diarrhea. The history is provided by the patient.  Abdominal Cramping  Diarrhea   Past Medical History  Diagnosis Date  . Hypertension   . Hypercholesteremia   . Arthritis   . Heart murmur     long time ago--no problems  . Chronic kidney disease     had some renal insufficiency.  Sees Dr. Moshe Cipro who took her off benicar in 2013.              Past Surgical History  Procedure Laterality Date  . Joint replacement      both knees  . Failed knee surgery      on the right x 2  . Breast surgery      biopsy 30 yrs ago  . Cataract extraction w/phaco Right 03/20/2014    Procedure: CATARACT EXTRACTION PHACO AND INTRAOCULAR LENS PLACEMENT (IOC);  Surgeon: [name], MD;  Location: [place];  Service: Ophthalmology;  Laterality: Right;   No family history on file. History  Substance Use Topics  . Smoking status: Never Smoker   . Smokeless tobacco: Not on file  . Alcohol Use: Yes     Comment: occasional   OB History    No data available     Review of Systems  Gastrointestinal: Positive for diarrhea.  All other systems reviewed and are  negative.     Allergies  Neosporin  Home Medications   Prior to Admission medications   Medication Sig Start Date End Date Taking? Authorizing Provider  ALPRAZolam Duanne Moron) 0.5 MG tablet Take 0.5 mg by mouth 3 (three) times daily as needed. For anxiety    Historical Provider, MD  amLODipine (NORVASC) 10 MG tablet Take 10 mg by mouth daily.    Historical Provider, MD  aspirin EC 81 MG tablet Take 81 mg by mouth daily.    Historical Provider, MD  famotidine (PEPCID) 20 MG tablet Take 20 mg by mouth 2 (two) times daily.    Historical Provider, MD  furosemide (LASIX) 40 MG tablet Take 40 mg by mouth daily.    Historical Provider, MD  HYDROcodone-acetaminophen (LORCET) 10-650 MG per tablet Take 1 tablet by mouth every 6 (six) hours as needed. pain    Historical Provider, MD  Multiple Vitamin (MULTIVITAMIN WITH MINERALS) TABS Take 1 tablet by mouth daily.    Historical Provider, MD  Multiple Vitamin (MULTIVITAMIN) capsule Take 1 capsule by mouth daily.    Historical Provider, MD  predniSONE (DELTASONE) 5 MG tablet Take 2.5 mg by mouth daily.     Historical Provider, MD  spironolactone (ALDACTONE) 100 MG tablet Take 100 mg by mouth daily.    Historical Provider, MD   BP 156/74 mmHg  Pulse 93  Temp(Src) 99.2 F (37.3 C) (Oral)  Resp 20  SpO2 100% Physical Exam  Constitutional: She is oriented to person, place, and time. She appears well-developed and well-nourished.  Non-toxic appearance. No distress.  HENT:  Head: Normocephalic and atraumatic.  Eyes: Conjunctivae, EOM and lids are normal. Pupils are equal, round, and reactive to light.  Neck: Normal range of motion. Neck supple. No tracheal deviation present. No thyroid mass present.  Cardiovascular: Normal rate, regular rhythm and normal heart sounds.  Exam reveals no gallop.   No murmur heard. Pulmonary/Chest: Effort normal and breath sounds normal. No stridor. No respiratory distress. She has no decreased breath sounds. She has no  wheezes. She has no rhonchi. She has no rales.  Abdominal: Soft. Normal appearance and bowel sounds are normal. She exhibits no distension. There is tenderness in the right lower quadrant and left lower quadrant. There is no rigidity, no rebound, no guarding and no CVA tenderness.    Musculoskeletal: Normal range of motion. She exhibits no edema or tenderness.  Neurological: She is alert and oriented to person, place, and time. She has normal strength. No cranial nerve deficit or sensory deficit. GCS eye subscore is 4. GCS verbal subscore is 5. GCS motor subscore is 6.  Skin: Skin is warm and dry. No abrasion and no rash noted.  Psychiatric: She has a normal mood and affect. Her speech is normal and behavior is normal.  Nursing note and vitals reviewed.   ED Course  Procedures (including critical care time) Labs Review Labs Reviewed  CBC WITH DIFFERENTIAL/PLATELET - Abnormal; Notable for the following:    Neutrophils Relative % 81 (*)    All other components within normal limits  COMPREHENSIVE METABOLIC PANEL - Abnormal; Notable for the following:    Potassium 6.4 (*)    CO2 16 (*)    Glucose, Bld 124 (*)    BUN 43 (*)    Creatinine, Ser 2.76 (*)    GFR calc non Af Amer 15 (*)    GFR calc Af Amer 17 (*)    All other components within normal limits  STOOL CULTURE  LIPASE, BLOOD  POTASSIUM  I-STAT CG4 LACTIC ACID, ED    Imaging Review No results found.   EKG Interpretation   Date/Time:  Thursday November 21 2014 19:09:19 EDT Ventricular Rate:  90 PR Interval:  104 QRS Duration: 76 QT Interval:  347 QTC Calculation: 424 R Axis:   86 Text Interpretation:  Ectopic atrial rhythm Short PR interval Borderline  right axis deviation Consider left ventricular hypertrophy Borderline ST  elevation, lateral leads Baseline wander in lead(s) V2 No significant  change since last tracing Confirmed by Vitali Seibert  MD, Brielyn Bosak ([id number]) on  11/21/2014 7:15:31 PM      MDM   Final diagnoses:    Abdominal pain    Patient given IV fluids here and medications for treatment of her hyperkalemia. Abdominal CT shows nonobstructing stone. Patient also given pain medications for her discomfort. Discuss with possible he'll limit the patient.  CRITICAL CARE Performed by: Leota Jacobsen Total critical care time: 50 Critical care time was exclusive of separately billable procedures and treating other patients. Critical care was necessary to treat or prevent imminent or life-threatening deterioration. Critical care was time spent personally by me on the following activities: development of treatment plan with patient and/or surrogate as well as nursing, discussions with consultants, evaluation of patient's response to treatment, examination of patient, obtaining history from patient or surrogate,  ordering and performing treatments and interventions, ordering and review of laboratory studies, ordering and review of radiographic studies, pulse oximetry and re-evaluation of patient's condition.     Lacretia Leigh, MD 11/21/14 2207

## 2014-11-21 NOTE — ED Notes (Signed)
Contacted pharmacy for delay in medications

## 2014-11-22 ENCOUNTER — Encounter (HOSPITAL_COMMUNITY): Payer: Self-pay

## 2014-11-22 ENCOUNTER — Inpatient Hospital Stay (HOSPITAL_COMMUNITY): Payer: Medicare HMO

## 2014-11-22 DIAGNOSIS — I10 Essential (primary) hypertension: Secondary | ICD-10-CM

## 2014-11-22 LAB — BASIC METABOLIC PANEL
ANION GAP: 8 (ref 5–15)
Anion gap: 9 (ref 5–15)
Anion gap: 9 (ref 5–15)
Anion gap: 9 (ref 5–15)
BUN: 33 mg/dL — AB (ref 6–23)
BUN: 37 mg/dL — ABNORMAL HIGH (ref 6–23)
BUN: 39 mg/dL — AB (ref 6–23)
BUN: 39 mg/dL — ABNORMAL HIGH (ref 6–23)
CALCIUM: 10.1 mg/dL (ref 8.4–10.5)
CHLORIDE: 120 mmol/L — AB (ref 96–112)
CO2: 15 mmol/L — AB (ref 19–32)
CO2: 16 mmol/L — ABNORMAL LOW (ref 19–32)
CO2: 16 mmol/L — ABNORMAL LOW (ref 19–32)
CO2: 23 mmol/L (ref 19–32)
CREATININE: 2.3 mg/dL — AB (ref 0.50–1.10)
CREATININE: 2.39 mg/dL — AB (ref 0.50–1.10)
CREATININE: 2.51 mg/dL — AB (ref 0.50–1.10)
Calcium: 9.1 mg/dL (ref 8.4–10.5)
Calcium: 9.5 mg/dL (ref 8.4–10.5)
Calcium: 9.9 mg/dL (ref 8.4–10.5)
Chloride: 116 mmol/L — ABNORMAL HIGH (ref 96–112)
Chloride: 116 mmol/L — ABNORMAL HIGH (ref 96–112)
Chloride: 114 mmol/L — ABNORMAL HIGH (ref 96–112)
Creatinine, Ser: 2.5 mg/dL — ABNORMAL HIGH (ref 0.50–1.10)
GFR calc Af Amer: 19 mL/min — ABNORMAL LOW (ref >90)
GFR calc non Af Amer: 18 mL/min — ABNORMAL LOW (ref >90)
GFR, EST AFRICAN AMERICAN: 19 mL/min — AB (ref >90)
GFR, EST AFRICAN AMERICAN: 20 mL/min — AB (ref >90)
GFR, EST AFRICAN AMERICAN: 21 mL/min — AB (ref >90)
GFR, EST NON AFRICAN AMERICAN: 17 mL/min — AB (ref >90)
GFR, EST NON AFRICAN AMERICAN: 17 mL/min — AB (ref >90)
GFR, EST NON AFRICAN AMERICAN: 19 mL/min — AB (ref >90)
GLUCOSE: 109 mg/dL — AB (ref 70–99)
GLUCOSE: 104 mg/dL — AB (ref 70–99)
Glucose, Bld: 66 mg/dL — ABNORMAL LOW (ref 70–99)
Glucose, Bld: 78 mg/dL (ref 70–99)
POTASSIUM: 6 mmol/L — AB (ref 3.5–5.1)
POTASSIUM: 6.5 mmol/L — AB (ref 3.5–5.1)
Potassium: 5.3 mmol/L — ABNORMAL HIGH (ref 3.5–5.1)
Potassium: 5 mmol/L (ref 3.5–5.1)
Sodium: 140 mmol/L (ref 135–145)
Sodium: 140 mmol/L (ref 135–145)
Sodium: 145 mmol/L (ref 135–145)
Sodium: 146 mmol/L — ABNORMAL HIGH (ref 135–145)

## 2014-11-22 LAB — CBC WITH DIFFERENTIAL/PLATELET
BASOS PCT: 0 % (ref 0–1)
Basophils Absolute: 0 10*3/uL (ref 0.0–0.1)
EOS PCT: 0 % (ref 0–5)
Eosinophils Absolute: 0 10*3/uL (ref 0.0–0.7)
HEMATOCRIT: 34.4 % — AB (ref 36.0–46.0)
HEMOGLOBIN: 10.5 g/dL — AB (ref 12.0–15.0)
LYMPHS PCT: 11 % — AB (ref 12–46)
Lymphs Abs: 0.6 10*3/uL — ABNORMAL LOW (ref 0.7–4.0)
MCH: 25.8 pg — ABNORMAL LOW (ref 26.0–34.0)
MCHC: 30 g/dL (ref 30.0–36.0)
MCV: 86 fL (ref 78.0–100.0)
MONO ABS: 0.2 10*3/uL (ref 0.1–1.0)
MONOS PCT: 4 % (ref 3–12)
Neutro Abs: 4.4 10*3/uL (ref 1.7–7.7)
Neutrophils Relative %: 85 % — ABNORMAL HIGH (ref 43–77)
Platelets: 160 10*3/uL (ref 150–400)
RBC: 3.99 MIL/uL (ref 3.87–5.11)
RDW: 14.5 % (ref 11.5–15.5)
WBC: 5.1 10*3/uL (ref 4.0–10.5)

## 2014-11-22 LAB — INFLUENZA PANEL BY PCR (TYPE A & B)
H1N1 flu by pcr: NOT DETECTED
Influenza A By PCR: NEGATIVE
Influenza B By PCR: NEGATIVE

## 2014-11-22 LAB — CLOSTRIDIUM DIFFICILE BY PCR: Toxigenic C. Difficile by PCR: NEGATIVE

## 2014-11-22 LAB — LACTIC ACID, PLASMA: LACTIC ACID, VENOUS: 1.8 mmol/L (ref 0.5–2.0)

## 2014-11-22 LAB — CORTISOL: Cortisol, Plasma: 29.4 ug/dL

## 2014-11-22 LAB — MRSA PCR SCREENING: MRSA by PCR: NEGATIVE

## 2014-11-22 MED ORDER — INSULIN ASPART 100 UNIT/ML IV SOLN
10.0000 [IU] | Freq: Once | INTRAVENOUS | Status: AC
  Administered 2014-11-22: 10 [IU] via INTRAVENOUS

## 2014-11-22 MED ORDER — SODIUM BICARBONATE 8.4 % IV SOLN
INTRAVENOUS | Status: DC
  Administered 2014-11-22 (×2): via INTRAVENOUS
  Filled 2014-11-22 (×4): qty 1000

## 2014-11-22 MED ORDER — HEPARIN SODIUM (PORCINE) 5000 UNIT/ML IJ SOLN
5000.0000 [IU] | Freq: Three times a day (TID) | INTRAMUSCULAR | Status: DC
  Administered 2014-11-22 – 2014-11-24 (×5): 5000 [IU] via SUBCUTANEOUS
  Filled 2014-11-22 (×7): qty 1

## 2014-11-22 MED ORDER — ENOXAPARIN SODIUM 40 MG/0.4ML ~~LOC~~ SOLN
40.0000 mg | SUBCUTANEOUS | Status: DC
  Filled 2014-11-22: qty 0.4

## 2014-11-22 MED ORDER — SODIUM POLYSTYRENE SULFONATE 15 GM/60ML PO SUSP
45.0000 g | Freq: Once | ORAL | Status: AC
  Administered 2014-11-22: 45 g via ORAL
  Filled 2014-11-22: qty 180

## 2014-11-22 MED ORDER — PREDNISONE 2.5 MG PO TABS
2.5000 mg | ORAL_TABLET | Freq: Every day | ORAL | Status: DC
  Filled 2014-11-22: qty 1

## 2014-11-22 MED ORDER — CIPROFLOXACIN IN D5W 400 MG/200ML IV SOLN
400.0000 mg | INTRAVENOUS | Status: DC
  Administered 2014-11-22 – 2014-11-23 (×2): 400 mg via INTRAVENOUS
  Filled 2014-11-22 (×2): qty 200

## 2014-11-22 MED ORDER — ENOXAPARIN SODIUM 30 MG/0.3ML ~~LOC~~ SOLN
30.0000 mg | Freq: Every day | SUBCUTANEOUS | Status: DC
  Filled 2014-11-22: qty 0.3

## 2014-11-22 MED ORDER — SODIUM CHLORIDE 0.9 % IV SOLN
1.0000 g | Freq: Once | INTRAVENOUS | Status: AC
  Administered 2014-11-22: 1 g via INTRAVENOUS
  Filled 2014-11-22: qty 10

## 2014-11-22 MED ORDER — METRONIDAZOLE IN NACL 5-0.79 MG/ML-% IV SOLN
500.0000 mg | Freq: Three times a day (TID) | INTRAVENOUS | Status: DC
  Administered 2014-11-22 – 2014-11-23 (×4): 500 mg via INTRAVENOUS
  Filled 2014-11-22 (×5): qty 100

## 2014-11-22 MED ORDER — ALPRAZOLAM 0.5 MG PO TABS
0.5000 mg | ORAL_TABLET | Freq: Three times a day (TID) | ORAL | Status: DC | PRN
  Administered 2014-11-22 – 2014-11-24 (×5): 0.5 mg via ORAL
  Filled 2014-11-22 (×5): qty 1

## 2014-11-22 MED ORDER — DEXTROSE 50 % IV SOLN
25.0000 g | Freq: Once | INTRAVENOUS | Status: AC
  Administered 2014-11-22: 25 g via INTRAVENOUS
  Filled 2014-11-22: qty 50

## 2014-11-22 MED ORDER — ACETAMINOPHEN 650 MG RE SUPP
650.0000 mg | Freq: Four times a day (QID) | RECTAL | Status: DC | PRN
  Filled 2014-11-22: qty 1

## 2014-11-22 MED ORDER — HYDROCORTISONE NA SUCCINATE PF 100 MG IJ SOLR
25.0000 mg | Freq: Once | INTRAMUSCULAR | Status: AC
  Administered 2014-11-22: 25 mg via INTRAVENOUS
  Filled 2014-11-22: qty 0.5

## 2014-11-22 MED ORDER — HYDRALAZINE HCL 20 MG/ML IJ SOLN
10.0000 mg | INTRAMUSCULAR | Status: DC | PRN
  Administered 2014-11-22 (×2): 10 mg via INTRAVENOUS
  Filled 2014-11-22 (×2): qty 1

## 2014-11-22 MED ORDER — METHOCARBAMOL 500 MG PO TABS
500.0000 mg | ORAL_TABLET | Freq: Two times a day (BID) | ORAL | Status: DC | PRN
  Filled 2014-11-22: qty 1

## 2014-11-22 MED ORDER — ACETAMINOPHEN 325 MG PO TABS
650.0000 mg | ORAL_TABLET | Freq: Four times a day (QID) | ORAL | Status: DC | PRN
  Administered 2014-11-22: 650 mg via ORAL
  Filled 2014-11-22: qty 2

## 2014-11-22 MED ORDER — FAMOTIDINE 20 MG PO TABS
20.0000 mg | ORAL_TABLET | Freq: Two times a day (BID) | ORAL | Status: DC
  Administered 2014-11-22 – 2014-11-23 (×3): 20 mg via ORAL
  Filled 2014-11-22 (×5): qty 1

## 2014-11-22 MED ORDER — DEXTROSE 50 % IV SOLN
1.0000 | Freq: Once | INTRAVENOUS | Status: AC
  Administered 2014-11-22: 50 mL via INTRAVENOUS
  Filled 2014-11-22: qty 50

## 2014-11-22 MED ORDER — PREDNISONE 5 MG PO TABS
5.0000 mg | ORAL_TABLET | Freq: Every day | ORAL | Status: DC
  Administered 2014-11-23 – 2014-11-24 (×2): 5 mg via ORAL
  Filled 2014-11-22 (×2): qty 1

## 2014-11-22 MED ORDER — ONDANSETRON HCL 4 MG PO TABS
4.0000 mg | ORAL_TABLET | Freq: Four times a day (QID) | ORAL | Status: DC | PRN
  Filled 2014-11-22: qty 1

## 2014-11-22 MED ORDER — CILOSTAZOL 50 MG PO TABS
50.0000 mg | ORAL_TABLET | Freq: Two times a day (BID) | ORAL | Status: DC
  Administered 2014-11-22 – 2014-11-24 (×5): 50 mg via ORAL
  Filled 2014-11-22 (×7): qty 1

## 2014-11-22 MED ORDER — OMEGA-3-ACID ETHYL ESTERS 1 G PO CAPS
1.0000 g | ORAL_CAPSULE | Freq: Every day | ORAL | Status: DC
  Administered 2014-11-22 – 2014-11-24 (×3): 1 g via ORAL
  Filled 2014-11-22 (×4): qty 1

## 2014-11-22 MED ORDER — SODIUM CHLORIDE 0.45 % IV SOLN: INTRAVENOUS | Status: DC

## 2014-11-22 MED ORDER — ONDANSETRON HCL 4 MG/2ML IJ SOLN
4.0000 mg | Freq: Four times a day (QID) | INTRAMUSCULAR | Status: DC | PRN
  Administered 2014-11-22 (×2): 4 mg via INTRAVENOUS
  Filled 2014-11-22 (×2): qty 2

## 2014-11-22 MED ORDER — HYDROCODONE-ACETAMINOPHEN 10-325 MG PO TABS: 1.0000 | ORAL_TABLET | Freq: Four times a day (QID) | ORAL | Status: DC | PRN

## 2014-11-22 MED ORDER — AMLODIPINE BESYLATE 10 MG PO TABS
10.0000 mg | ORAL_TABLET | Freq: Every day | ORAL | Status: DC
  Administered 2014-11-22 – 2014-11-24 (×3): 10 mg via ORAL
  Filled 2014-11-22 (×4): qty 1

## 2014-11-22 MED ORDER — LOPERAMIDE HCL 2 MG PO CAPS
2.0000 mg | ORAL_CAPSULE | ORAL | Status: DC | PRN
  Filled 2014-11-22: qty 1

## 2014-11-22 NOTE — Care Management Note (Signed)
    Page 1 of 1   11/22/2014     8:40:50 AM CARE MANAGEMENT NOTE 11/22/2014  Patient:  [name], [name]   Account Number:  [id number]  Date Initiated:  11/22/2014  Documentation initiated by:  Elissa Hefty  Subjective/Objective Assessment:   adm w hyperkalemia     Action/Plan:   lives at home   Anticipated DC Date:     Anticipated DC Plan:  HOME/SELF CARE         Choice offered to / List presented to:             Status of service:   Medicare Important Message given?   (If response is "NO", the following Medicare IM given date fields will be blank) Date Medicare IM given:   Medicare IM given by:   Date Additional Medicare IM given:   Additional Medicare IM given by:    Discharge Disposition:    Per UR Regulation:  Reviewed for med. necessity/level of care/duration of stay  If discussed at [place] of Stay Meetings, dates discussed:    Comments:

## 2014-11-22 NOTE — Consult Note (Signed)
Referring Provider: No ref. provider found Primary Care Physician:  Default, Provider, MD Primary Nephrologist:    Reason for Consultation:  Hyperkalemia renal insufficiency and metabolic acidosis HPI:  This is a delightful lady with elevated creatinine and potassium. Baseline chronic renal insufficiency of 1.7  Urinalysis was negative for protein. No hydronephrosis on U/S. She presented with diarrheal illness with no vomiting. She is incontinent of stool and urine. CDiff was negative. Appropriate therapy for the hyperkalemia was initiated. She was taking scheduled doses of etodalac NSAID and spinolactone. Both of which will cause Hyperkalemia and renal failure.  Past Medical History  Diagnosis Date  . Hypertension   . Hypercholesteremia   . Arthritis   . Heart murmur     long time ago--no problems  . Chronic kidney disease     had some renal insufficiency.  Sees Dr. Moshe Cipro who took her off benicar in 2013.               Past Surgical History  Procedure Laterality Date  . Joint replacement      both knees  . Failed knee surgery      on the right x 2  . Cataract extraction w/phaco Right 03/20/2014    Procedure: CATARACT EXTRACTION PHACO AND INTRAOCULAR LENS PLACEMENT (IOC);  Surgeon: [name], MD;  Location: [place];  Service: Ophthalmology;  Laterality: Right;    Prior to Admission medications   Medication Sig Start Date End Date Taking? Authorizing Provider  ALPRAZolam Duanne Moron) 0.5 MG tablet Take 0.5 mg by mouth 3 (three) times daily as needed. For anxiety   Yes Historical Provider, MD  amLODipine (NORVASC) 10 MG tablet Take 10 mg by mouth daily.   Yes Historical Provider, MD  aspirin EC 81 MG tablet Take 81 mg by mouth daily.   Yes Historical Provider, MD  Cholecalciferol 50000 UNITS TABS Take 1 capsule by mouth every Monday.   Yes Historical Provider, MD  cilostazol (PLETAL) 100 MG tablet Take 50 mg by mouth 2 (two) times daily.  09/03/14  Yes Historical Provider, MD   etodolac (LODINE) 400 MG tablet Take 400 mg by mouth 2 (two) times daily. 11/04/14  Yes Historical Provider, MD  famotidine (PEPCID) 20 MG tablet Take 20 mg by mouth 2 (two) times daily.   Yes Historical Provider, MD  furosemide (LASIX) 40 MG tablet Take 40 mg by mouth 2 (two) times daily.    Yes Historical Provider, MD  HYDROcodone-acetaminophen (NORCO) 10-325 MG per tablet Take 1 tablet by mouth every 6 (six) hours as needed for severe pain.  10/02/14  Yes Historical Provider, MD  loperamide (IMODIUM) 2 MG capsule Take 2 mg by mouth as needed for diarrhea or loose stools.   Yes Historical Provider, MD  methocarbamol (ROBAXIN) 500 MG tablet Take 500 mg by mouth 2 (two) times daily as needed for muscle spasms.   Yes Historical Provider, MD  Multiple Vitamin (MULTIVITAMIN WITH MINERALS) TABS Take 1 tablet by mouth daily.   Yes Historical Provider, MD  Omega-3 Fatty Acids (FISH OIL PO) Take 1 capsule by mouth daily.   Yes Historical Provider, MD  predniSONE (DELTASONE) 5 MG tablet Take 2.5 mg by mouth daily.    Yes Historical Provider, MD  spironolactone (ALDACTONE) 25 MG tablet Take 25 mg by mouth daily. 11/04/14  Yes Historical Provider, MD    Current Facility-Administered Medications  Medication Dose Route Frequency Provider Last Rate Last Dose  . acetaminophen (TYLENOL) tablet 650 mg  650 mg Oral  Q6H PRN Rise Patience, MD   650 mg at 11/22/14 [id number]   Or  . acetaminophen (TYLENOL) suppository 650 mg  650 mg Rectal Q6H PRN Rise Patience, MD      . ALPRAZolam Duanne Moron) tablet 0.5 mg  0.5 mg Oral TID PRN Rise Patience, MD   0.5 mg at 11/22/14 1432  . amLODipine (NORVASC) tablet 10 mg  10 mg Oral Daily Rise Patience, MD   10 mg at 11/22/14 1024  . cilostazol (PLETAL) tablet 50 mg  50 mg Oral BID Rise Patience, MD   50 mg at 11/22/14 1027  . ciprofloxacin (CIPRO) IVPB 400 mg  400 mg Intravenous Q24H Laren Everts, RPH   Stopped at 11/22/14 0401  . dextrose 5 % 1,000 mL with  sodium bicarbonate 150 mEq infusion   Intravenous Continuous Jonetta Osgood, MD 125 mL/hr at 11/22/14 1028    . famotidine (PEPCID) tablet 20 mg  20 mg Oral BID Rise Patience, MD   20 mg at 11/22/14 1025  . heparin injection 5,000 Units  5,000 Units Subcutaneous 3 times per day Jonetta Osgood, MD   5,000 Units at 11/22/14 1326  . hydrALAZINE (APRESOLINE) injection 10 mg  10 mg Intravenous Q4H PRN Rise Patience, MD      . HYDROcodone-acetaminophen (NORCO) 10-325 MG per tablet 1 tablet  1 tablet Oral Q6H PRN Rise Patience, MD      . loperamide (IMODIUM) capsule 2 mg  2 mg Oral PRN Shanker Kristeen Mans, MD      . methocarbamol (ROBAXIN) tablet 500 mg  500 mg Oral BID PRN Rise Patience, MD      . metroNIDAZOLE (FLAGYL) IVPB 500 mg  500 mg Intravenous Q8H Rise Patience, MD 100 mL/hr at 11/22/14 1028 500 mg at 11/22/14 1028  . morphine 4 MG/ML injection 4 mg  4 mg Intravenous Once Lacretia Leigh, MD      . omega-3 acid ethyl esters (LOVAZA) capsule 1 g  1 g Oral Daily Rise Patience, MD   1 g at 11/22/14 1025  . ondansetron (ZOFRAN) tablet 4 mg  4 mg Oral Q6H PRN Rise Patience, MD       Or  . ondansetron [facility]) injection 4 mg  4 mg Intravenous Q6H PRN Rise Patience, MD   4 mg at 11/22/14 0809  . [START ON 11/23/2014] predniSONE (DELTASONE) tablet 5 mg  5 mg Oral Daily Jonetta Osgood, MD        Allergies as of 11/21/2014 - Review Complete 11/21/2014  Allergen Reaction Noted  . Neosporin [neomycin-bacitracin zn-polymyx] Itching 05/17/2013    Family History  Problem Relation Age of Onset  . Diabetes Mellitus II Daughter   . Hypertension Daughter     History   Social History  . Marital Status: Single    Spouse Name: N/A  . Number of Children: N/A  . Years of Education: N/A   Occupational History  . Not on file.   Social History Main Topics  . Smoking status: Never Smoker   . Smokeless tobacco: Not on file  . Alcohol Use: Yes      Comment: occasional  . Drug Use: No  . Sexual Activity: Not on file   Other Topics Concern  . Not on file   Social History Narrative    Review of Systems: Gen: Denies any fever, chills, sweats, anorexia, fatigue, weakness, malaise, weight loss, and sleep disorder  HEENT: No visual complaints, No history of Retinopathy. Normal external appearance No Epistaxis or Sore throat. No sinusitis.   CV: Denies chest pain, angina, palpitations, syncope, orthopnea, PND, peripheral edema, and claudication. Resp: Denies dyspnea at rest, dyspnea with exercise, cough, sputum, wheezing, coughing up blood, and pleurisy. GI: Diarrhea GU : Denies urinary burning, blood in urine, urinary frequency, urinary hesitancy, nocturnal urination, and urinary incontinence.  No renal calculi. MS: Denies joint pain, limitation of movement, and swelling, stiffness, low back pain, extremity pain. Denies muscle weakness, cramps, atrophy.  No use of non steroidal antiinflammatory drugs. Derm: Denies rash, itching, dry skin, hives, moles, warts, or unhealing ulcers.  Psych: Denies depression, anxiety, memory loss, suicidal ideation, hallucinations, paranoia, and confusion. Heme: Denies bruising, bleeding, and enlarged lymph nodes. Neuro: No headache.  No diplopia. No dysarthria.  No dysphasia.  No history of CVA.  No Seizures. No paresthesias.  No weakness. Endocrine No DM.  No Thyroid disease.  No Adrenal disease.  Physical Exam: Vital signs in last 24 hours: Temp:  [97.8 F (36.6 C)-100.9 F (38.3 C)] 98.6 F (37 C) (03/25 1636) Pulse Rate:  [64-107] 78 (03/25 1636) Resp:  [13-30] 15 (03/25 1636) BP: (128-193)/(19-60) 179/19 mmHg (03/25 1636) SpO2:  [91 %-100 %] 99 % (03/25 1636) Weight:  [105 kg (231 lb 7.7 oz)] 105 kg (231 lb 7.7 oz) (03/25 0737) Last BM Date: 11/22/14 General:   Alert,  Elderly  Head:  Normocephalic and atraumatic. Eyes:  Sclera clear, no icterus.   Conjunctiva pink. Ears:  Normal auditory  acuity. Nose:  No deformity, discharge,  or lesions. Mouth:  No deformity or lesions, dentition normal. Neck:  Supple; no masses or thyromegaly. JVP not elevated Lungs:  Clear throughout to auscultation.   No wheezes, crackles, or rhonchi. No acute distress. Heart:  Regular rate and rhythm;  Systolic murmur Abdomen:  Soft, nontender and nondistended. No masses, hepatosplenomegaly or hernias noted. Normal bowel sounds, without guarding, and without rebound.   Msk:  Symmetrical without gross deformities. Normal posture. Pulses:  No carotid, renal, femoral bruits. DP and PT symmetrical and equal Extremities:  Without clubbing or edema. Neurologic:  Alert and  oriented x4;  grossly normal neurologically. Skin:  Intact without significant lesions or rashes. Cervical Nodes:  No significant cervical adenopathy. Psych:  Alert and cooperative. Normal mood and affect.  Intake/Output from previous day: 03/24 0701 - 03/25 0700 In: 75 [I.V.:75] Out: -  Intake/Output this shift: Total I/O In: 1051.7 [I.V.:841.7; IV Piggyback:210] Out: -   Lab Results:  Recent Labs  11/21/14 1711 11/22/14 0550  WBC 7.5 5.1  HGB 12.4 10.5*  HCT 40.2 34.4*  PLT 183 160   BMET  Recent Labs  11/22/14 0020 11/22/14 0550 11/22/14 1140  NA 140 140 145  K 5.3* 6.0* 6.5*  CL 116* 116* 120*  CO2 16* 15* 16*  GLUCOSE 66* 109* 78  BUN 39* 39* 37*  CREATININE 2.39* 2.50* 2.51*  CALCIUM 9.1 9.5 9.9   LFT  Recent Labs  11/21/14 1711  PROT 7.4  ALBUMIN 3.7  AST 24  ALT 16  ALKPHOS 69  BILITOT 0.7   PT/INR No results for input(s): LABPROT, INR in the last 72 hours. Hepatitis Panel No results for input(s): HEPBSAG, HCVAB, HEPAIGM, HEPBIGM in the last 72 hours.  Studies/Results: Ct Abdomen Pelvis Wo Contrast  11/21/2014   CLINICAL DATA:  Lower abdominal pain with diarrhea  EXAM: CT ABDOMEN AND PELVIS WITHOUT CONTRAST  TECHNIQUE: Multidetector CT imaging  of the abdomen and pelvis was performed  following the standard protocol without IV contrast.  COMPARISON:  11/15/07  FINDINGS: Lung bases are free of acute infiltrate or sizable effusion. Some stable scarring is noted in the medial right lung base.  The gallbladder is decompressed with multiple gallstones within. The liver, spleen, right adrenal gland and kidneys are within normal limits. No renal calculi are seen. A left adrenal lesion is again noted and stable measuring approximately 1 cm. This likely represents an adenoma. The collecting systems are well visualized without obstructive change. There is however a 5 mm stone identified at the right ureterovesical junction which was not seen on the prior exam. No significant obstructive changes noted.  The bladder is well distended. No pelvic mass lesion is seen. Degenerative changes of the lumbar spine are seen. The appendix is within normal limits. Multiple colonic diverticular noted without evidence of diverticulitis.  IMPRESSION: 5 mm distal right ureteral stone without significant obstructive change.  Multiple gallstones stable from the prior exam.   Electronically Signed   By: Inez Catalina M.D.   On: 11/21/2014 21:11   US Abdomen Complete  11/22/2014   CLINICAL DATA:  Nausea and vomiting.  EXAM: ULTRASOUND ABDOMEN COMPLETE  COMPARISON:  CT 1 day prior at 2055 hour  FINDINGS: Gallbladder: Multiple gallstones with posterior shadowing creating a wall echo shadow complex. Normal wall thickness of 1.5 mm. Sonographic Percell Miller sign is negative.  Common bile duct: Diameter: 5 mm  Liver: No focal lesion identified. Within normal limits in parenchymal echogenicity. Portions of the liver obscured.  IVC: No abnormality visualized.  Pancreas: Not visualized due to bowel gas.  Spleen: Normal in size measuring 10.1 cm. There is a rounded 3.7 x 4.3 x 3.7 cm echogenic lesion in the mid spleen. No internal blood flow.  Right Kidney: Length: 9.5 cm. Echogenicity within normal limits. No mass or hydronephrosis  visualized.  Left Kidney: Length: 10.6 cm. Echogenicity within normal limits. No mass or hydronephrosis visualized.  Abdominal aorta: No aneurysm visualized.  Limited visualization.  Other findings: No ascites.  IMPRESSION: 1. Multiple gallstones with posterior shadowing creating a wall echo shadow complex. No sonographic findings of acute cholecystitis. 2. Echogenic lesion in the spleen measuring 4.3 cm. This was not seen on CT. In the absence of known malignancy, this is likely benign and may reflect a splenic hemangioma. Follow-up ultrasound could be considered in 6 months to evaluate for change.   Electronically Signed   By: Jeb Levering M.D.   On: 11/22/2014 05:26   Dg Chest Port 1 View  11/21/2014   CLINICAL DATA:  Fever, abdominal pain and diarrhea.  EXAM: PORTABLE CHEST - 1 VIEW  COMPARISON:  03/15/2014  FINDINGS: Lung volumes are low. Heart size is normal, there is atherosclerosis of the aorta. No consolidation, pleural effusion, and pulmonary edema. Degenerative change in the spine.  IMPRESSION: Hypoventilatory chest, no acute process.   Electronically Signed   By: Jeb Levering M.D.   On: 11/21/2014 23:50    Assessment/Plan: 1. Acute renal failure : secondary to volume depletion from diarrhea. Agree with IVF and place foley catheter and would recommend the monitoring of renal fuction 2. Chronic renal failure   Secondary to nephrosclerosis 3. Hyperkalemia  Secondary to renal failure, NSAIDS and Spirinolactone 4. Metabolic acidosis secondary to diarrhea. Agree with IV bicarbonate   LOS: 1 Brennin Durfee W @TODAY @5 :04 PM

## 2014-11-22 NOTE — ED Notes (Signed)
Pt has had two episodes of diarrhea starting at 0520. Denies pain, reports stomach is "rolling"

## 2014-11-22 NOTE — ED Notes (Signed)
Pt vomiting into emesis bag; appears to be kayexalate. Dr.Kakrakandy aware. zofran given per orders

## 2014-11-22 NOTE — Progress Notes (Signed)
PATIENT DETAILS Name: [name] Age: 81 y.o. Sex: female Date of Birth: 07-29-1929 Admit Date: 11/21/2014 Admitting Physician Rise Patience, MD [id number], Provider, MD  Subjective: Nausea, no vomiting. Diarrhea continues.   Assessment/Plan: Active Problems:   Diarrhea: ?etiology, C Diff PCR negative. Continue empiric Abx and supportive care. Follow stool culture    Acute on CKD stage 3: ARF secondary to pre-renal azotemia from above along with diuretic use. Increase IVF. Follow lytes    Hyperkalemia: persistent, given Insulin D50 and placed on IV Bicarb on admit. Will retreat with IV Insulin, Kayexalate. Increase bicarb in IVF.    Metabolic Acidosis:secondary to diarrhea and ARF. Continue IV Bicarb    [id number], continue Amlodipine. Aldactone discontinued due to hyperkalemia.    Arthritis: on chronic prednisone, will double dose to 5 mg while acute ill.     Cholelithiasis: belly soft and non tender. No clinical features suggestive of acute cholecystitis.     5 mm Distal Right Ureteral Stone:no hydronephroses on CT scan abdomen and ultrasound abdomen, no flank pain. Suspect renal failure secondary to prerenal azotemia with diuretic use rather than distal right ureteral stone. Will continue to monitor closely  Disposition: Remain inpatient  Antibiotics:  See below   Anti-infectives    Start     Dose/Rate Route Frequency Ordered Stop   11/22/14 0300  ciprofloxacin (CIPRO) IVPB 400 mg     400 mg 200 mL/hr over 60 Minutes Intravenous Every 24 hours 11/22/14 0207     11/22/14 0200  metroNIDAZOLE (FLAGYL) IVPB 500 mg     500 mg 100 mL/hr over 60 Minutes Intravenous Every 8 hours 11/22/14 0154        DVT Prophylaxis: Prophylactic Heparin  Code Status: Full code  Family Communication None at bedside  Procedures:  None  CONSULTS:  None  Time spent 40 minutes-which includes 50% of the time with face-to-face with patient/ family and  coordinating care related to the above assessment and plan.  MEDICATIONS: Scheduled Meds: . amLODipine  10 mg Oral Daily  . cilostazol  50 mg Oral BID  . ciprofloxacin  400 mg Intravenous Q24H  . enoxaparin (LOVENOX) injection  30 mg Subcutaneous Daily  . famotidine  20 mg Oral BID  . metronidazole  500 mg Intravenous Q8H  .  morphine injection  4 mg Intravenous Once  . omega-3 acid ethyl esters  1 g Oral Daily  . [START ON 11/23/2014] predniSONE  5 mg Oral Daily   Continuous Infusions: . dextrose 5 % 1,000 mL with sodium bicarbonate 150 mEq infusion 125 mL/hr at 11/22/14 1028   PRN Meds:.acetaminophen **OR** acetaminophen, ALPRAZolam, hydrALAZINE, HYDROcodone-acetaminophen, methocarbamol, ondansetron **OR** ondansetron (ZOFRAN) IV    PHYSICAL EXAM: Vital signs in last 24 hours: Filed Vitals:   11/22/14 0323 11/22/14 0605 11/22/14 0737 11/22/14 1024  BP: 137/39 163/44 128/22 153/57  Pulse: 101 76 70   Temp: 100.9 F (38.3 C) 99 F (37.2 C) 98.5 F (36.9 C)   TempSrc: Oral Oral Oral   Resp: 24 20 13    Height:   5' 5.35" (1.66 m)   Weight:   105 kg (231 lb 7.7 oz)   SpO2: 93% 95% 95%     Weight change:  Filed Weights   11/22/14 0737  Weight: 105 kg (231 lb 7.7 oz)   Body mass index is 38.1 kg/(m^2).   Gen Exam: Awake and alert with clear speech.   Neck: Supple, No JVD.  Chest: B/L Clear.   CVS: S1 S2 Regular, no murmurs.  Abdomen: soft, BS +, non tender, non distended. No CVA tenderness Extremities: no edema, lower extremities warm to touch Neurologic: Non Focal.   Skin: No Rash.   Wounds: N/A.    Intake/Output from previous day:  Intake/Output Summary (Last 24 hours) at 11/22/14 1106 Last data filed at 11/22/14 1028  Gross per 24 hour  Intake    325 ml  Output      0 ml  Net    325 ml     LAB RESULTS: CBC  Recent Labs Lab 11/21/14 1711 11/22/14 0550  WBC 7.5 5.1  HGB 12.4 10.5*  HCT 40.2 34.4*  PLT 183 160  MCV 85.9 86.0  MCH 26.5 25.8*    MCHC 30.8 30.0  RDW 14.5 14.5  LYMPHSABS 1.1 0.6*  MONOABS 0.2 0.2  EOSABS 0.1 0.0  BASOSABS 0.0 0.0    Chemistries   Recent Labs Lab 11/21/14 1711 11/21/14 2012 11/22/14 0020 11/22/14 0550  NA 138  --  140 140  K 6.4* 6.7* 5.3* 6.0*  CL 110  --  116* 116*  CO2 16*  --  16* 15*  GLUCOSE 124*  --  66* 109*  BUN 43*  --  39* 39*  CREATININE 2.76*  --  2.39* 2.50*  CALCIUM 10.0  --  9.1 9.5    CBG: No results for input(s): GLUCAP in the last 168 hours.  GFR Estimated Creatinine Clearance: 20.6 mL/min (by C-G formula based on Cr of 2.5).  Coagulation profile No results for input(s): INR, PROTIME in the last 168 hours.  Cardiac Enzymes No results for input(s): CKMB, TROPONINI, MYOGLOBIN in the last 168 hours.  Invalid input(s): CK  Invalid input(s): POCBNP No results for input(s): DDIMER in the last 72 hours. No results for input(s): HGBA1C in the last 72 hours. No results for input(s): CHOL, HDL, LDLCALC, TRIG, CHOLHDL, LDLDIRECT in the last 72 hours. No results for input(s): TSH, T4TOTAL, T3FREE, THYROIDAB in the last 72 hours.  Invalid input(s): FREET3 No results for input(s): VITAMINB12, FOLATE, FERRITIN, TIBC, IRON, RETICCTPCT in the last 72 hours.  Recent Labs  11/21/14 1711  LIPASE 33    Urine Studies No results for input(s): UHGB, CRYS in the last 72 hours.  Invalid input(s): UACOL, UAPR, USPG, UPH, UTP, UGL, UKET, UBIL, UNIT, UROB, ULEU, UEPI, UWBC, URBC, UBAC, CAST, UCOM, BILUA  MICROBIOLOGY: Recent Results (from the past 240 hour(s))  Clostridium Difficile by PCR     Status: None   Collection Time: 11/22/14  6:12 AM  Result Value Ref Range Status   C difficile by pcr NEGATIVE NEGATIVE Final  MRSA PCR Screening     Status: None   Collection Time: 11/22/14  6:41 AM  Result Value Ref Range Status   MRSA by PCR NEGATIVE NEGATIVE Final    Comment:        The GeneXpert MRSA Assay (FDA approved for NASAL specimens only), is one component of  a comprehensive MRSA colonization surveillance program. It is not intended to diagnose MRSA infection nor to guide or monitor treatment for MRSA infections.     RADIOLOGY STUDIES/RESULTS: Ct Abdomen Pelvis Wo Contrast  11/21/2014   CLINICAL DATA:  Lower abdominal pain with diarrhea  EXAM: CT ABDOMEN AND PELVIS WITHOUT CONTRAST  TECHNIQUE: Multidetector CT imaging of the abdomen and pelvis was performed following the standard protocol without IV contrast.  COMPARISON:  11/15/07  FINDINGS: Lung bases are free  of acute infiltrate or sizable effusion. Some stable scarring is noted in the medial right lung base.  The gallbladder is decompressed with multiple gallstones within. The liver, spleen, right adrenal gland and kidneys are within normal limits. No renal calculi are seen. A left adrenal lesion is again noted and stable measuring approximately 1 cm. This likely represents an adenoma. The collecting systems are well visualized without obstructive change. There is however a 5 mm stone identified at the right ureterovesical junction which was not seen on the prior exam. No significant obstructive changes noted.  The bladder is well distended. No pelvic mass lesion is seen. Degenerative changes of the lumbar spine are seen. The appendix is within normal limits. Multiple colonic diverticular noted without evidence of diverticulitis.  IMPRESSION: 5 mm distal right ureteral stone without significant obstructive change.  Multiple gallstones stable from the prior exam.   Electronically Signed   By: Inez Catalina M.D.   On: 11/21/2014 21:11   US Abdomen Complete  11/22/2014   CLINICAL DATA:  Nausea and vomiting.  EXAM: ULTRASOUND ABDOMEN COMPLETE  COMPARISON:  CT 1 day prior at 2055 hour  FINDINGS: Gallbladder: Multiple gallstones with posterior shadowing creating a wall echo shadow complex. Normal wall thickness of 1.5 mm. Sonographic Percell Miller sign is negative.  Common bile duct: Diameter: 5 mm  Liver: No focal  lesion identified. Within normal limits in parenchymal echogenicity. Portions of the liver obscured.  IVC: No abnormality visualized.  Pancreas: Not visualized due to bowel gas.  Spleen: Normal in size measuring 10.1 cm. There is a rounded 3.7 x 4.3 x 3.7 cm echogenic lesion in the mid spleen. No internal blood flow.  Right Kidney: Length: 9.5 cm. Echogenicity within normal limits. No mass or hydronephrosis visualized.  Left Kidney: Length: 10.6 cm. Echogenicity within normal limits. No mass or hydronephrosis visualized.  Abdominal aorta: No aneurysm visualized.  Limited visualization.  Other findings: No ascites.  IMPRESSION: 1. Multiple gallstones with posterior shadowing creating a wall echo shadow complex. No sonographic findings of acute cholecystitis. 2. Echogenic lesion in the spleen measuring 4.3 cm. This was not seen on CT. In the absence of known malignancy, this is likely benign and may reflect a splenic hemangioma. Follow-up ultrasound could be considered in 6 months to evaluate for change.   Electronically Signed   By: Jeb Levering M.D.   On: 11/22/2014 05:26   Dg Chest Port 1 View  11/21/2014   CLINICAL DATA:  Fever, abdominal pain and diarrhea.  EXAM: PORTABLE CHEST - 1 VIEW  COMPARISON:  03/15/2014  FINDINGS: Lung volumes are low. Heart size is normal, there is atherosclerosis of the aorta. No consolidation, pleural effusion, and pulmonary edema. Degenerative change in the spine.  IMPRESSION: Hypoventilatory chest, no acute process.   Electronically Signed   By: Jeb Levering M.D.   On: 11/21/2014 23:50    Oren Binet, MD  [facility] Pager:[phone]  If 7PM-7AM, please contact night-coverage [email or web address] Password [email or web address] 11/22/2014, 11:06 AM   LOS: 1 day

## 2014-11-22 NOTE — Progress Notes (Addendum)
CRITICAL VALUE ALERT  Critical value received: K+  6.5  Date of notification:  11/22/14  Time of notification:  [id number]  Critical value read back:Yes.    Nurse who received alert:  Dennison Mascot MD notified (1st page):  Dr. Sloan Leiter Time of first page:  [phone]  Responding MD:  Dr. Sloan Leiter, new orders given, will continue to monitor.

## 2014-11-22 NOTE — Progress Notes (Signed)
Nutrition Brief Note  Patient identified on the Malnutrition Screening Tool (MST) Report (MST = 2)  Wt Readings from Last 15 Encounters:  11/22/14 231 lb 7.7 oz (105 kg)  03/20/14 232 lb (105.235 kg)    Body mass index is 38.1 kg/(m^2). Patient meets criteria for obese based on current BMI.    Current diet order is Heart Healthy, patient is consuming approximately NA% of meals at this time (Pt received her first tray since being in the hospital during the RD visit). Per Pt her appetite has been decreasing in the last 6 months. Pt reports about 9 Lb weight loss in the last couple of month, but this is not consistent with available weight records and according to her daughter who was also in the room. Nutrition Focused Physical Exam did not reveal any wasting or depletion, all areas were WDL. Pt reports being hungry and was excited to get her lunch. No educational needs were identified at this time. Labs and medications reviewed; K 6.5,  CL 120, BUN 37, Creatinine 2.51  No nutrition interventions warranted at this time. If nutrition issues arise, please consult RD.   [name] [facility] Dietetic Intern 11/22/2014 1:34 PM

## 2014-11-22 NOTE — Progress Notes (Signed)
Pt from ED due to N/V/D. Ruling out for flu and C diff; pt on droplet and enteric precautions. Pt A/Ox4; pt feeling nauseous and having pain in belly; pt stating her stomach is "rumbling". Will continue to monitor at this time.

## 2014-11-22 NOTE — ED Notes (Signed)
Antibiotics started; sodium chloride with bicarb paused until antibiotics finish per pharmacist recommendation

## 2014-11-22 NOTE — ED Notes (Signed)
Right hand significantly swollen after returning from [place]. Pulses and CMS intact.

## 2014-11-22 NOTE — Progress Notes (Signed)
81yo female c/o diarrhea w/ crampy abdominal pain, found to be hyperkalemic, concern for cholecystitis, to begin IV ABX.  Will begin Cipro 400mg  IV Q24H for CrCl ~20 ml/min and monitor CBC, [place], Cx.  Wynona Neat, PharmD, BCPS 11/22/2014 2:08 AM

## 2014-11-23 LAB — URINE CULTURE

## 2014-11-23 LAB — BASIC METABOLIC PANEL
ANION GAP: 6 (ref 5–15)
BUN: 28 mg/dL — ABNORMAL HIGH (ref 6–23)
CO2: 26 mmol/L (ref 19–32)
CREATININE: 2.09 mg/dL — AB (ref 0.50–1.10)
Calcium: 8.7 mg/dL (ref 8.4–10.5)
Chloride: 112 mmol/L (ref 96–112)
GFR calc Af Amer: 24 mL/min — ABNORMAL LOW (ref >90)
GFR, EST NON AFRICAN AMERICAN: 21 mL/min — AB (ref >90)
Glucose, Bld: 112 mg/dL — ABNORMAL HIGH (ref 70–99)
Potassium: 4 mmol/L (ref 3.5–5.1)
SODIUM: 144 mmol/L (ref 135–145)

## 2014-11-23 LAB — CBC
HCT: 33.8 % — ABNORMAL LOW (ref 36.0–46.0)
HEMOGLOBIN: 10.1 g/dL — AB (ref 12.0–15.0)
MCH: 25.8 pg — AB (ref 26.0–34.0)
MCHC: 29.9 g/dL — AB (ref 30.0–36.0)
MCV: 86.4 fL (ref 78.0–100.0)
Platelets: 126 10*3/uL — ABNORMAL LOW (ref 150–400)
RBC: 3.91 MIL/uL (ref 3.87–5.11)
RDW: 14.7 % (ref 11.5–15.5)
WBC: 4 10*3/uL (ref 4.0–10.5)

## 2014-11-23 MED ORDER — FAMOTIDINE 20 MG PO TABS
20.0000 mg | ORAL_TABLET | Freq: Every day | ORAL | Status: DC
  Administered 2014-11-24: 20 mg via ORAL
  Filled 2014-11-23: qty 1

## 2014-11-23 MED ORDER — SODIUM CHLORIDE 0.9 % IV SOLN
INTRAVENOUS | Status: AC
  Administered 2014-11-23 (×2): via INTRAVENOUS

## 2014-11-23 MED ORDER — HYDRALAZINE HCL 20 MG/ML IJ SOLN
10.0000 mg | INTRAMUSCULAR | Status: DC | PRN
  Filled 2014-11-23: qty 1

## 2014-11-23 NOTE — Progress Notes (Signed)
Patient arrived on unit from [facility] via wheelchair.  Family at bedside. Telemetry placed per MD order.

## 2014-11-23 NOTE — Progress Notes (Signed)
[facility] ROUNDING NOTE   Subjective:   Interval History: no complaints today   Daughter at bedside today . She sees Dr Moshe Cipro as an outpatient.  CKD stage 3    Objective:  Vital signs in last 24 hours:  Temp:  [97.8 F (36.6 C)-98.6 F (37 C)] 98.4 F (36.9 C) (03/26 1010) Pulse Rate:  [64-79] 69 (03/26 1010) Resp:  [12-24] 16 (03/26 1010) BP: (129-188)/(21-57) 159/31 mmHg (03/26 1010) SpO2:  [96 %-100 %] 99 % (03/26 1010) Weight:  [103.874 kg (229 lb)] 103.874 kg (229 lb) (03/26 0349)  Weight change:  Filed Weights   11/22/14 0737 11/23/14 0349  Weight: 105 kg (231 lb 7.7 oz) 103.874 kg (229 lb)    Intake/Output: I/O last 3 completed shifts: In: 3941.7 [P.O.:790; I.V.:2541.7; IV Piggyback:610] Out: -    Intake/Output this shift:  Total I/O In: 125 [I.V.:125] Out: -   CVS- RRR RS- CTA ABD- BS present soft non-distended EXT- no edema   Basic Metabolic Panel:  Recent Labs Lab 11/22/14 0020 11/22/14 0550 11/22/14 1140 11/22/14 1931 11/23/14 0237  NA 140 140 145 146* 144  K 5.3* 6.0* 6.5* 5.0 4.0  CL 116* 116* 120* 114* 112  CO2 16* 15* 16* 23 26  GLUCOSE 66* 109* 78 104* 112*  BUN 39* 39* 37* 33* 28*  CREATININE 2.39* 2.50* 2.51* 2.30* 2.09*  CALCIUM 9.1 9.5 9.9 10.1 8.7    Liver Function Tests:  Recent Labs Lab 11/21/14 1711  AST 24  ALT 16  ALKPHOS 69  BILITOT 0.7  PROT 7.4  ALBUMIN 3.7    Recent Labs Lab 11/21/14 1711  LIPASE 33   No results for input(s): AMMONIA in the last 168 hours.  CBC:  Recent Labs Lab 11/21/14 1711 11/22/14 0550 11/23/14 0237  WBC 7.5 5.1 4.0  NEUTROABS 6.1 4.4  --   HGB 12.4 10.5* 10.1*  HCT 40.2 34.4* 33.8*  MCV 85.9 86.0 86.4  PLT 183 160 126*    Cardiac Enzymes: No results for input(s): CKTOTAL, CKMB, CKMBINDEX, TROPONINI in the last 168 hours.  BNP: Invalid input(s): POCBNP  CBG: No results for input(s): GLUCAP in the last 168 hours.  Microbiology: Results for  orders placed or performed during the hospital encounter of 11/21/14  Urine culture     Status: None   Collection Time: 11/21/14 10:30 PM  Result Value Ref Range Status   Specimen Description URINE, RANDOM  Final   Special Requests NONE  Final   Colony Count   Final    4,000 COLONIES/ML Performed at [facility]    Culture   Final    INSIGNIFICANT GROWTH Performed at [facility]    Report Status 11/23/2014 FINAL  Final  Clostridium Difficile by PCR     Status: None   Collection Time: 11/22/14  6:12 AM  Result Value Ref Range Status   C difficile by pcr NEGATIVE NEGATIVE Final  MRSA PCR Screening     Status: None   Collection Time: 11/22/14  6:41 AM  Result Value Ref Range Status   MRSA by PCR NEGATIVE NEGATIVE Final    Comment:        The GeneXpert MRSA Assay (FDA approved for NASAL specimens only), is one component of a comprehensive MRSA colonization surveillance program. It is not intended to diagnose MRSA infection nor to guide or monitor treatment for MRSA infections.     Coagulation Studies: No results for input(s): LABPROT, INR in the last  72 hours.  Urinalysis:  Recent Labs  11/21/14 2230  COLORURINE YELLOW  LABSPEC 1.015  PHURINE 5.0  GLUCOSEU 100*  HGBUR TRACE*  BILIRUBINUR MODERATE*  KETONESUR NEGATIVE  PROTEINUR NEGATIVE  UROBILINOGEN 0.2  NITRITE NEGATIVE  LEUKOCYTESUR SMALL*      Imaging: Ct Abdomen Pelvis Wo Contrast  11/21/2014   CLINICAL DATA:  Lower abdominal pain with diarrhea  EXAM: CT ABDOMEN AND PELVIS WITHOUT CONTRAST  TECHNIQUE: Multidetector CT imaging of the abdomen and pelvis was performed following the standard protocol without IV contrast.  COMPARISON:  11/15/07  FINDINGS: Lung bases are free of acute infiltrate or sizable effusion. Some stable scarring is noted in the medial right lung base.  The gallbladder is decompressed with multiple gallstones within. The liver, spleen, right adrenal gland and kidneys are  within normal limits. No renal calculi are seen. A left adrenal lesion is again noted and stable measuring approximately 1 cm. This likely represents an adenoma. The collecting systems are well visualized without obstructive change. There is however a 5 mm stone identified at the right ureterovesical junction which was not seen on the prior exam. No significant obstructive changes noted.  The bladder is well distended. No pelvic mass lesion is seen. Degenerative changes of the lumbar spine are seen. The appendix is within normal limits. Multiple colonic diverticular noted without evidence of diverticulitis.  IMPRESSION: 5 mm distal right ureteral stone without significant obstructive change.  Multiple gallstones stable from the prior exam.   Electronically Signed   By: Inez Catalina M.D.   On: 11/21/2014 21:11   US Abdomen Complete  11/22/2014   CLINICAL DATA:  Nausea and vomiting.  EXAM: ULTRASOUND ABDOMEN COMPLETE  COMPARISON:  CT 1 day prior at 2055 hour  FINDINGS: Gallbladder: Multiple gallstones with posterior shadowing creating a wall echo shadow complex. Normal wall thickness of 1.5 mm. Sonographic Percell Miller sign is negative.  Common bile duct: Diameter: 5 mm  Liver: No focal lesion identified. Within normal limits in parenchymal echogenicity. Portions of the liver obscured.  IVC: No abnormality visualized.  Pancreas: Not visualized due to bowel gas.  Spleen: Normal in size measuring 10.1 cm. There is a rounded 3.7 x 4.3 x 3.7 cm echogenic lesion in the mid spleen. No internal blood flow.  Right Kidney: Length: 9.5 cm. Echogenicity within normal limits. No mass or hydronephrosis visualized.  Left Kidney: Length: 10.6 cm. Echogenicity within normal limits. No mass or hydronephrosis visualized.  Abdominal aorta: No aneurysm visualized.  Limited visualization.  Other findings: No ascites.  IMPRESSION: 1. Multiple gallstones with posterior shadowing creating a wall echo shadow complex. No sonographic findings of  acute cholecystitis. 2. Echogenic lesion in the spleen measuring 4.3 cm. This was not seen on CT. In the absence of known malignancy, this is likely benign and may reflect a splenic hemangioma. Follow-up ultrasound could be considered in 6 months to evaluate for change.   Electronically Signed   By: Jeb Levering M.D.   On: 11/22/2014 05:26   Dg Chest Port 1 View  11/21/2014   CLINICAL DATA:  Fever, abdominal pain and diarrhea.  EXAM: PORTABLE CHEST - 1 VIEW  COMPARISON:  03/15/2014  FINDINGS: Lung volumes are low. Heart size is normal, there is atherosclerosis of the aorta. No consolidation, pleural effusion, and pulmonary edema. Degenerative change in the spine.  IMPRESSION: Hypoventilatory chest, no acute process.   Electronically Signed   By: Jeb Levering M.D.   On: 11/21/2014 23:50     Medications:   .  sodium chloride 75 mL/hr at 11/23/14 [id number]   . amLODipine  10 mg Oral Daily  . cilostazol  50 mg Oral BID  . famotidine  20 mg Oral BID  . heparin subcutaneous  5,000 Units Subcutaneous 3 times per day  .  morphine injection  4 mg Intravenous Once  . omega-3 acid ethyl esters  1 g Oral Daily  . predniSONE  5 mg Oral Daily   acetaminophen **OR** acetaminophen, ALPRAZolam, hydrALAZINE, HYDROcodone-acetaminophen, loperamide, methocarbamol, ondansetron **OR** ondansetron (ZOFRAN) IV  Assessment/ Plan:  1. Acute renal failure : secondary to volume depletion from diarrhea. Agree with IVF and place foley catheter. Renal function is now much better and back to baseline 2. Chronic renal failure Secondary to nephrosclerosis 3. Hyperkalemia Secondary to renal failure, NSAIDS and Spirinolactone 4. Metabolic acidosis secondary to diarrhea. Resolved  I shall sign off after talking to Dr Sloan Leiter. I have reiterated to daughter and patient no spirinolactone, no NSAIDS , no ACE   No ARB     LOS: 2 Terius Jacuinde W @TODAY @10 :18 AM

## 2014-11-23 NOTE — Progress Notes (Signed)
PATIENT DETAILS Name: [name] Age: 81 y.o. Sex: female Date of Birth: [birth year] Admit Date: 11/21/2014 Admitting Physician Rise Patience, MD [id number], Provider, MD  Subjective: Feels better. Diarrhea improved. No abd pain   Assessment/Plan: Active Problems:   Diarrhea: ?etiology-suspect viral gastroenteritis, C Diff PCR negative. Empirically started on Cipro, Flagyl, since no fever, C Diff PCR negative, will stop all Abx and monitor. Continue  supportive care. Follow stool culture    Acute on CKD stage 3: ARF secondary to pre-renal azotemia from above along with diuretic/NSAID use. Improving with IVF. Follow lytes    Hyperkalemia: secondary to above and Aldactone/NSAID use. Finally resolved with multiple rounds of IV Insulin, Kayexalate and IV Bicarb. Will switch IVF to 0.9ns. Would avoid Aldactone in the future    Metabolic Acidosis:Resolved. Secondary to diarrhea and ARF. Treated with IV Bicarb, will change IVF to 0.9 NS.    [id number], continue Amlodipine. Aldactone discontinued due to hyperkalemia.    Arthritis: on chronic prednisone, will double dose to 5 mg while acute ill.     Cholelithiasis: belly soft and non tender. No clinical features suggestive of acute cholecystitis.     5 mm Distal Right Ureteral Stone:no hydronephroses on CT scan abdomen and ultrasound abdomen, no flank pain. Suspect renal failure secondary to prerenal azotemia with diuretic use rather than distal right ureteral stone. Will continue to monitor closely  Disposition: Remain inpatient-transfer to tele  Antibiotics:  See below   Anti-infectives    Start     Dose/Rate Route Frequency Ordered Stop   11/22/14 0300  ciprofloxacin (CIPRO) IVPB 400 mg  Status:  Discontinued     400 mg 200 mL/hr over 60 Minutes Intravenous Every 24 hours 11/22/14 0207 11/23/14 0751   11/22/14 0200  metroNIDAZOLE (FLAGYL) IVPB 500 mg  Status:  Discontinued     500 mg 100 mL/hr over 60  Minutes Intravenous Every 8 hours 11/22/14 0154 11/23/14 0751      DVT Prophylaxis: Prophylactic Heparin  Code Status: Full code  Family Communication None at bedside  Procedures:  None  CONSULTS:  None  MEDICATIONS: Scheduled Meds: . amLODipine  10 mg Oral Daily  . cilostazol  50 mg Oral BID  . famotidine  20 mg Oral BID  . heparin subcutaneous  5,000 Units Subcutaneous 3 times per day  .  morphine injection  4 mg Intravenous Once  . omega-3 acid ethyl esters  1 g Oral Daily  . predniSONE  5 mg Oral Daily   Continuous Infusions: . sodium chloride     PRN Meds:.acetaminophen **OR** acetaminophen, ALPRAZolam, hydrALAZINE, HYDROcodone-acetaminophen, loperamide, methocarbamol, ondansetron **OR** ondansetron (ZOFRAN) IV    PHYSICAL EXAM: Vital signs in last 24 hours: Filed Vitals:   11/23/14 0349 11/23/14 0400 11/23/14 0600 11/23/14 0745  BP:  168/22 164/24 165/26  Pulse: 73 71    Temp: 98.4 F (36.9 C)   98.5 F (36.9 C)  TempSrc: Oral   Oral  Resp: 12 14 15 15   Height:      Weight: 103.874 kg (229 lb)     SpO2: 100% 99%  98%    Weight change:  Filed Weights   11/22/14 0737 11/23/14 0349  Weight: 105 kg (231 lb 7.7 oz) 103.874 kg (229 lb)   Body mass index is 37.7 kg/(m^2).   Gen Exam: Awake and alert with clear speech.  Not in any distress Neck: Supple, No JVD.   Chest: B/L Clear.  No rales CVS: S1 S2 Regular, no murmurs.  Abdomen: soft, BS +, non tender, non distended. No CVA tenderness Extremities: no edema, lower extremities warm to touch Neurologic: Non Focal.   Skin: No Rash.   Wounds: N/A.    Intake/Output from previous day:  Intake/Output Summary (Last 24 hours) at 11/23/14 0806 Last data filed at 11/23/14 0700  Gross per 24 hour  Intake 3791.67 ml  Output      0 ml  Net 3791.67 ml     LAB RESULTS: CBC  Recent Labs Lab 11/21/14 1711 11/22/14 0550 11/23/14 0237  WBC 7.5 5.1 4.0  HGB 12.4 10.5* 10.1*  HCT 40.2 34.4* 33.8*    PLT 183 160 126*  MCV 85.9 86.0 86.4  MCH 26.5 25.8* 25.8*  MCHC 30.8 30.0 29.9*  RDW 14.5 14.5 14.7  LYMPHSABS 1.1 0.6*  --   MONOABS 0.2 0.2  --   EOSABS 0.1 0.0  --   BASOSABS 0.0 0.0  --     Chemistries   Recent Labs Lab 11/22/14 0020 11/22/14 0550 11/22/14 1140 11/22/14 1931 11/23/14 0237  NA 140 140 145 146* 144  K 5.3* 6.0* 6.5* 5.0 4.0  CL 116* 116* 120* 114* 112  CO2 16* 15* 16* 23 26  GLUCOSE 66* 109* 78 104* 112*  BUN 39* 39* 37* 33* 28*  CREATININE 2.39* 2.50* 2.51* 2.30* 2.09*  CALCIUM 9.1 9.5 9.9 10.1 8.7    CBG: No results for input(s): GLUCAP in the last 168 hours.  GFR Estimated Creatinine Clearance: 24.5 mL/min (by C-G formula based on Cr of 2.09).  Coagulation profile No results for input(s): INR, PROTIME in the last 168 hours.  Cardiac Enzymes No results for input(s): CKMB, TROPONINI, MYOGLOBIN in the last 168 hours.  Invalid input(s): CK  Invalid input(s): POCBNP No results for input(s): DDIMER in the last 72 hours. No results for input(s): HGBA1C in the last 72 hours. No results for input(s): CHOL, HDL, LDLCALC, TRIG, CHOLHDL, LDLDIRECT in the last 72 hours. No results for input(s): TSH, T4TOTAL, T3FREE, THYROIDAB in the last 72 hours.  Invalid input(s): FREET3 No results for input(s): VITAMINB12, FOLATE, FERRITIN, TIBC, IRON, RETICCTPCT in the last 72 hours.  Recent Labs  11/21/14 1711  LIPASE 33    Urine Studies No results for input(s): UHGB, CRYS in the last 72 hours.  Invalid input(s): UACOL, UAPR, USPG, UPH, UTP, UGL, UKET, UBIL, UNIT, UROB, ULEU, UEPI, UWBC, URBC, UBAC, CAST, UCOM, BILUA  MICROBIOLOGY: Recent Results (from the past 240 hour(s))  Urine culture     Status: None   Collection Time: 11/21/14 10:30 PM  Result Value Ref Range Status   Specimen Description URINE, RANDOM  Final   Special Requests NONE  Final   Colony Count   Final    4,000 COLONIES/ML Performed at [facility]    Culture   Final     INSIGNIFICANT GROWTH Performed at [facility]    Report Status 11/23/2014 FINAL  Final  Clostridium Difficile by PCR     Status: None   Collection Time: 11/22/14  6:12 AM  Result Value Ref Range Status   C difficile by pcr NEGATIVE NEGATIVE Final  MRSA PCR Screening     Status: None   Collection Time: 11/22/14  6:41 AM  Result Value Ref Range Status   MRSA by PCR NEGATIVE NEGATIVE Final    Comment:        The GeneXpert MRSA Assay (FDA approved for NASAL  specimens only), is one component of a comprehensive MRSA colonization surveillance program. It is not intended to diagnose MRSA infection nor to guide or monitor treatment for MRSA infections.     RADIOLOGY STUDIES/RESULTS: Ct Abdomen Pelvis Wo Contrast  11/21/2014   CLINICAL DATA:  Lower abdominal pain with diarrhea  EXAM: CT ABDOMEN AND PELVIS WITHOUT CONTRAST  TECHNIQUE: Multidetector CT imaging of the abdomen and pelvis was performed following the standard protocol without IV contrast.  COMPARISON:  11/15/07  FINDINGS: Lung bases are free of acute infiltrate or sizable effusion. Some stable scarring is noted in the medial right lung base.  The gallbladder is decompressed with multiple gallstones within. The liver, spleen, right adrenal gland and kidneys are within normal limits. No renal calculi are seen. A left adrenal lesion is again noted and stable measuring approximately 1 cm. This likely represents an adenoma. The collecting systems are well visualized without obstructive change. There is however a 5 mm stone identified at the right ureterovesical junction which was not seen on the prior exam. No significant obstructive changes noted.  The bladder is well distended. No pelvic mass lesion is seen. Degenerative changes of the lumbar spine are seen. The appendix is within normal limits. Multiple colonic diverticular noted without evidence of diverticulitis.  IMPRESSION: 5 mm distal right ureteral stone without  significant obstructive change.  Multiple gallstones stable from the prior exam.   Electronically Signed   By: Inez Catalina M.D.   On: 11/21/2014 21:11   US Abdomen Complete  11/22/2014   CLINICAL DATA:  Nausea and vomiting.  EXAM: ULTRASOUND ABDOMEN COMPLETE  COMPARISON:  CT 1 day prior at 2055 hour  FINDINGS: Gallbladder: Multiple gallstones with posterior shadowing creating a wall echo shadow complex. Normal wall thickness of 1.5 mm. Sonographic Percell Miller sign is negative.  Common bile duct: Diameter: 5 mm  Liver: No focal lesion identified. Within normal limits in parenchymal echogenicity. Portions of the liver obscured.  IVC: No abnormality visualized.  Pancreas: Not visualized due to bowel gas.  Spleen: Normal in size measuring 10.1 cm. There is a rounded 3.7 x 4.3 x 3.7 cm echogenic lesion in the mid spleen. No internal blood flow.  Right Kidney: Length: 9.5 cm. Echogenicity within normal limits. No mass or hydronephrosis visualized.  Left Kidney: Length: 10.6 cm. Echogenicity within normal limits. No mass or hydronephrosis visualized.  Abdominal aorta: No aneurysm visualized.  Limited visualization.  Other findings: No ascites.  IMPRESSION: 1. Multiple gallstones with posterior shadowing creating a wall echo shadow complex. No sonographic findings of acute cholecystitis. 2. Echogenic lesion in the spleen measuring 4.3 cm. This was not seen on CT. In the absence of known malignancy, this is likely benign and may reflect a splenic hemangioma. Follow-up ultrasound could be considered in 6 months to evaluate for change.   Electronically Signed   By: Jeb Levering M.D.   On: 11/22/2014 05:26   Dg Chest Port 1 View  11/21/2014   CLINICAL DATA:  Fever, abdominal pain and diarrhea.  EXAM: PORTABLE CHEST - 1 VIEW  COMPARISON:  03/15/2014  FINDINGS: Lung volumes are low. Heart size is normal, there is atherosclerosis of the aorta. No consolidation, pleural effusion, and pulmonary edema. Degenerative change in  the spine.  IMPRESSION: Hypoventilatory chest, no acute process.   Electronically Signed   By: Jeb Levering M.D.   On: 11/21/2014 23:50    Oren Binet, MD  [facility] Pager:[phone]  If 7PM-7AM, please contact night-coverage [email or web address] Password [email or web address]  11/23/2014, 8:06 AM   LOS: 2 days

## 2014-11-24 DIAGNOSIS — I509 Heart failure, unspecified: Secondary | ICD-10-CM

## 2014-11-24 LAB — BASIC METABOLIC PANEL
Anion gap: 4 — ABNORMAL LOW (ref 5–15)
BUN: 21 mg/dL (ref 6–23)
CALCIUM: 7.9 mg/dL — AB (ref 8.4–10.5)
CO2: 25 mmol/L (ref 19–32)
CREATININE: 1.76 mg/dL — AB (ref 0.50–1.10)
Chloride: 110 mmol/L (ref 96–112)
GFR calc non Af Amer: 26 mL/min — ABNORMAL LOW (ref >90)
GFR, EST AFRICAN AMERICAN: 30 mL/min — AB (ref >90)
GLUCOSE: 98 mg/dL (ref 70–99)
POTASSIUM: 3.8 mmol/L (ref 3.5–5.1)
Sodium: 139 mmol/L (ref 135–145)

## 2014-11-24 MED ORDER — SACCHAROMYCES BOULARDII 250 MG PO CAPS
250.0000 mg | ORAL_CAPSULE | Freq: Two times a day (BID) | ORAL | Status: DC
  Administered 2014-11-24: 250 mg via ORAL
  Filled 2014-11-24 (×2): qty 1

## 2014-11-24 MED ORDER — LOPERAMIDE HCL 2 MG PO CAPS: 2.0000 mg | ORAL_CAPSULE | ORAL | Status: DC | PRN

## 2014-11-24 MED ORDER — SACCHAROMYCES BOULARDII 250 MG PO CAPS: 250.0000 mg | ORAL_CAPSULE | Freq: Two times a day (BID) | ORAL | Status: DC

## 2014-11-24 MED ORDER — METOPROLOL TARTRATE 25 MG PO TABS: 25.0000 mg | ORAL_TABLET | Freq: Two times a day (BID) | ORAL | Status: DC

## 2014-11-24 NOTE — Progress Notes (Signed)
  Echocardiogram 2D Echocardiogram has been performed.  Lysle Rubens 11/24/2014, 9:40 AM

## 2014-11-24 NOTE — Progress Notes (Signed)
Discharge instructions and medications discussed with patient.  Prescriptions given to patient.  All questions answered.  

## 2014-11-24 NOTE — Discharge Summary (Addendum)
PATIENT DETAILS Name: [name] Age: 81 y.o. Sex: female Date of Birth: [birth year] MRN: [id number]. Admitting Physician: Rise Patience, MD [id number], MD  Admit Date: 11/21/2014 Discharge date: 11/24/2014  Recommendations for Outpatient Follow-up:  1. Please follow stool cultures till final 2. Would not rechallenge with Aldactone in the future-severe hyperkalemia 3. Has HOCM-see below. Be cautious with diuretics 4. Avoid NSAIDs for worsening CKD and hyperkalemia 5. Please consider referral to general surgery for cholelithiasis 6. Please consider referral to urology for ureteral stone  PRIMARY DISCHARGE DIAGNOSIS:  Active Problems:   Hyperkalemia   Diarrhea   Metabolic acidosis   Hypertension, uncontrolled   Renal failure (ARF), acute on chronic      PAST MEDICAL HISTORY: Past Medical History  Diagnosis Date  . Hypertension   . Hypercholesteremia   . Arthritis   . Heart murmur     long time ago--no problems  . Chronic kidney disease     had some renal insufficiency.  Sees Dr. Moshe Cipro who took her off benicar in 2013.               DISCHARGE MEDICATIONS: Current Discharge Medication List    START taking these medications   Details  metoprolol tartrate (LOPRESSOR) 25 MG tablet Take 1 tablet (25 mg total) by mouth 2 (two) times daily. Qty: 60 tablet, Refills: 0    saccharomyces boulardii (FLORASTOR) 250 MG capsule Take 1 capsule (250 mg total) by mouth 2 (two) times daily. Qty: 60 capsule, Refills: 0      CONTINUE these medications which have CHANGED   Details  loperamide (IMODIUM) 2 MG capsule Take 1 capsule (2 mg total) by mouth as needed for diarrhea or loose stools. Qty: 20 capsule, Refills: 0      CONTINUE these medications which have NOT CHANGED   Details  ALPRAZolam (XANAX) 0.5 MG tablet Take 0.5 mg by mouth 3 (three) times daily as needed. For anxiety    amLODipine (NORVASC) 10 MG tablet Take 10 mg by mouth daily.      aspirin EC 81 MG tablet Take 81 mg by mouth daily.    Cholecalciferol 50000 UNITS TABS Take 1 capsule by mouth every Monday.    cilostazol (PLETAL) 100 MG tablet Take 50 mg by mouth 2 (two) times daily.  Refills: 3    famotidine (PEPCID) 20 MG tablet Take 20 mg by mouth 2 (two) times daily.    furosemide (LASIX) 40 MG tablet Take 40 mg by mouth 2 (two) times daily.     HYDROcodone-acetaminophen (NORCO) 10-325 MG per tablet Take 1 tablet by mouth every 6 (six) hours as needed for severe pain.  Refills: 0    methocarbamol (ROBAXIN) 500 MG tablet Take 500 mg by mouth 2 (two) times daily as needed for muscle spasms.    Multiple Vitamin (MULTIVITAMIN WITH MINERALS) TABS Take 1 tablet by mouth daily.    Omega-3 Fatty Acids (FISH OIL PO) Take 1 capsule by mouth daily.    predniSONE (DELTASONE) 5 MG tablet Take 2.5 mg by mouth daily.       STOP taking these medications     etodolac (LODINE) 400 MG tablet      spironolactone (ALDACTONE) 25 MG tablet         ALLERGIES:   Allergies  Allergen Reactions  . Neosporin [Neomycin-Bacitracin Zn-Polymyx] Itching    Hot and burning, too    BRIEF HPI:  See H&P, Labs, Consult and Test reports for all details in  brief, patient is a 81 y.o. female with history of hypertension and chronic kidney disease and arthritis presents to the ER because of diarrhea.In the ER patient's labs revealed hyperkalemia with EKG showing tall T waves. Patient was then admitted for further evaluation and treatment  CONSULTATIONS:   nephrology  PERTINENT RADIOLOGIC STUDIES: Ct Abdomen Pelvis Wo Contrast  11/21/2014   CLINICAL DATA:  Lower abdominal pain with diarrhea  EXAM: CT ABDOMEN AND PELVIS WITHOUT CONTRAST  TECHNIQUE: Multidetector CT imaging of the abdomen and pelvis was performed following the standard protocol without IV contrast.  COMPARISON:  11/15/07  FINDINGS: Lung bases are free of acute infiltrate or sizable effusion. Some stable scarring is noted  in the medial right lung base.  The gallbladder is decompressed with multiple gallstones within. The liver, spleen, right adrenal gland and kidneys are within normal limits. No renal calculi are seen. A left adrenal lesion is again noted and stable measuring approximately 1 cm. This likely represents an adenoma. The collecting systems are well visualized without obstructive change. There is however a 5 mm stone identified at the right ureterovesical junction which was not seen on the prior exam. No significant obstructive changes noted.  The bladder is well distended. No pelvic mass lesion is seen. Degenerative changes of the lumbar spine are seen. The appendix is within normal limits. Multiple colonic diverticular noted without evidence of diverticulitis.  IMPRESSION: 5 mm distal right ureteral stone without significant obstructive change.  Multiple gallstones stable from the prior exam.   Electronically Signed   By: Inez Catalina M.D.   On: 11/21/2014 21:11   US Abdomen Complete  11/22/2014   CLINICAL DATA:  Nausea and vomiting.  EXAM: ULTRASOUND ABDOMEN COMPLETE  COMPARISON:  CT 1 day prior at 2055 hour  FINDINGS: Gallbladder: Multiple gallstones with posterior shadowing creating a wall echo shadow complex. Normal wall thickness of 1.5 mm. Sonographic Percell Miller sign is negative.  Common bile duct: Diameter: 5 mm  Liver: No focal lesion identified. Within normal limits in parenchymal echogenicity. Portions of the liver obscured.  IVC: No abnormality visualized.  Pancreas: Not visualized due to bowel gas.  Spleen: Normal in size measuring 10.1 cm. There is a rounded 3.7 x 4.3 x 3.7 cm echogenic lesion in the mid spleen. No internal blood flow.  Right Kidney: Length: 9.5 cm. Echogenicity within normal limits. No mass or hydronephrosis visualized.  Left Kidney: Length: 10.6 cm. Echogenicity within normal limits. No mass or hydronephrosis visualized.  Abdominal aorta: No aneurysm visualized.  Limited visualization.   Other findings: No ascites.  IMPRESSION: 1. Multiple gallstones with posterior shadowing creating a wall echo shadow complex. No sonographic findings of acute cholecystitis. 2. Echogenic lesion in the spleen measuring 4.3 cm. This was not seen on CT. In the absence of known malignancy, this is likely benign and may reflect a splenic hemangioma. Follow-up ultrasound could be considered in 6 months to evaluate for change.   Electronically Signed   By: Jeb Levering M.D.   On: 11/22/2014 05:26   Dg Chest Port 1 View  11/21/2014   CLINICAL DATA:  Fever, abdominal pain and diarrhea.  EXAM: PORTABLE CHEST - 1 VIEW  COMPARISON:  03/15/2014  FINDINGS: Lung volumes are low. Heart size is normal, there is atherosclerosis of the aorta. No consolidation, pleural effusion, and pulmonary edema. Degenerative change in the spine.  IMPRESSION: Hypoventilatory chest, no acute process.   Electronically Signed   By: Jeb Levering M.D.   On: 11/21/2014 23:50  PERTINENT LAB RESULTS: CBC:  Recent Labs  11/22/14 0550 11/23/14 0237  WBC 5.1 4.0  HGB 10.5* 10.1*  HCT 34.4* 33.8*  PLT 160 126*   CMET CMP     Component Value Date/Time   NA 139 11/24/2014 0420   K 3.8 11/24/2014 0420   CL 110 11/24/2014 0420   CO2 25 11/24/2014 0420   GLUCOSE 98 11/24/2014 0420   BUN 21 11/24/2014 0420   CREATININE 1.76* 11/24/2014 0420   CALCIUM 7.9* 11/24/2014 0420   PROT 7.4 11/21/2014 1711   ALBUMIN 3.7 11/21/2014 1711   AST 24 11/21/2014 1711   ALT 16 11/21/2014 1711   ALKPHOS 69 11/21/2014 1711   BILITOT 0.7 11/21/2014 1711   GFRNONAA 26* 11/24/2014 0420   GFRAA 30* 11/24/2014 0420    GFR Estimated Creatinine Clearance: 29.3 mL/min (by C-G formula based on Cr of 1.76).  Recent Labs  11/21/14 1711  LIPASE 33   No results for input(s): CKTOTAL, CKMB, CKMBINDEX, TROPONINI in the last 72 hours. Invalid input(s): POCBNP No results for input(s): DDIMER in the last 72 hours. No results for input(s):  HGBA1C in the last 72 hours. No results for input(s): CHOL, HDL, LDLCALC, TRIG, CHOLHDL, LDLDIRECT in the last 72 hours. No results for input(s): TSH, T4TOTAL, T3FREE, THYROIDAB in the last 72 hours.  Invalid input(s): FREET3 No results for input(s): VITAMINB12, FOLATE, FERRITIN, TIBC, IRON, RETICCTPCT in the last 72 hours. Coags: No results for input(s): INR in the last 72 hours.  Invalid input(s): PT Microbiology: Recent Results (from the past 240 hour(s))  Urine culture     Status: None   Collection Time: 11/21/14 10:30 PM  Result Value Ref Range Status   Specimen Description URINE, RANDOM  Final   Special Requests NONE  Final   Colony Count   Final    4,000 COLONIES/ML Performed at [facility]    Culture   Final    INSIGNIFICANT GROWTH Performed at [facility]    Report Status 11/23/2014 FINAL  Final  Stool culture     Status: None (Preliminary result)   Collection Time: 11/22/14  6:12 AM  Result Value Ref Range Status   Specimen Description STOOL  Final   Special Requests Normal  Final   Culture   Final    NO SUSPICIOUS COLONIES, CONTINUING TO HOLD Performed at [facility]    Report Status PENDING  Incomplete  Clostridium Difficile by PCR     Status: None   Collection Time: 11/22/14  6:12 AM  Result Value Ref Range Status   C difficile by pcr NEGATIVE NEGATIVE Final  MRSA PCR Screening     Status: None   Collection Time: 11/22/14  6:41 AM  Result Value Ref Range Status   MRSA by PCR NEGATIVE NEGATIVE Final    Comment:        The GeneXpert MRSA Assay (FDA approved for NASAL specimens only), is one component of a comprehensive MRSA colonization surveillance program. It is not intended to diagnose MRSA infection nor to guide or monitor treatment for MRSA infections.      BRIEF HOSPITAL COURSE:  Diarrhea: ?etiology-suspect viral gastroenteritis, C Diff PCR negative. Empirically started on Cipro, Flagyl, since no fever, C Diff PCR  negative, since etiology felt to be viral, all antibiotics was discontinued on 3/26 and the patient was monitored for another 24 hours. She continues to do well. Stool cultures at the time of discharge are negative (preliminary-called lab) . We  will provide her some probiotics and as needed Imodium. Patient being discharged home in a stable manner.   Acute on CKD stage 3: ARF secondary to pre-renal azotemia from above along with diuretic/NSAID use. She was treated with IV fluids, creatinine is back to her usual baseline at time of discharge. She was asked to make an appointment with her primary nephrologist in the next few weeks.    Hyperkalemia: secondary to above and Aldactone/NSAID use. This recurred multiple times during this hospital stay, she was treated with multiple doses of Kayexalate, multiple doses of IV insulin and IV fluids with bicarbonate. This finally resolved in 3/26. Labs from this morning, show normal potassium. She was asked to avoid NSAIDs and Aldactone in the future.   Metabolic Acidosis:Resolved. Secondary to diarrhea and ARF. Treated with IV Bicarb.   [id number], continue Amlodipine. Aldactone discontinued due to hyperkalemia. Further optimization deferred to the outpatient setting   Arthritis: on chronic prednisone, was on double dose to 5 mg while acute ill. She will be transitioned back to 2.5 mg on discharge   Cholelithiasis: belly soft and non tender. No clinical features suggestive of acute cholecystitis. Please refer to general surgery as an outpatient   5 mm Distal Right Ureteral Stone:no hydronephroses on CT scan abdomen and ultrasound abdomen, no flank pain. Suspect renal failure secondary to prerenal azotemia with diuretic use rather than distal right ureteral stone. Creatinine on back to usual baseline. Please consider referral to urology as outpatient. She is without abdominal pain or flank pain, appears that this is asymptomatic and should pass  spontaneously.  Addendum: Suspected HOCM: 2-D echocardiogram findings discussed with Dr. Debara Pickett cardiologist on-call reviewed the chart, suggested adding beta blockers him a since no significant outflow obstruction, okay to continue Lasix at this time.   TODAY-DAY OF DISCHARGE:  Subjective:   Kathyrn Lonsinger today has no headache,no chest abdominal pain,no new weakness tingling or numbness, feels much better wants to go home today.  Objective:   Blood pressure 159/38, pulse 65, temperature 98.7 F (37.1 C), temperature source Oral, resp. rate 18, height 5' 5.35" (1.66 m), weight 104.72 kg (230 lb 13.9 oz), SpO2 97 %.  Intake/Output Summary (Last 24 hours) at 11/24/14 1353 Last data filed at 11/24/14 1010  Gross per 24 hour  Intake   1920 ml  Output    351 ml  Net   1569 ml   Filed Weights   11/22/14 0737 11/23/14 0349 11/23/14 2130  Weight: 105 kg (231 lb 7.7 oz) 103.874 kg (229 lb) 104.72 kg (230 lb 13.9 oz)    Exam Awake Alert, Oriented *3, No new F.N deficits, Normal affect [place].AT,PERRAL Supple Neck,No JVD, No cervical lymphadenopathy appriciated.  Symmetrical Chest wall movement, Good air movement bilaterally, CTAB RRR,No Gallops,Rubs or new Murmurs, No Parasternal Heave +ve B.Sounds, Abd Soft, Non tender, No organomegaly appriciated, No rebound -guarding or rigidity. No Cyanosis, Clubbing or edema, No new Rash or bruise  DISCHARGE CONDITION: Stable  DISPOSITION: Home  DISCHARGE INSTRUCTIONS:    Activity:  As tolerated   Diet recommendation: Heart Healthy diet  Discharge Instructions    Call MD for:  persistant nausea and vomiting    Complete by:  As directed      Call MD for:    Complete by:  As directed   If significant diarrhea recurrs     Diet - low sodium heart healthy    Complete by:  As directed      Increase activity slowly  Complete by:  As directed            Follow-up Information    Follow up with Robyn Haber, MD. Schedule an  appointment as soon as possible for a visit in 1 week.   Specialty:  Family Medicine   Contact information:   [facility] [id number] [facility]. [address] [place] [phone]       Follow up with GOLDSBOROUGH,KELLIE A, MD. Schedule an appointment as soon as possible for a visit in 3 weeks.   Specialty:  Nephrology   Contact information:   [address] [id number] [phone]        Total Time spent on discharge equals 45 minutes.  SignedOren Binet 11/24/2014 1:53 PM

## 2014-11-24 NOTE — Evaluation (Signed)
Physical Therapy Evaluation Patient Details Name: [name] MRN: [id number] DOB: 06-22-1929 Today's Date: 11/24/2014   History of Present Illness  Patient is an 81 yo female admitted 11/21/14 with diarrhea and hyperkalemia.  PMH:  HTN, CKD, arthritis, Bil knee surgery  Clinical Impression  Patient doing well with mobility and gait.  Reports she is at her baseline level for functional mobility.  No acute PT needs identified - PT will sign off.    Follow Up Recommendations No PT follow up;Supervision - Intermittent    Equipment Recommendations  None recommended by PT    Recommendations for Other Services       Precautions / Restrictions Precautions Precautions: Fall Required Braces or Orthoses: Other Brace/Splint Other Brace/Splint: Patient wears braces on both knees "for support" Restrictions Weight Bearing Restrictions: No      Mobility  Bed Mobility Overal bed mobility: Modified Independent             General bed mobility comments: Increased time  Transfers Overall transfer level: Modified independent Equipment used: Rolling walker (2 wheeled)             General transfer comment: Uses correct hand placement.  Able to stand from bed and toilet without physical assist.  Ambulation/Gait Ambulation/Gait assistance: Supervision Ambulation Distance (Feet): 72 Feet Assistive device: Rolling walker (2 wheeled) Gait Pattern/deviations: Step-through pattern;Decreased stride length Gait velocity: Decreased Gait velocity interpretation: Below normal speed for age/gender General Gait Details: Patient with slow, guarded gait pattern.  Demonstrates safe use of RW.   Stairs            Wheelchair Mobility    Modified Rankin (Stroke Patients Only)       Balance                                             Pertinent Vitals/Pain Pain Assessment: 0-10 Pain Score: 3  Pain Location: Bil knees Pain Descriptors / Indicators:  Aching Pain Intervention(s): Limited activity within patient's tolerance;Repositioned    Home Living Family/patient expects to be discharged to:: Private residence Living Arrangements: Other relatives (granddaughter) Available Help at Discharge: Family;Available PRN/intermittently (granddaughter works) Type of Home: House Home Access: Level entry     Home Layout: One [place]: Environmental consultant - 4 wheels;Cane - single point;Bedside commode;Shower seat      Prior Function Level of Independence: Independent with assistive device(s)         Comments: Uses cane inside and rollator outside of house.     Hand Dominance        Extremity/Trunk Assessment   Upper Extremity Assessment: Overall WFL for tasks assessed           Lower Extremity Assessment: Generalized weakness      Cervical / Trunk Assessment: Normal  Communication   Communication: No difficulties  Cognition Arousal/Alertness: Awake/alert Behavior During Therapy: WFL for tasks assessed/performed Overall Cognitive Status: Within Functional Limits for tasks assessed                      General Comments      Exercises        Assessment/Plan    PT Assessment Patent does not need any further PT services  PT Diagnosis Abnormality of gait;Generalized weakness;Acute pain   PT Problem List    PT Treatment Interventions     PT Goals (Current  goals can be found in the Care Plan section) Acute Rehab PT Goals PT Goal Formulation: All assessment and education complete, DC therapy    Frequency     Barriers to discharge        Co-evaluation               End of Session Equipment Utilized During Treatment: Gait belt Activity Tolerance: Patient tolerated treatment well Patient left: in bed;with call bell/phone within reach;with family/visitor present (sitting EOB) Nurse Communication: Mobility status (Ready for d/c from PT perspective)         Time: [id number] PT Time Calculation  (min) (ACUTE ONLY): 18 min   Charges:   PT Evaluation $Initial PT Evaluation Tier I: 1 Procedure     PT G CodesDespina Pole 12/04/2014, 1:59 PM Carita Pian. Sanjuana Kava, [place] Pager [phone]

## 2014-11-26 LAB — STOOL CULTURE: Special Requests: NORMAL

## 2014-12-06 ENCOUNTER — Encounter (HOSPITAL_COMMUNITY): Payer: Self-pay | Admitting: *Deleted

## 2014-12-06 ENCOUNTER — Emergency Department (HOSPITAL_COMMUNITY)
Admission: EM | Admit: 2014-12-06 | Discharge: 2014-12-06 | Disposition: A | Discharge status: Home / Self Care | Payer: Medicare HMO | Attending: Emergency Medicine | Admitting: Emergency Medicine

## 2014-12-06 DIAGNOSIS — Z7982 Long term (current) use of aspirin: Secondary | ICD-10-CM | POA: Insufficient documentation

## 2014-12-06 DIAGNOSIS — N179 Acute kidney failure, unspecified: Secondary | ICD-10-CM | POA: Insufficient documentation

## 2014-12-06 DIAGNOSIS — I129 Hypertensive chronic kidney disease with stage 1 through stage 4 chronic kidney disease, or unspecified chronic kidney disease: Secondary | ICD-10-CM | POA: Diagnosis not present

## 2014-12-06 DIAGNOSIS — Z79899 Other long term (current) drug therapy: Secondary | ICD-10-CM | POA: Insufficient documentation

## 2014-12-06 DIAGNOSIS — Z7952 Long term (current) use of systemic steroids: Secondary | ICD-10-CM | POA: Diagnosis not present

## 2014-12-06 DIAGNOSIS — N189 Chronic kidney disease, unspecified: Secondary | ICD-10-CM | POA: Diagnosis not present

## 2014-12-06 DIAGNOSIS — I82402 Acute embolism and thrombosis of unspecified deep veins of left lower extremity: Secondary | ICD-10-CM | POA: Insufficient documentation

## 2014-12-06 DIAGNOSIS — R2242 Localized swelling, mass and lump, left lower limb: Secondary | ICD-10-CM | POA: Diagnosis present

## 2014-12-06 DIAGNOSIS — R011 Cardiac murmur, unspecified: Secondary | ICD-10-CM | POA: Diagnosis not present

## 2014-12-06 DIAGNOSIS — M1990 Unspecified osteoarthritis, unspecified site: Secondary | ICD-10-CM | POA: Insufficient documentation

## 2014-12-06 DIAGNOSIS — E780 Pure hypercholesterolemia: Secondary | ICD-10-CM | POA: Diagnosis not present

## 2014-12-06 DIAGNOSIS — I10 Essential (primary) hypertension: Secondary | ICD-10-CM

## 2014-12-06 LAB — BASIC METABOLIC PANEL
Anion gap: 13 (ref 5–15)
BUN: 49 mg/dL — ABNORMAL HIGH (ref 6–23)
CO2: 21 mmol/L (ref 19–32)
CREATININE: 2.72 mg/dL — AB (ref 0.50–1.10)
Calcium: 9.6 mg/dL (ref 8.4–10.5)
Chloride: 108 mmol/L (ref 96–112)
GFR calc Af Amer: 18 mL/min — ABNORMAL LOW (ref >90)
GFR calc non Af Amer: 15 mL/min — ABNORMAL LOW (ref >90)
GLUCOSE: 103 mg/dL — AB (ref 70–99)
POTASSIUM: 5.4 mmol/L — AB (ref 3.5–5.1)
Sodium: 142 mmol/L (ref 135–145)

## 2014-12-06 LAB — CBC
HCT: 32.7 % — ABNORMAL LOW (ref 36.0–46.0)
HEMOGLOBIN: 10.3 g/dL — AB (ref 12.0–15.0)
MCH: 26.3 pg (ref 26.0–34.0)
MCHC: 31.5 g/dL (ref 30.0–36.0)
MCV: 83.4 fL (ref 78.0–100.0)
Platelets: 212 10*3/uL (ref 150–400)
RBC: 3.92 MIL/uL (ref 3.87–5.11)
RDW: 14.7 % (ref 11.5–15.5)
WBC: 5.9 10*3/uL (ref 4.0–10.5)

## 2014-12-06 MED ORDER — APIXABAN 5 MG PO TABS
10.0000 mg | ORAL_TABLET | Freq: Two times a day (BID) | ORAL | Status: DC
  Administered 2014-12-06: 10 mg via ORAL
  Filled 2014-12-06: qty 2

## 2014-12-06 MED ORDER — APIXABAN 5 MG PO TABS: ORAL_TABLET | ORAL | Status: DC

## 2014-12-06 NOTE — ED Provider Notes (Addendum)
CSN: [id number]     Arrival date & time 12/06/14  1630 History   First MD Initiated Contact with Patient 12/06/14 1730     Chief Complaint  Patient presents with  . Leg Swelling  . DVT     (Consider location/radiation/quality/duration/timing/severity/associated sxs/prior Treatment) HPI Comments: 81 year old female with history of acute kidney injury, high blood pressure, recent hospitalization presents with left leg swelling. Patient had ultrasound done at [facility] and was told she had a new blood clot in her left leg vein.  No history of blood clot. Leg swelling worse since hospitalization recently. No current shortness of breath or chest pain, no blood in the stools or active bleeding. Patient feels well currently otherwise. Patient's plan going on for complications tomorrow.  The history is provided by the patient.    Past Medical History  Diagnosis Date  . Hypertension   . Hypercholesteremia   . Arthritis   . Heart murmur     long time ago--no problems  . Chronic kidney disease     had some renal insufficiency.  Sees Dr. Moshe Cipro who took her off benicar in 2013.              Past Surgical History  Procedure Laterality Date  . Joint replacement      both knees  . Failed knee surgery      on the right x 2  . Cataract extraction w/phaco Right 03/20/2014    Procedure: CATARACT EXTRACTION PHACO AND INTRAOCULAR LENS PLACEMENT (IOC);  Surgeon: [name], MD;  Location: [facility];  Service: Ophthalmology;  Laterality: Right;   Family History  Problem Relation Age of Onset  . Diabetes Mellitus II Daughter   . Hypertension Daughter    History  Substance Use Topics  . Smoking status: Never Smoker   . Smokeless tobacco: Not on file  . Alcohol Use: Yes     Comment: occasional   OB History    No data available     Review of Systems  Constitutional: Negative for fever and chills.  HENT: Negative for congestion.   Eyes: Negative for visual disturbance.  Respiratory:  Negative for shortness of breath.   Cardiovascular: Positive for leg swelling. Negative for chest pain.  Gastrointestinal: Negative for vomiting and abdominal pain.  Genitourinary: Negative for dysuria and flank pain.  Musculoskeletal: Negative for back pain, neck pain and neck stiffness.  Skin: Negative for rash.  Neurological: Negative for light-headedness and headaches.      Allergies  Neosporin  Home Medications   Prior to Admission medications   Medication Sig Start Date End Date Taking? Authorizing Provider  ALPRAZolam Duanne Moron) 0.5 MG tablet Take 0.5 mg by mouth 3 (three) times daily as needed. For anxiety   Yes Historical Provider, MD  amLODipine (NORVASC) 10 MG tablet Take 10 mg by mouth daily.   Yes Historical Provider, MD  aspirin EC 81 MG tablet Take 81 mg by mouth daily.   Yes Historical Provider, MD  Cholecalciferol 50000 UNITS TABS Take 1 capsule by mouth every Monday.   Yes Historical Provider, MD  cilostazol (PLETAL) 100 MG tablet Take 50 mg by mouth 2 (two) times daily.  09/03/14  Yes Historical Provider, MD  famotidine (PEPCID) 20 MG tablet Take 20 mg by mouth 2 (two) times daily.   Yes Historical Provider, MD  furosemide (LASIX) 40 MG tablet Take 80 mg by mouth daily.    Yes Historical Provider, MD  HYDROcodone-acetaminophen (NORCO) 10-325 MG per tablet Take  1 tablet by mouth every 6 (six) hours as needed for severe pain.  10/02/14  Yes Historical Provider, MD  loperamide (IMODIUM) 2 MG capsule Take 1 capsule (2 mg total) by mouth as needed for diarrhea or loose stools. 11/24/14  Yes Shanker Kristeen Mans, MD  methocarbamol (ROBAXIN) 500 MG tablet Take 500 mg by mouth 2 (two) times daily as needed for muscle spasms.   Yes Historical Provider, MD  metoprolol tartrate (LOPRESSOR) 25 MG tablet Take 1 tablet (25 mg total) by mouth 2 (two) times daily. 11/24/14  Yes Shanker Kristeen Mans, MD  Multiple Vitamin (MULTIVITAMIN WITH MINERALS) TABS Take 1 tablet by mouth daily.   Yes Historical  Provider, MD  Omega-3 Fatty Acids (FISH OIL PO) Take 1 capsule by mouth daily.   Yes Historical Provider, MD  predniSONE (DELTASONE) 5 MG tablet Take 2.5 mg by mouth daily.    Yes Historical Provider, MD  Probiotic Product (PROBIOTIC PO) Take 1 tablet by mouth daily.   Yes Historical Provider, MD  saccharomyces boulardii (FLORASTOR) 250 MG capsule Take 1 capsule (250 mg total) by mouth 2 (two) times daily. 11/24/14  Yes Shanker Kristeen Mans, MD  apixaban (ELIQUIS) 5 MG TABS tablet Take 10 mg twice daily for 7 days then 5 mg daily until you see your physician. 12/06/14   Elnora Morrison, MD   BP 184/62 mmHg  Pulse 67  Temp(Src) 98.7 F (37.1 C) (Oral)  Resp 20  Ht 5' 5.5" (1.664 m)  Wt 231 lb (104.781 kg)  BMI 37.84 kg/m2  SpO2 99% Physical Exam  Constitutional: She is oriented to person, place, and time. She appears well-developed and well-nourished. No distress.  HENT:  Head: Normocephalic and atraumatic.  Eyes: Right eye exhibits no discharge. Left eye exhibits no discharge.  Neck: Normal range of motion. Neck supple. No tracheal deviation present.  Cardiovascular: Normal rate and regular rhythm.   Pulmonary/Chest: Effort normal and breath sounds normal.  Abdominal: Soft. She exhibits no distension. There is no tenderness. There is no guarding.  Musculoskeletal: She exhibits edema (mild left leg swelling, no rash, nv intact distal).  Neurological: She is alert and oriented to person, place, and time.  Skin: Skin is warm. No rash noted.  Psychiatric: She has a normal mood and affect.  Nursing note and vitals reviewed.   ED Course  Procedures (including critical care time) Labs Review Labs Reviewed  BASIC METABOLIC PANEL - Abnormal; Notable for the following:    Potassium 5.4 (*)    Glucose, Bld 103 (*)    BUN 49 (*)    Creatinine, Ser 2.72 (*)    GFR calc non Af Amer 15 (*)    GFR calc Af Amer 18 (*)    All other components within normal limits  CBC - Abnormal; Notable for the  following:    Hemoglobin 10.3 (*)    HCT 32.7 (*)    All other components within normal limits    Imaging Review No results found.   EKG Interpretation None      MDM   Final diagnoses:  Essential hypertension  DVT (deep venous thrombosis), left  ARF  Patient presents with isolated concern left leg swelling, likely due to recent hospitalization. Attempt to obtain official results from ultrasound performed unsuccessful likely due to Friday night, nurse attempted. Clarified with patient, definitely told there was a blood clot in her left leg, with leg swelling in the left leg and recent hospital ablation clinical also concern. Difficulty with  blood, delayed. Discussed with pharmacy for recommendations for oral anticoagulant with kidney function, they recommended Eliquis without dose adjustment.  Discussed close follow-up with primary doctor, discussed risks and benefits of flying with blood clot, patient looking for to this trip and still wants to go, patient will be with family. Discussed strict reasons to seek medical care.  Patient family comfortable this plan. Patient will follow-up for elevated blood pressure with her primary Dr. next week.  Results and differential diagnosis were discussed with the patient/parent/guardian. Close follow up outpatient was discussed, comfortable with the plan.   Medications  apixaban (ELIQUIS) tablet 10 mg (not administered)    Filed Vitals:   12/06/14 2208 12/06/14 2215 12/06/14 2230 12/06/14 2240  BP: 215/62   184/62  Pulse: 68 71 67   Temp:      TempSrc:      Resp: 14 19 20    Height:      Weight:      SpO2: 98% 98% 99%     Final diagnoses:  Essential hypertension  DVT (deep venous thrombosis), left        Elnora Morrison, MD 12/06/14 [id number]  Elnora Morrison, MD 12/16/14 [phone]

## 2014-12-06 NOTE — ED Notes (Signed)
Pt reports recently admitted to hospital and having left leg swelling since shortly after being discharged. Pt went for doppler study and was told + blood clot.

## 2014-12-06 NOTE — Discharge Instructions (Signed)
Have blood pressure rechecked by primary doctor. Take blood thinner medicines as discussed. If you develop chest pain or shortness of breath go to the emergency department or see a physician. If you develop bleeding or hit your head hold your new medication and see a physician or go to the emergency department. If you were given medicines take as directed.  If you are on coumadin or contraceptives realize their levels and effectiveness is altered by many different medicines.  If you have any reaction (rash, tongues swelling, other) to the medicines stop taking and see a physician.   Please follow up as directed and return to the ER or see a physician for new or worsening symptoms.  Thank you.  Information on my medicine - ELIQUIS (apixaban)   Why was Eliquis prescribed for you? Eliquis was prescribed to treat blood clots that may have been found in the veins of your legs (deep vein thrombosis) or in your lungs (pulmonary embolism) and to reduce the risk of them occurring again.  What do You need to know about Eliquis ? The starting dose is 10 mg (two 5 mg tablets) taken TWICE daily for the FIRST SEVEN (7) DAYS, then on the evening of 12/13/14 (with the evening dose)  the dose is reduced to ONE 5 mg tablet taken TWICE daily.  Eliquis may be taken with or without food.   Try to take the dose about the same time in the morning and in the evening. If you have difficulty swallowing the tablet whole please discuss with your pharmacist how to take the medication safely.  Take Eliquis exactly as prescribed and DO NOT stop taking Eliquis without talking to the doctor who prescribed the medication.  Stopping may increase your risk of developing a new blood clot.  Refill your prescription before you run out.  After discharge, you should have regular check-up appointments with your healthcare provider that is prescribing your Eliquis.    What do you do if you miss a dose? If a dose of ELIQUIS is  not taken at the scheduled time, take it as soon as possible on the same day and twice-daily administration should be resumed. The dose should not be doubled to make up for a missed dose.  Important Safety Information A possible side effect of Eliquis is bleeding. You should call your healthcare provider right away if you experience any of the following: ? Bleeding from an injury or your nose that does not stop. ? Unusual colored urine (red or dark brown) or unusual colored stools (red or black). ? Unusual bruising for unknown reasons. ? A serious fall or if you hit your head (even if there is no bleeding).  Some medicines may interact with Eliquis and might increase your risk of bleeding or clotting while on Eliquis. To help avoid this, consult your healthcare provider or pharmacist prior to using any new prescription or non-prescription medications, including herbals, vitamins, non-steroidal anti-inflammatory drugs (NSAIDs) and supplements.  This website has more information on Eliquis (apixaban): http://www.eliquis.com/eliquis/home

## 2015-02-24 ENCOUNTER — Other Ambulatory Visit: Payer: Self-pay

## 2015-07-23 ENCOUNTER — Emergency Department (HOSPITAL_COMMUNITY)
Admission: EM | Admit: 2015-07-23 | Discharge: 2015-07-23 | Disposition: A | Discharge status: Home / Self Care | Payer: Medicare HMO | Attending: Emergency Medicine | Admitting: Emergency Medicine

## 2015-07-23 ENCOUNTER — Emergency Department (HOSPITAL_COMMUNITY): Payer: Medicare HMO

## 2015-07-23 ENCOUNTER — Encounter (HOSPITAL_COMMUNITY): Payer: Self-pay | Admitting: Emergency Medicine

## 2015-07-23 DIAGNOSIS — N189 Chronic kidney disease, unspecified: Secondary | ICD-10-CM | POA: Diagnosis not present

## 2015-07-23 DIAGNOSIS — R600 Localized edema: Secondary | ICD-10-CM | POA: Diagnosis not present

## 2015-07-23 DIAGNOSIS — R011 Cardiac murmur, unspecified: Secondary | ICD-10-CM | POA: Diagnosis not present

## 2015-07-23 DIAGNOSIS — M1990 Unspecified osteoarthritis, unspecified site: Secondary | ICD-10-CM | POA: Insufficient documentation

## 2015-07-23 DIAGNOSIS — Z7982 Long term (current) use of aspirin: Secondary | ICD-10-CM | POA: Insufficient documentation

## 2015-07-23 DIAGNOSIS — Z79899 Other long term (current) drug therapy: Secondary | ICD-10-CM | POA: Diagnosis not present

## 2015-07-23 DIAGNOSIS — R609 Edema, unspecified: Secondary | ICD-10-CM

## 2015-07-23 DIAGNOSIS — R079 Chest pain, unspecified: Secondary | ICD-10-CM | POA: Diagnosis not present

## 2015-07-23 DIAGNOSIS — E782 Mixed hyperlipidemia: Secondary | ICD-10-CM | POA: Insufficient documentation

## 2015-07-23 DIAGNOSIS — R2243 Localized swelling, mass and lump, lower limb, bilateral: Secondary | ICD-10-CM | POA: Diagnosis present

## 2015-07-23 DIAGNOSIS — I129 Hypertensive chronic kidney disease with stage 1 through stage 4 chronic kidney disease, or unspecified chronic kidney disease: Secondary | ICD-10-CM | POA: Insufficient documentation

## 2015-07-23 LAB — CBC
HCT: 39.8 % (ref 36.0–46.0)
Hemoglobin: 12.5 g/dL (ref 12.0–15.0)
MCH: 25.6 pg — AB (ref 26.0–34.0)
MCHC: 31.4 g/dL (ref 30.0–36.0)
MCV: 81.6 fL (ref 78.0–100.0)
PLATELETS: 193 10*3/uL (ref 150–400)
RBC: 4.88 MIL/uL (ref 3.87–5.11)
RDW: 15 % (ref 11.5–15.5)
WBC: 11.1 10*3/uL — ABNORMAL HIGH (ref 4.0–10.5)

## 2015-07-23 LAB — BASIC METABOLIC PANEL
Anion gap: 13 (ref 5–15)
BUN: 42 mg/dL — ABNORMAL HIGH (ref 6–20)
CALCIUM: 9.1 mg/dL (ref 8.9–10.3)
CO2: 25 mmol/L (ref 22–32)
CREATININE: 2.05 mg/dL — AB (ref 0.44–1.00)
Chloride: 101 mmol/L (ref 101–111)
GFR calc Af Amer: 24 mL/min — ABNORMAL LOW (ref >60)
GFR, EST NON AFRICAN AMERICAN: 21 mL/min — AB (ref >60)
GLUCOSE: 116 mg/dL — AB (ref 65–99)
Potassium: 4 mmol/L (ref 3.5–5.1)
Sodium: 139 mmol/L (ref 135–145)

## 2015-07-23 LAB — HEPATIC FUNCTION PANEL
ALBUMIN: 3.4 g/dL — AB (ref 3.5–5.0)
ALK PHOS: 67 U/L (ref 38–126)
ALT: 15 U/L (ref 14–54)
AST: 20 U/L (ref 15–41)
Bilirubin, Direct: <0.1 mg/dL — ABNORMAL LOW (ref 0.1–0.5)
TOTAL PROTEIN: 7 g/dL (ref 6.5–8.1)
Total Bilirubin: 0.3 mg/dL (ref 0.3–1.2)

## 2015-07-23 LAB — URINALYSIS, ROUTINE W REFLEX MICROSCOPIC
Bilirubin Urine: NEGATIVE
Glucose, UA: NEGATIVE mg/dL
Ketones, ur: NEGATIVE mg/dL
Leukocytes, UA: NEGATIVE
NITRITE: NEGATIVE
PROTEIN: NEGATIVE mg/dL
SPECIFIC GRAVITY, URINE: 1.014 (ref 1.005–1.030)
pH: 5.5 (ref 5.0–8.0)

## 2015-07-23 LAB — BRAIN NATRIURETIC PEPTIDE: B NATRIURETIC PEPTIDE 5: 54.7 pg/mL (ref 0.0–100.0)

## 2015-07-23 LAB — URINE MICROSCOPIC-ADD ON: WBC UA: NONE SEEN WBC/hpf (ref 0–5)

## 2015-07-23 LAB — TROPONIN I

## 2015-07-23 MED ORDER — OXYCODONE-ACETAMINOPHEN 5-325 MG PO TABS
1.0000 | ORAL_TABLET | Freq: Once | ORAL | Status: AC
  Administered 2015-07-23: 1 via ORAL
  Filled 2015-07-23: qty 1

## 2015-07-23 MED ORDER — MORPHINE SULFATE (PF) 2 MG/ML IV SOLN
2.0000 mg | Freq: Once | INTRAVENOUS | Status: AC
Start: 2015-07-23 — End: 2015-07-23
  Administered 2015-07-23: 2 mg via INTRAVENOUS
  Filled 2015-07-23: qty 1

## 2015-07-23 MED ORDER — FUROSEMIDE 10 MG/ML IJ SOLN
80.0000 mg | Freq: Once | INTRAMUSCULAR | Status: AC
  Administered 2015-07-23: 80 mg via INTRAVENOUS
  Filled 2015-07-23: qty 8

## 2015-07-23 MED ORDER — ONDANSETRON HCL 4 MG/2ML IJ SOLN
4.0000 mg | Freq: Once | INTRAMUSCULAR | Status: AC
  Administered 2015-07-23: 4 mg via INTRAVENOUS
  Filled 2015-07-23: qty 2

## 2015-07-23 MED ORDER — MEDICAL COMPRESSION STOCKINGS MISC: Status: DC

## 2015-07-23 NOTE — ED Notes (Signed)
Patient c/o central chest pain x 2 days that comes and goes, no radiation. Also c/o leg swelling 4+ bilateral lower legs and pain in both feet. Patient states the swelling in legs have occurred over the past 2 years, also comes and goes, has been seen and tried different medications to pull extra fluid off.  Respirations e/u. A&O x 4.

## 2015-07-23 NOTE — ED Notes (Signed)
Clarified with Dr. Betsey Holiday, patient is to receive lasix for swelling in feet, reviewed labs. Monitoring for diuresis after lasix administration.

## 2015-07-23 NOTE — ED Provider Notes (Signed)
CSN: [id number]     Arrival date & time 07/23/15  0215 History  By signing my name below, I, Emmanuella Mensah, attest that this documentation has been prepared under the direction and in the presence of Orpah Greek, MD. Electronically Signed: Judithann Sauger, ED Scribe. 07/23/2015. 2:52 AM.      Chief Complaint  Patient presents with  . Chest Pain  . Leg Swelling   The history is provided by the patient. No language interpreter was used.   HPI Comments: [name] is a 81 y.o. female with a hx of HTN, hypercholesteremia, and chronic kidney disease who presents to the Emergency Department complaining of gradually worsening constant lower leg swelling onset several days ago. She reports associated pain in her bilateral feet, mostly concentrated in her toes and intermittent CP. She denies any SOB or urinary symptoms. Pt was placed on Eliquis in April for a DVT behind her left knee. Pt takes a fluid pill twice a day and denies a hx of DM.    Past Medical History  Diagnosis Date  . Hypertension   . Hypercholesteremia   . Arthritis   . Heart murmur     long time ago--no problems  . Chronic kidney disease     had some renal insufficiency.  Sees Dr. Moshe Cipro who took her off benicar in 2013.              Past Surgical History  Procedure Laterality Date  . Joint replacement      both knees  . Failed knee surgery      on the right x 2  . Cataract extraction w/phaco Right 03/20/2014    Procedure: CATARACT EXTRACTION PHACO AND INTRAOCULAR LENS PLACEMENT (IOC);  Surgeon: [name], MD;  Location: [facility];  Service: Ophthalmology;  Laterality: Right;   Family History  Problem Relation Age of Onset  . Diabetes Mellitus II Daughter   . Hypertension Daughter    Social History  Substance Use Topics  . Smoking status: Never Smoker   . Smokeless tobacco: None  . Alcohol Use: Yes     Comment: occasional   OB History    No data available     Review of Systems   Constitutional: Negative for fever.  Respiratory: Negative for shortness of breath.   Genitourinary: Negative for dysuria and hematuria.  Musculoskeletal: Positive for joint swelling.  All other systems reviewed and are negative.     Allergies  Neosporin  Home Medications   Prior to Admission medications   Medication Sig Start Date End Date Taking? Authorizing Provider  ALPRAZolam Duanne Moron) 0.5 MG tablet Take 0.5 mg by mouth 3 (three) times daily as needed. For anxiety    Historical Provider, MD  amLODipine (NORVASC) 10 MG tablet Take 10 mg by mouth daily.    Historical Provider, MD  apixaban (ELIQUIS) 5 MG TABS tablet Take 10 mg twice daily for 7 days then 5 mg daily until you see your physician. 12/06/14   Elnora Morrison, MD  aspirin EC 81 MG tablet Take 81 mg by mouth daily.    Historical Provider, MD  Cholecalciferol 50000 UNITS TABS Take 1 capsule by mouth every Monday.    Historical Provider, MD  cilostazol (PLETAL) 100 MG tablet Take 50 mg by mouth 2 (two) times daily.  09/03/14   Historical Provider, MD  famotidine (PEPCID) 20 MG tablet Take 20 mg by mouth 2 (two) times daily.    Historical Provider, MD  furosemide (LASIX) 40  MG tablet Take 80 mg by mouth daily.     Historical Provider, MD  HYDROcodone-acetaminophen (NORCO) 10-325 MG per tablet Take 1 tablet by mouth every 6 (six) hours as needed for severe pain.  10/02/14   Historical Provider, MD  loperamide (IMODIUM) 2 MG capsule Take 1 capsule (2 mg total) by mouth as needed for diarrhea or loose stools. 11/24/14   Shanker Kristeen Mans, MD  methocarbamol (ROBAXIN) 500 MG tablet Take 500 mg by mouth 2 (two) times daily as needed for muscle spasms.    Historical Provider, MD  metoprolol tartrate (LOPRESSOR) 25 MG tablet Take 1 tablet (25 mg total) by mouth 2 (two) times daily. 11/24/14   Shanker Kristeen Mans, MD  Multiple Vitamin (MULTIVITAMIN WITH MINERALS) TABS Take 1 tablet by mouth daily.    Historical Provider, MD  Omega-3 Fatty Acids  (FISH OIL PO) Take 1 capsule by mouth daily.    Historical Provider, MD  predniSONE (DELTASONE) 5 MG tablet Take 2.5 mg by mouth daily.     Historical Provider, MD  Probiotic Product (PROBIOTIC PO) Take 1 tablet by mouth daily.    Historical Provider, MD  saccharomyces boulardii (FLORASTOR) 250 MG capsule Take 1 capsule (250 mg total) by mouth 2 (two) times daily. 11/24/14   Shanker Kristeen Mans, MD   BP 197/58 mmHg  Pulse 81  Temp(Src) 98 F (36.7 C) (Oral)  Resp 15  Ht 5\' 5"  (1.651 m)  Wt 231 lb (104.781 kg)  BMI 38.44 kg/m2  SpO2 96% Physical Exam  Constitutional: She is oriented to person, place, and time. She appears well-developed and well-nourished. No distress.  HENT:  Head: Normocephalic and atraumatic.  Right Ear: Hearing normal.  Left Ear: Hearing normal.  Nose: Nose normal.  Mouth/Throat: Oropharynx is clear and moist and mucous membranes are normal.  Eyes: Conjunctivae and EOM are normal. Pupils are equal, round, and reactive to light.  Neck: Normal range of motion. Neck supple.  Cardiovascular: Regular rhythm, S1 normal and S2 normal.  Exam reveals no gallop and no friction rub.   No murmur heard. Pulmonary/Chest: Effort normal and breath sounds normal. No respiratory distress. She exhibits no tenderness.  Abdominal: Soft. Normal appearance and bowel sounds are normal. There is no hepatosplenomegaly. There is no tenderness. There is no rebound, no guarding, no tenderness at McBurney's point and negative Murphy's sign. No hernia.  Musculoskeletal: Normal range of motion.  Neurological: She is alert and oriented to person, place, and time. She has normal strength. No cranial nerve deficit or sensory deficit. Coordination normal. GCS eye subscore is 4. GCS verbal subscore is 5. GCS motor subscore is 6.  Skin: Skin is warm, dry and intact. No rash noted. No cyanosis.  Psychiatric: She has a normal mood and affect. Her speech is normal and behavior is normal. Thought content  normal.  Nursing note and vitals reviewed.   ED Course  Procedures (including critical care time) DIAGNOSTIC STUDIES: Oxygen Saturation is 96% on RA, adequate by my interpretation.    COORDINATION OF CARE: 2:39 AM- Pt advised of plan for treatment and pt agrees. Will evaluate if fluid is elsewhere in her body with lung X-ray and blood work.    Labs Review Labs Reviewed  BASIC METABOLIC PANEL  CBC    Imaging Review No results found.   Orpah Greek, MD has personally reviewed and evaluated these images and lab results as part of his medical decision-making.   EKG Interpretation   Date/Time:  Wednesday  July 23 2015 02:24:00 EST Ventricular Rate:  86 PR Interval:  128 QRS Duration: 75 QT Interval:  358 QTC Calculation: 428 R Axis:   83 Text Interpretation:  Sinus tachycardia Multiple premature complexes, vent  & supraven Borderline right axis deviation Consider left ventricular  hypertrophy Baseline wander in lead(s) III No significant change since  last tracing Confirmed by Dakhari Zuver  MD, [place] [phone]) on 07/23/2015  2:52:01 AM      MDM   Final diagnoses:  None   Presents to the ER for evaluation of swelling of feet and legs with pain. She reports that she has had increased swelling over the last 1-2 weeks, but in the last 2 days it has significantly worsened. She is having trouble walking because of pain in her feet when she puts weight on them. There is no injury. There is no overlying skin changes to suggest an infection. There is no joint swelling. Swelling is bilaterally symmetric, no calf tenderness or venous cords. No concern for DVT. Workup does not suggest congestive heart failure. This is consistent with peripheral edema. Patient initiated on increased diuresis with Lasix here in the ER and has initiated diuresis.  Patient did report chest pain yesterday but has not had any tinnitus or through the period of evaluation in the ER. Her EKG is  unchanged from previous and troponin is negative. She does not require hospitalization for further evaluation, to follow-up with primary doctor.  I personally performed the services described in this documentation, which was scribed in my presence. The recorded information has been reviewed and is accurate.     Orpah Greek, MD 07/23/15 [phone]

## 2015-07-23 NOTE — ED Notes (Signed)
Reported pain medication request to Dr. Betsey Holiday. MD acknowledges. Awaiting orders.

## 2015-07-23 NOTE — ED Notes (Signed)
Pt verbalized understanding of d/c instructions, prescriptions, and follow-up care. No further questions/concerns, VSS, assisted to lobby in wheelchair.  

## 2015-07-23 NOTE — Discharge Instructions (Signed)

## 2015-07-29 ENCOUNTER — Emergency Department (HOSPITAL_COMMUNITY)
Admission: EM | Admit: 2015-07-29 | Discharge: 2015-07-29 | Disposition: A | Discharge status: Home / Self Care | Payer: Medicare HMO | Attending: Physician Assistant | Admitting: Physician Assistant

## 2015-07-29 ENCOUNTER — Encounter (HOSPITAL_COMMUNITY): Payer: Self-pay | Admitting: Emergency Medicine

## 2015-07-29 DIAGNOSIS — R011 Cardiac murmur, unspecified: Secondary | ICD-10-CM | POA: Diagnosis not present

## 2015-07-29 DIAGNOSIS — I129 Hypertensive chronic kidney disease with stage 1 through stage 4 chronic kidney disease, or unspecified chronic kidney disease: Secondary | ICD-10-CM | POA: Insufficient documentation

## 2015-07-29 DIAGNOSIS — Z79899 Other long term (current) drug therapy: Secondary | ICD-10-CM | POA: Insufficient documentation

## 2015-07-29 DIAGNOSIS — Z8639 Personal history of other endocrine, nutritional and metabolic disease: Secondary | ICD-10-CM | POA: Insufficient documentation

## 2015-07-29 DIAGNOSIS — M1990 Unspecified osteoarthritis, unspecified site: Secondary | ICD-10-CM | POA: Insufficient documentation

## 2015-07-29 DIAGNOSIS — M7989 Other specified soft tissue disorders: Secondary | ICD-10-CM | POA: Diagnosis present

## 2015-07-29 DIAGNOSIS — M109 Gout, unspecified: Secondary | ICD-10-CM | POA: Diagnosis not present

## 2015-07-29 DIAGNOSIS — N189 Chronic kidney disease, unspecified: Secondary | ICD-10-CM | POA: Insufficient documentation

## 2015-07-29 LAB — CBC WITH DIFFERENTIAL/PLATELET
BASOS ABS: 0 10*3/uL (ref 0.0–0.1)
Basophils Relative: 0 %
EOS ABS: 0 10*3/uL (ref 0.0–0.7)
EOS PCT: 1 %
HCT: 38.9 % (ref 36.0–46.0)
Hemoglobin: 11.9 g/dL — ABNORMAL LOW (ref 12.0–15.0)
LYMPHS PCT: 16 %
Lymphs Abs: 1.4 10*3/uL (ref 0.7–4.0)
MCH: 25.4 pg — ABNORMAL LOW (ref 26.0–34.0)
MCHC: 30.6 g/dL (ref 30.0–36.0)
MCV: 83.1 fL (ref 78.0–100.0)
MONO ABS: 0.1 10*3/uL (ref 0.1–1.0)
Monocytes Relative: 1 %
Neutro Abs: 7.1 10*3/uL (ref 1.7–7.7)
Neutrophils Relative %: 82 %
PLATELETS: 203 10*3/uL (ref 150–400)
RBC: 4.68 MIL/uL (ref 3.87–5.11)
RDW: 15.4 % (ref 11.5–15.5)
WBC: 8.7 10*3/uL (ref 4.0–10.5)

## 2015-07-29 LAB — BASIC METABOLIC PANEL
ANION GAP: 10 (ref 5–15)
BUN: 41 mg/dL — AB (ref 6–20)
CALCIUM: 9.1 mg/dL (ref 8.9–10.3)
CO2: 25 mmol/L (ref 22–32)
Chloride: 107 mmol/L (ref 101–111)
Creatinine, Ser: 2.04 mg/dL — ABNORMAL HIGH (ref 0.44–1.00)
GFR calc Af Amer: 25 mL/min — ABNORMAL LOW (ref >60)
GFR, EST NON AFRICAN AMERICAN: 21 mL/min — AB (ref >60)
GLUCOSE: 138 mg/dL — AB (ref 65–99)
Potassium: 4.3 mmol/L (ref 3.5–5.1)
SODIUM: 142 mmol/L (ref 135–145)

## 2015-07-29 LAB — URIC ACID: URIC ACID, SERUM: 10.7 mg/dL — AB (ref 2.3–6.6)

## 2015-07-29 MED ORDER — PREDNISONE 50 MG PO TABS: 50.0000 mg | ORAL_TABLET | Freq: Every day | ORAL | Status: DC

## 2015-07-29 MED ORDER — HYDROCODONE-ACETAMINOPHEN 5-325 MG PO TABS: 1.0000 | ORAL_TABLET | ORAL | Status: DC | PRN

## 2015-07-29 MED ORDER — PREDNISONE 20 MG PO TABS
60.0000 mg | ORAL_TABLET | Freq: Once | ORAL | Status: AC
  Administered 2015-07-29: 60 mg via ORAL
  Filled 2015-07-29: qty 3

## 2015-07-29 MED ORDER — OXYCODONE-ACETAMINOPHEN 5-325 MG PO TABS
1.0000 | ORAL_TABLET | Freq: Once | ORAL | Status: AC
  Administered 2015-07-29: 1 via ORAL
  Filled 2015-07-29: qty 1

## 2015-07-29 NOTE — ED Provider Notes (Signed)
CSN: [id number]     Arrival date & time 07/29/15  0615 History   First MD Initiated Contact with Patient 07/29/15 0631     Chief Complaint  Patient presents with  . Leg Swelling     HPI   [name] is an 81 y.o. female with history of HTN, HLD, CKD who presents to the ED for evaluation of bilateral second toe pain and swelling. Of note, she was seen in the ED on 11/23 for bilateral leg swelling and pain. Her workup was unremarkable and her swelling was deemed to be due to peripheral edema. Her lasix dose was increased at that time. Pt reports she has been doing much better since that visit and both legs are much less swollen. However, she continues to endorse bilateral second toe swelling and pain. She states she feels like her pain is getting worse and worse. She is unsure if the swelling is getting worse. States that her second toes (right one especially) feel hot and are getting to the point that she cannot walk on them anymore. Denies any new injury or trauma. Denies numbness, tingling, weakness. Denies fever, chills, chest pain, SOB. She has not been taking Elliquis in over two months.  Past Medical History  Diagnosis Date  . Hypertension   . Hypercholesteremia   . Arthritis   . Heart murmur     long time ago--no problems  . Chronic kidney disease     had some renal insufficiency.  Sees Dr. Moshe Cipro who took her off benicar in 2013.              Past Surgical History  Procedure Laterality Date  . Joint replacement      both knees  . Failed knee surgery      on the right x 2  . Cataract extraction w/phaco Right 03/20/2014    Procedure: CATARACT EXTRACTION PHACO AND INTRAOCULAR LENS PLACEMENT (IOC);  Surgeon: [name], MD;  Location: [facility];  Service: Ophthalmology;  Laterality: Right;   Family History  Problem Relation Age of Onset  . Diabetes Mellitus II Daughter   . Hypertension Daughter    Social History  Substance Use Topics  . Smoking status: Never Smoker   .  Smokeless tobacco: None  . Alcohol Use: Yes     Comment: occasional   OB History    No data available     Review of Systems  All other systems reviewed and are negative.     Allergies  Niaspan and Neosporin  Home Medications   Prior to Admission medications   Medication Sig Start Date End Date Taking? Authorizing Provider  ALPRAZolam Duanne Moron) 0.5 MG tablet Take 0.5 mg by mouth 3 (three) times daily as needed. For anxiety   Yes Historical Provider, MD  amLODipine (NORVASC) 10 MG tablet Take 10 mg by mouth daily.   Yes Historical Provider, MD  cilostazol (PLETAL) 100 MG tablet Take 100 mg by mouth 3 (three) times daily.  09/03/14  Yes Historical Provider, MD  ferrous sulfate 325 (65 FE) MG tablet Take 325 mg by mouth daily with breakfast.   Yes Historical Provider, MD  furosemide (LASIX) 40 MG tablet Take 80 mg by mouth 2 (two) times daily.    Yes Historical Provider, MD  geriatric multivitamins-minerals (ELDERTONIC/GEVRABON) ELIX Take 15 mLs by mouth 2 (two) times daily. 07/28/15  Yes Historical Provider, MD  HYDROcodone-acetaminophen (NORCO) 10-325 MG per tablet Take 1 tablet by mouth every 6 (six) hours as needed  for severe pain.  10/02/14  Yes Historical Provider, MD  Multiple Vitamin (MULTIVITAMIN WITH MINERALS) TABS Take 1 tablet by mouth daily.   Yes Historical Provider, MD  omeprazole (PRILOSEC) 20 MG capsule Take 20 mg by mouth 2 (two) times daily before a meal.   Yes Historical Provider, MD  apixaban (ELIQUIS) 5 MG TABS tablet Take 10 mg twice daily for 7 days then 5 mg daily until you see your physician. Patient not taking: Reported on 07/29/2015 12/06/14   Elnora Morrison, MD  Elastic Bandages & Supports (MEDICAL COMPRESSION STOCKINGS) MISC Size large, knee high 07/23/15   Orpah Greek, MD  HYDROcodone-acetaminophen (NORCO/VICODIN) 5-325 MG tablet Take 1 tablet by mouth every 4 (four) hours as needed. 07/29/15   Olivia Canter Ivannia Willhelm, PA-C  loperamide (IMODIUM) 2 MG capsule Take 1  capsule (2 mg total) by mouth as needed for diarrhea or loose stools. Patient not taking: Reported on 07/23/2015 11/24/14   Jonetta Osgood, MD  metoprolol tartrate (LOPRESSOR) 25 MG tablet Take 1 tablet (25 mg total) by mouth 2 (two) times daily. Patient not taking: Reported on 07/23/2015 11/24/14   Jonetta Osgood, MD  predniSONE (DELTASONE) 50 MG tablet Take 1 tablet (50 mg total) by mouth daily. 07/29/15   Olivia Canter Nicolet Griffy, PA-C  saccharomyces boulardii (FLORASTOR) 250 MG capsule Take 1 capsule (250 mg total) by mouth 2 (two) times daily. Patient not taking: Reported on 07/23/2015 11/24/14   Jonetta Osgood, MD   BP 172/48 mmHg  Pulse 68  Temp(Src) 98.7 F (37.1 C) (Oral)  Resp 16  SpO2 96% Physical Exam  Constitutional: She is oriented to person, place, and time. No distress.  HENT:  Head: Atraumatic.  Right Ear: External ear normal.  Left Ear: External ear normal.  Nose: Nose normal.  Mouth/Throat: Oropharynx is clear and moist. No oropharyngeal exudate.  Eyes: Conjunctivae and EOM are normal. Pupils are equal, round, and reactive to light.  Neck: Normal range of motion. Neck supple.  Cardiovascular: Normal rate, regular rhythm, normal heart sounds and intact distal pulses.   Pulmonary/Chest: Effort normal and breath sounds normal. No respiratory distress.  Abdominal: Soft. Bowel sounds are normal. She exhibits no distension. There is no tenderness.  Musculoskeletal:  Bilateral 2nd toes are swollen, skin is tight. Extremely tender to palpation. Warm. Otherwise no edema or tenderness in bilateral LE. Intact distal pulses. Intact strength and sensation.  Neurological: She is alert and oriented to person, place, and time. No cranial nerve deficit.  Skin: Skin is warm and dry. She is not diaphoretic.  Psychiatric: She has a normal mood and affect.  Nursing note and vitals reviewed.   ED Course  Procedures (including critical care time) Labs Review Labs Reviewed  CBC WITH  DIFFERENTIAL/PLATELET - Abnormal; Notable for the following:    Hemoglobin 11.9 (*)    MCH 25.4 (*)    All other components within normal limits  BASIC METABOLIC PANEL - Abnormal; Notable for the following:    Glucose, Bld 138 (*)    BUN 41 (*)    Creatinine, Ser 2.04 (*)    GFR calc non Af Amer 21 (*)    GFR calc Af Amer 25 (*)    All other components within normal limits  URIC ACID - Abnormal; Notable for the following:    Uric Acid, Serum 10.7 (*)    All other components within normal limits    Imaging Review No results found. I have personally reviewed and evaluated  these images and lab results as part of my medical decision-making.   EKG Interpretation None      MDM   Final diagnoses:  Acute gout of foot, unspecified cause, unspecified laterality    Ddx includes gout vs arthritis flare. I do not see signs of infection/cellulitis. Will check cbc, bmp, and uric acid level. Will treat with pain meds and prednisone. Will hold off on imaging for now.   Labs at pt's baseline other than elevated uric acid to 10.7. Will give 5 day course of prednisone and short course of vicodin. Pt to follow up with pcp this week. ER return precautions given. Pt and her family verbalized understanding.   Anne Ng, PA-C [phone]  Delora Fuel, MD [phone]

## 2015-07-29 NOTE — ED Notes (Signed)
Pt was in last Wed for leg swelling which has gotten much better however her toes continue to be swollen and causing her great pain

## 2015-07-29 NOTE — ED Notes (Signed)
MD at bedside. 

## 2015-07-29 NOTE — Discharge Instructions (Signed)
Your toe pain and swelling is likely due to gout. Your bloodwork today was otherwise unremarkable. I will give you a prescription for prednisone, a steroid. Please take it for the next five days until you finish the bottle. You may take tylenol as needed for pain. Please follow-up with your primary care provider within one week for follow-up. Return to the ER for new or worsening symptoms.   Please obtain all of your results from medical records or have your doctors office obtain the results - share them with your doctor - you should be seen at your doctors office in the next 2 days. Call today to arrange your follow up. Take the medications as prescribed. Please review all of the medicines and only take them if you do not have an allergy to them. Please be aware that if you are taking birth control pills, taking other prescriptions, ESPECIALLY ANTIBIOTICS may make the birth control ineffective - if this is the case, either do not engage in sexual activity or use alternative methods of birth control such as condoms until you have finished the medicine and your family doctor says it is OK to restart them. If you are on a blood thinner such as COUMADIN, be aware that any other medicine that you take may cause the coumadin to either work too much, or not enough - you should have your coumadin level rechecked in next 7 days if this is the case.  ?  It is also a possibility that you have an allergic reaction to any of the medicines that you have been prescribed - Everybody reacts differently to medications and while MOST people have no trouble with most medicines, you may have a reaction such as nausea, vomiting, rash, swelling, shortness of breath. If this is the case, please stop taking the medicine immediately and contact your physician.  ?  You should return to the ER if you develop severe or worsening symptoms.    Gout Gout is an inflammatory arthritis caused by a buildup of uric acid crystals in the  joints. Uric acid is a chemical that is normally present in the blood. When the level of uric acid in the blood is too high it can form crystals that deposit in your joints and tissues. This causes joint redness, soreness, and swelling (inflammation). Repeat attacks are common. Over time, uric acid crystals can form into masses (tophi) near a joint, destroying bone and causing disfigurement. Gout is treatable and often preventable. CAUSES  The disease begins with elevated levels of uric acid in the blood. Uric acid is produced by your body when it breaks down a naturally found substance called purines. Certain foods you eat, such as meats and fish, contain high amounts of purines. Causes of an elevated uric acid level include:  Being passed down from parent to child (heredity).  Diseases that cause increased uric acid production (such as obesity, psoriasis, and certain cancers).  Excessive alcohol use.  Diet, especially diets rich in meat and seafood.  Medicines, including certain cancer-fighting medicines (chemotherapy), water pills (diuretics), and aspirin.  Chronic kidney disease. The kidneys are no longer able to remove uric acid well.  Problems with metabolism. Conditions strongly associated with gout include:  Obesity.  High blood pressure.  High cholesterol.  Diabetes. Not everyone with elevated uric acid levels gets gout. It is not understood why some people get gout and others do not. Surgery, joint injury, and eating too much of certain foods are some of the factors  that can lead to gout attacks. SYMPTOMS   An attack of gout comes on quickly. It causes intense pain with redness, swelling, and warmth in a joint.  Fever can occur.  Often, only one joint is involved. Certain joints are more commonly involved:  Base of the big toe.  Knee.  Ankle.  Wrist.  Finger. Without treatment, an attack usually goes away in a few days to weeks. Between attacks, you usually will  not have symptoms, which is different from many other forms of arthritis. DIAGNOSIS  Your caregiver will suspect gout based on your symptoms and exam. In some cases, tests may be recommended. The tests may include:  Blood tests.  Urine tests.  X-rays.  Joint fluid exam. This exam requires a needle to remove fluid from the joint (arthrocentesis). Using a microscope, gout is confirmed when uric acid crystals are seen in the joint fluid. TREATMENT  There are two phases to gout treatment: treating the sudden onset (acute) attack and preventing attacks (prophylaxis).  Treatment of an Acute Attack.  Medicines are used. These include anti-inflammatory medicines or steroid medicines.  An injection of steroid medicine into the affected joint is sometimes necessary.  The painful joint is rested. Movement can worsen the arthritis.  You may use warm or cold treatments on painful joints, depending which works best for you.  Treatment to Prevent Attacks.  If you suffer from frequent gout attacks, your caregiver may advise preventive medicine. These medicines are started after the acute attack subsides. These medicines either help your kidneys eliminate uric acid from your body or decrease your uric acid production. You may need to stay on these medicines for a very long time.  The early phase of treatment with preventive medicine can be associated with an increase in acute gout attacks. For this reason, during the first few months of treatment, your caregiver may also advise you to take medicines usually used for acute gout treatment. Be sure you understand your caregiver's directions. Your caregiver may make several adjustments to your medicine dose before these medicines are effective.  Discuss dietary treatment with your caregiver or dietitian. Alcohol and drinks high in sugar and fructose and foods such as meat, poultry, and seafood can increase uric acid levels. Your caregiver or dietitian can  advise you on drinks and foods that should be limited. HOME CARE INSTRUCTIONS   Do not take aspirin to relieve pain. This raises uric acid levels.  Only take over-the-counter or prescription medicines for pain, discomfort, or fever as directed by your caregiver.  Rest the joint as much as possible. When in bed, keep sheets and blankets off painful areas.  Keep the affected joint raised (elevated).  Apply warm or cold treatments to painful joints. Use of warm or cold treatments depends on which works best for you.  Use crutches if the painful joint is in your leg.  Drink enough fluids to keep your urine clear or pale yellow. This helps your body get rid of uric acid. Limit alcohol, sugary drinks, and fructose drinks.  Follow your dietary instructions. Pay careful attention to the amount of protein you eat. Your daily diet should emphasize fruits, vegetables, whole grains, and fat-free or low-fat milk products. Discuss the use of coffee, vitamin C, and cherries with your caregiver or dietitian. These may be helpful in lowering uric acid levels.  Maintain a healthy body weight. SEEK MEDICAL CARE IF:   You develop diarrhea, vomiting, or any side effects from medicines.  You do  not feel better in 24 hours, or you are getting worse. SEEK IMMEDIATE MEDICAL CARE IF:   Your joint becomes suddenly more tender, and you have chills or a fever. MAKE SURE YOU:   Understand these instructions.  Will watch your condition.  Will get help right away if you are not doing well or get worse.   This information is not intended to replace advice given to you by your health care provider. Make sure you discuss any questions you have with your health care provider.   Document Released: 08/13/2000 Document Revised: 09/06/2014 Document Reviewed: 03/29/2012 Elsevier Interactive Patient Education [employer].

## 2015-09-12 IMAGING — CR DG CHEST 2V
2 series · 2 of 2 positions shown · non-contrast
Comparison: 12/19/2008

CLINICAL DATA: Hypertension.  Heart murmur.

EXAM:
CHEST  2 VIEW

[w chest pa]
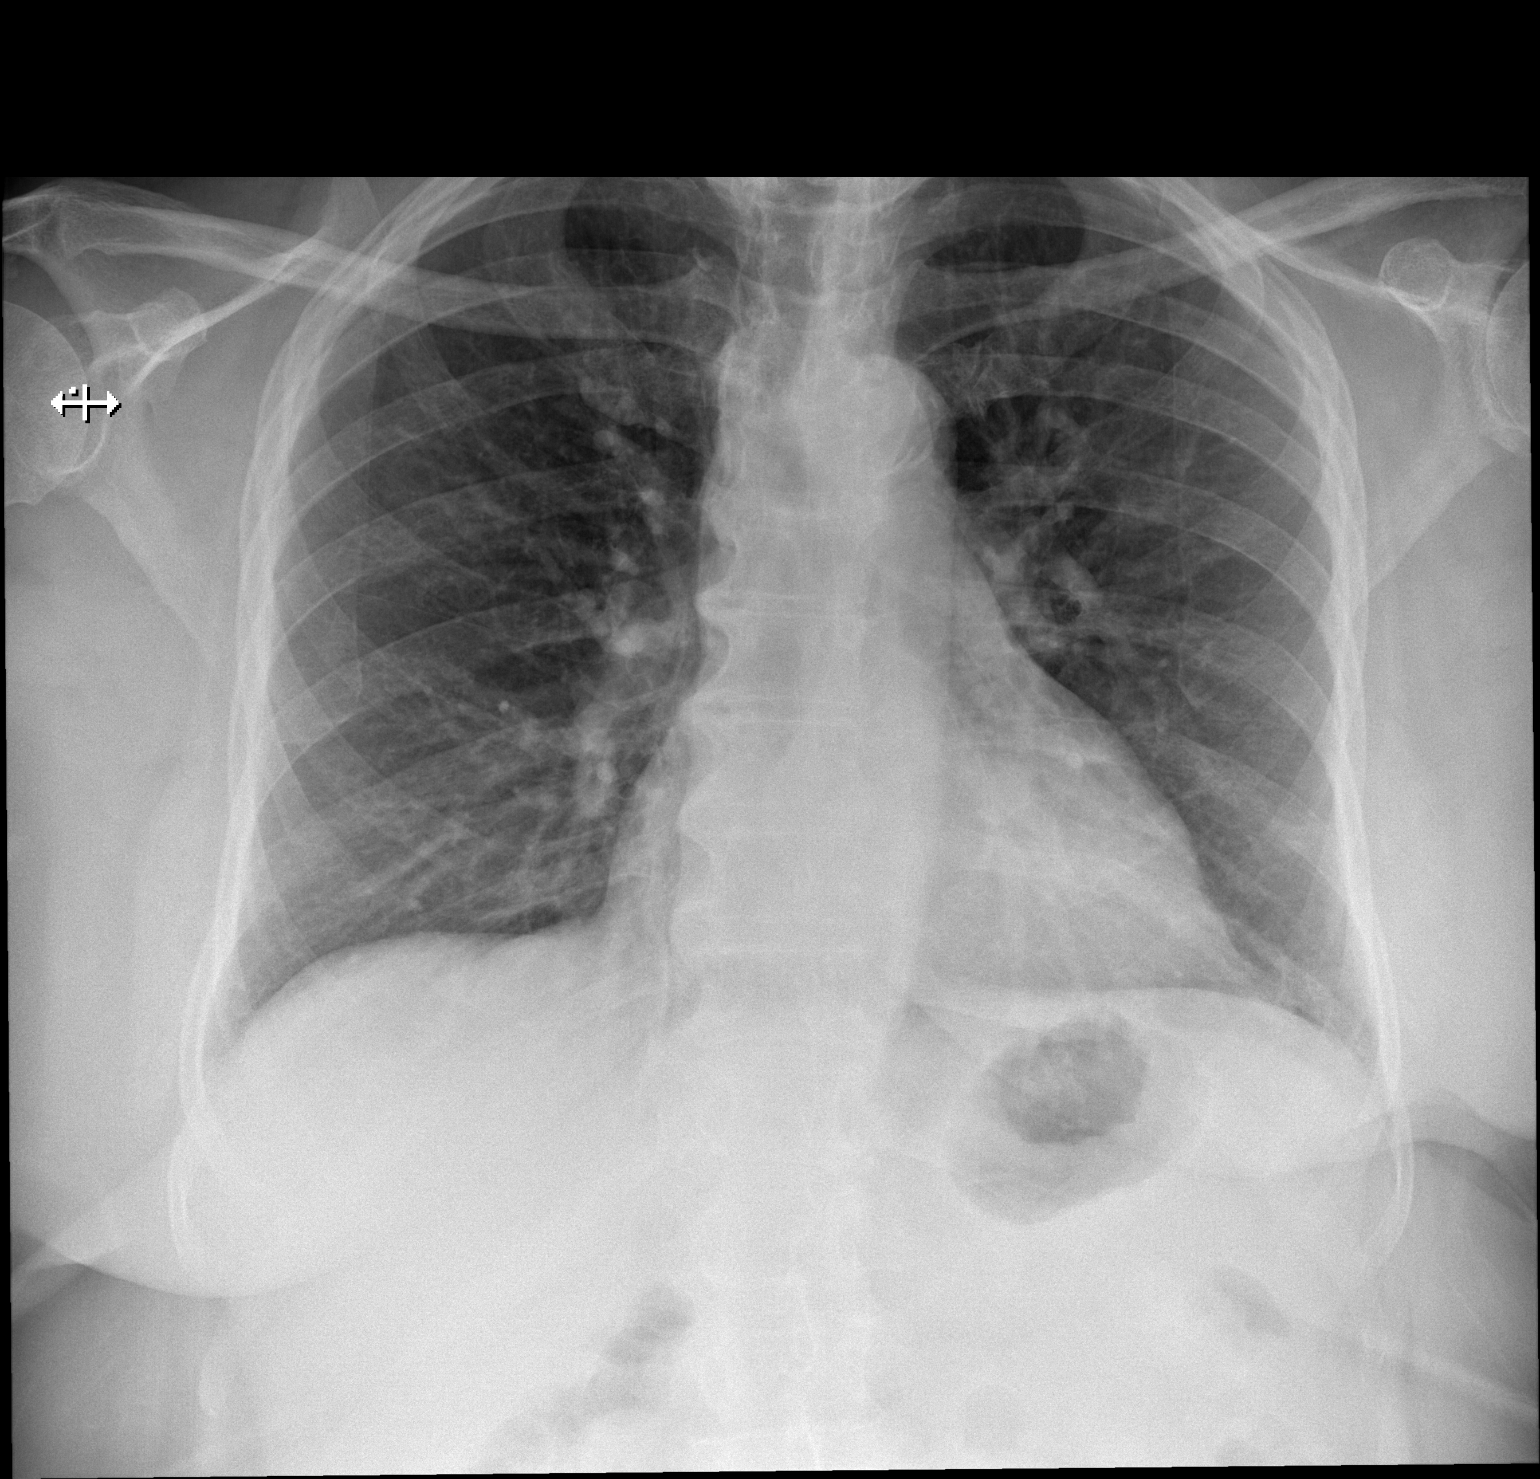

[w chest lat]
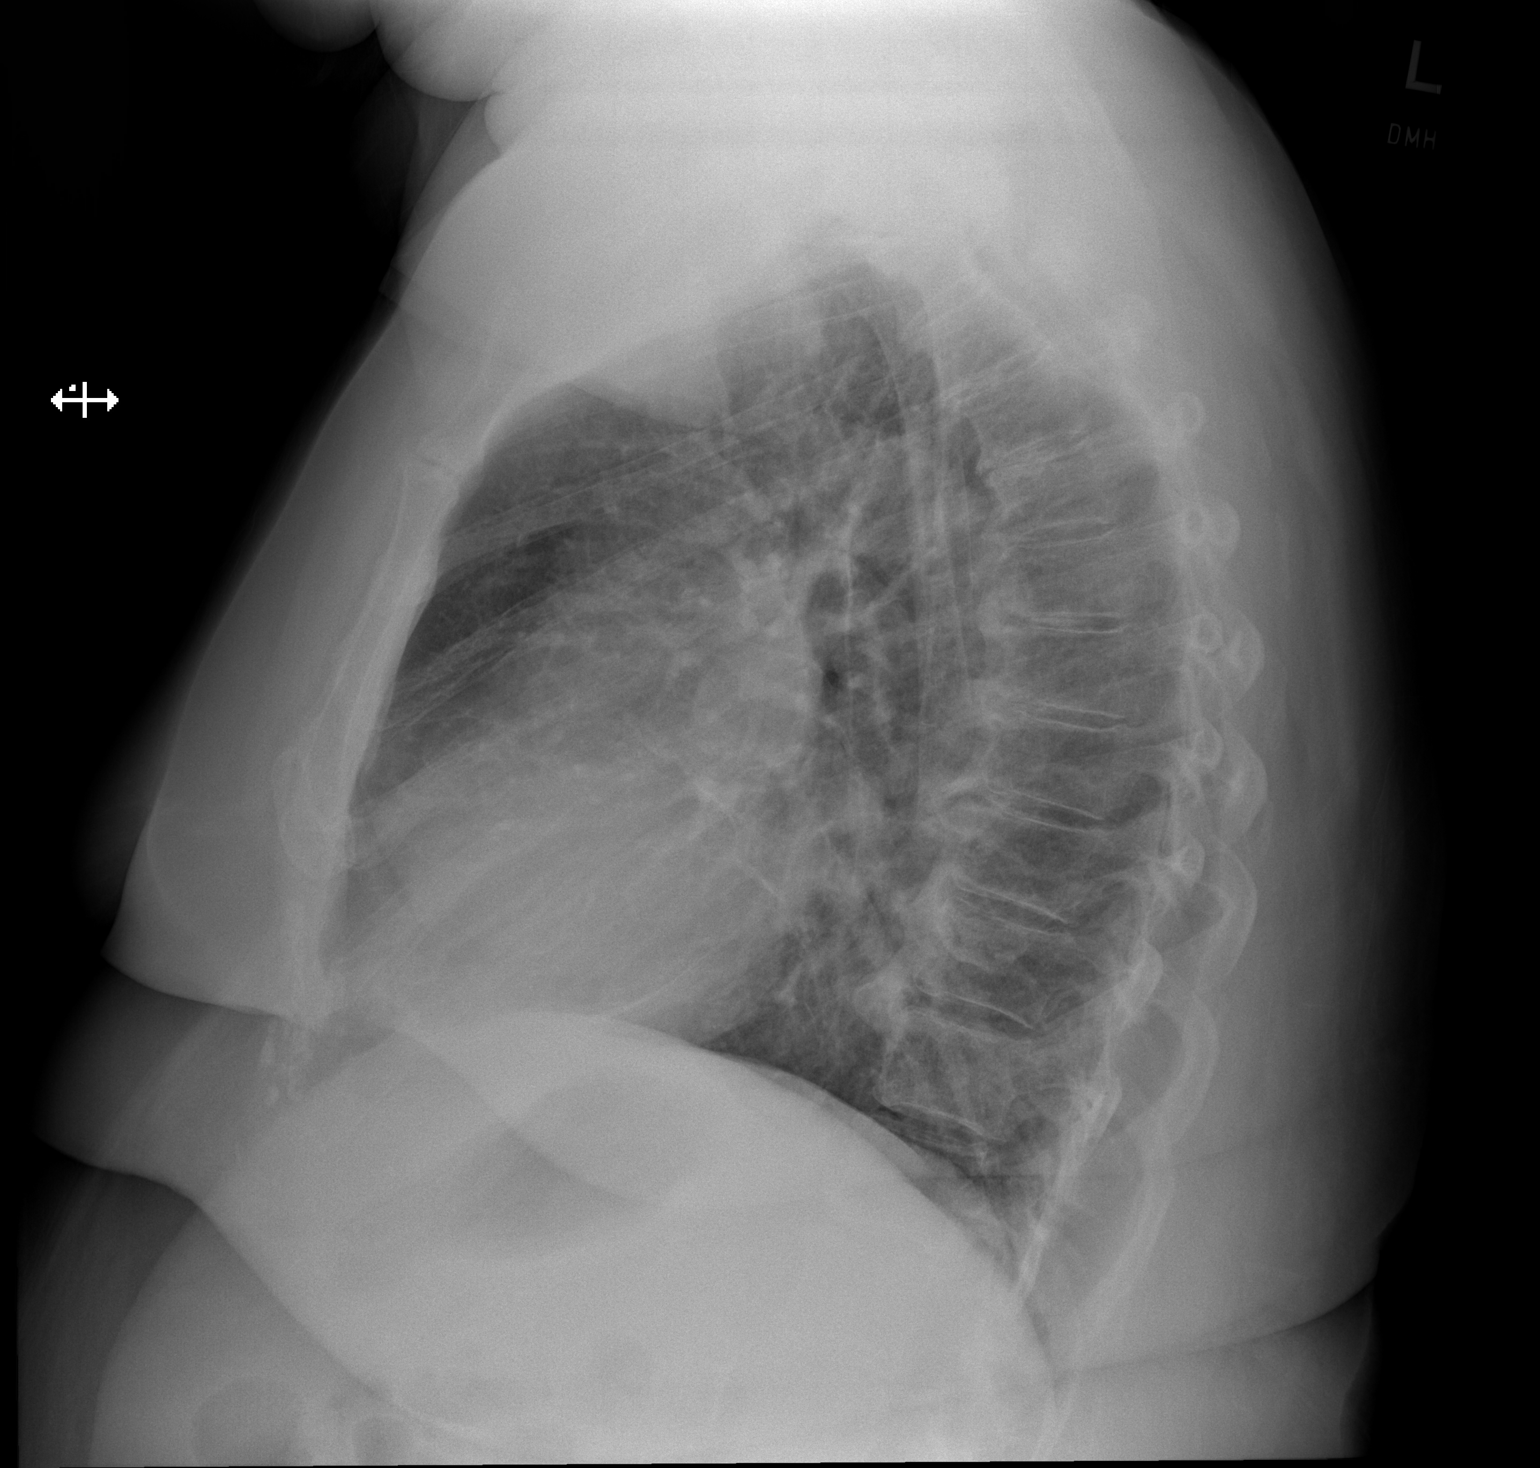

[2 of 2 positions shown; findings below may reference images not displayed]

FINDINGS: Cardiac silhouette is normal in size. Normal mediastinal and hilar
contours.

Clear lungs.  No pleural effusion.  No pneumothorax.

Bony thorax is intact.
IMPRESSION: No active cardiopulmonary disease.

## 2016-01-01 ENCOUNTER — Emergency Department (HOSPITAL_COMMUNITY): Payer: Medicare HMO

## 2016-01-01 ENCOUNTER — Encounter (HOSPITAL_COMMUNITY): Payer: Self-pay

## 2016-01-01 ENCOUNTER — Inpatient Hospital Stay (HOSPITAL_COMMUNITY)
Admission: EM | Admit: 2016-01-01 | Discharge: 2016-01-07 | DRG: 291 | Disposition: A | Discharge status: Home Health | Payer: Medicare HMO | Attending: Internal Medicine | Admitting: Internal Medicine

## 2016-01-01 DIAGNOSIS — Z7901 Long term (current) use of anticoagulants: Secondary | ICD-10-CM

## 2016-01-01 DIAGNOSIS — I10 Essential (primary) hypertension: Secondary | ICD-10-CM

## 2016-01-01 DIAGNOSIS — N184 Chronic kidney disease, stage 4 (severe): Secondary | ICD-10-CM | POA: Diagnosis not present

## 2016-01-01 DIAGNOSIS — R0600 Dyspnea, unspecified: Secondary | ICD-10-CM

## 2016-01-01 DIAGNOSIS — D509 Iron deficiency anemia, unspecified: Secondary | ICD-10-CM

## 2016-01-01 DIAGNOSIS — Z96653 Presence of artificial knee joint, bilateral: Secondary | ICD-10-CM | POA: Diagnosis present

## 2016-01-01 DIAGNOSIS — E872 Acidosis, unspecified: Secondary | ICD-10-CM | POA: Diagnosis present

## 2016-01-01 DIAGNOSIS — D631 Anemia in chronic kidney disease: Secondary | ICD-10-CM | POA: Diagnosis present

## 2016-01-01 DIAGNOSIS — I82402 Acute embolism and thrombosis of unspecified deep veins of left lower extremity: Secondary | ICD-10-CM

## 2016-01-01 DIAGNOSIS — I5031 Acute diastolic (congestive) heart failure: Secondary | ICD-10-CM | POA: Diagnosis not present

## 2016-01-01 DIAGNOSIS — I130 Hypertensive heart and chronic kidney disease with heart failure and stage 1 through stage 4 chronic kidney disease, or unspecified chronic kidney disease: Principal | ICD-10-CM | POA: Diagnosis present

## 2016-01-01 DIAGNOSIS — R509 Fever, unspecified: Secondary | ICD-10-CM

## 2016-01-01 DIAGNOSIS — R0602 Shortness of breath: Secondary | ICD-10-CM | POA: Diagnosis not present

## 2016-01-01 DIAGNOSIS — Z961 Presence of intraocular lens: Secondary | ICD-10-CM | POA: Diagnosis present

## 2016-01-01 DIAGNOSIS — Z86718 Personal history of other venous thrombosis and embolism: Secondary | ICD-10-CM | POA: Insufficient documentation

## 2016-01-01 DIAGNOSIS — I5032 Chronic diastolic (congestive) heart failure: Secondary | ICD-10-CM | POA: Diagnosis present

## 2016-01-01 DIAGNOSIS — I70219 Atherosclerosis of native arteries of extremities with intermittent claudication, unspecified extremity: Secondary | ICD-10-CM | POA: Diagnosis present

## 2016-01-01 DIAGNOSIS — Z7952 Long term (current) use of systemic steroids: Secondary | ICD-10-CM

## 2016-01-01 DIAGNOSIS — J9601 Acute respiratory failure with hypoxia: Secondary | ICD-10-CM | POA: Diagnosis not present

## 2016-01-01 DIAGNOSIS — Z9111 Patient's noncompliance with dietary regimen: Secondary | ICD-10-CM

## 2016-01-01 DIAGNOSIS — F419 Anxiety disorder, unspecified: Secondary | ICD-10-CM | POA: Diagnosis present

## 2016-01-01 DIAGNOSIS — Z9842 Cataract extraction status, left eye: Secondary | ICD-10-CM

## 2016-01-01 DIAGNOSIS — I5033 Acute on chronic diastolic (congestive) heart failure: Secondary | ICD-10-CM | POA: Diagnosis present

## 2016-01-01 DIAGNOSIS — K219 Gastro-esophageal reflux disease without esophagitis: Secondary | ICD-10-CM | POA: Diagnosis present

## 2016-01-01 DIAGNOSIS — Z66 Do not resuscitate: Secondary | ICD-10-CM | POA: Diagnosis present

## 2016-01-01 DIAGNOSIS — N189 Chronic kidney disease, unspecified: Secondary | ICD-10-CM

## 2016-01-01 DIAGNOSIS — Z9841 Cataract extraction status, right eye: Secondary | ICD-10-CM

## 2016-01-01 LAB — CBC
HCT: 32.3 % — ABNORMAL LOW (ref 36.0–46.0)
HEMOGLOBIN: 9.9 g/dL — AB (ref 12.0–15.0)
MCH: 25.1 pg — AB (ref 26.0–34.0)
MCHC: 30.7 g/dL (ref 30.0–36.0)
MCV: 81.8 fL (ref 78.0–100.0)
Platelets: 198 10*3/uL (ref 150–400)
RBC: 3.95 MIL/uL (ref 3.87–5.11)
RDW: 15.8 % — ABNORMAL HIGH (ref 11.5–15.5)
WBC: 7.6 10*3/uL (ref 4.0–10.5)

## 2016-01-01 LAB — URINALYSIS, ROUTINE W REFLEX MICROSCOPIC
BILIRUBIN URINE: NEGATIVE
Glucose, UA: NEGATIVE mg/dL
HGB URINE DIPSTICK: NEGATIVE
KETONES UR: NEGATIVE mg/dL
Leukocytes, UA: NEGATIVE
NITRITE: NEGATIVE
Protein, ur: NEGATIVE mg/dL
Specific Gravity, Urine: 1.008 (ref 1.005–1.030)
pH: 5.5 (ref 5.0–8.0)

## 2016-01-01 LAB — COMPREHENSIVE METABOLIC PANEL
ALK PHOS: 56 U/L (ref 38–126)
ALT: 31 U/L (ref 14–54)
AST: 31 U/L (ref 15–41)
Albumin: 3.4 g/dL — ABNORMAL LOW (ref 3.5–5.0)
Anion gap: 15 (ref 5–15)
BUN: 70 mg/dL — ABNORMAL HIGH (ref 6–20)
CALCIUM: 8.8 mg/dL — AB (ref 8.9–10.3)
CO2: 17 mmol/L — AB (ref 22–32)
CREATININE: 1.17 mg/dL — AB (ref 0.44–1.00)
Chloride: 102 mmol/L (ref 101–111)
GFR, EST AFRICAN AMERICAN: 48 mL/min — AB (ref >60)
GFR, EST NON AFRICAN AMERICAN: 42 mL/min — AB (ref >60)
Glucose, Bld: 166 mg/dL — ABNORMAL HIGH (ref 65–99)
Potassium: 4.2 mmol/L (ref 3.5–5.1)
Sodium: 134 mmol/L — ABNORMAL LOW (ref 135–145)
Total Bilirubin: 0.3 mg/dL (ref 0.3–1.2)
Total Protein: 6.5 g/dL (ref 6.5–8.1)

## 2016-01-01 LAB — BRAIN NATRIURETIC PEPTIDE: B NATRIURETIC PEPTIDE 5: 1032.3 pg/mL — AB (ref 0.0–100.0)

## 2016-01-01 LAB — I-STAT TROPONIN, ED: TROPONIN I, POC: 0.01 ng/mL (ref 0.00–0.08)

## 2016-01-01 LAB — TSH: TSH: 2.008 u[IU]/mL (ref 0.350–4.500)

## 2016-01-01 LAB — TROPONIN I: TROPONIN I: 0.04 ng/mL — AB (ref <0.031)

## 2016-01-01 MED ORDER — AMLODIPINE BESYLATE 10 MG PO TABS
10.0000 mg | ORAL_TABLET | Freq: Every day | ORAL | Status: DC
  Administered 2016-01-02 – 2016-01-04 (×3): 10 mg via ORAL
  Filled 2016-01-01 (×4): qty 1

## 2016-01-01 MED ORDER — ALLOPURINOL 100 MG PO TABS
100.0000 mg | ORAL_TABLET | Freq: Two times a day (BID) | ORAL | Status: DC
  Administered 2016-01-01 – 2016-01-07 (×12): 100 mg via ORAL
  Filled 2016-01-01 (×12): qty 1

## 2016-01-01 MED ORDER — HYDROCODONE-ACETAMINOPHEN 10-325 MG PO TABS
1.0000 | ORAL_TABLET | Freq: Four times a day (QID) | ORAL | Status: DC | PRN
  Administered 2016-01-02 – 2016-01-06 (×10): 1 via ORAL
  Filled 2016-01-01 (×10): qty 1

## 2016-01-01 MED ORDER — HEPARIN SODIUM (PORCINE) 5000 UNIT/ML IJ SOLN
5000.0000 [IU] | Freq: Three times a day (TID) | INTRAMUSCULAR | Status: DC
  Administered 2016-01-01 – 2016-01-07 (×16): 5000 [IU] via SUBCUTANEOUS
  Filled 2016-01-01 (×15): qty 1

## 2016-01-01 MED ORDER — FUROSEMIDE 10 MG/ML IJ SOLN
80.0000 mg | Freq: Two times a day (BID) | INTRAMUSCULAR | Status: DC
  Administered 2016-01-02 – 2016-01-05 (×8): 80 mg via INTRAVENOUS
  Filled 2016-01-01 (×10): qty 8

## 2016-01-01 MED ORDER — SODIUM CHLORIDE 0.9% FLUSH: 3.0000 mL | INTRAVENOUS | Status: DC | PRN

## 2016-01-01 MED ORDER — HYDRALAZINE HCL 20 MG/ML IJ SOLN: 10.0000 mg | INTRAMUSCULAR | Status: DC | PRN

## 2016-01-01 MED ORDER — SODIUM CHLORIDE 0.9 % IV SOLN: 250.0000 mL | INTRAVENOUS | Status: DC | PRN

## 2016-01-01 MED ORDER — FERROUS SULFATE 325 (65 FE) MG PO TABS
325.0000 mg | ORAL_TABLET | Freq: Every day | ORAL | Status: DC
  Administered 2016-01-02 – 2016-01-07 (×6): 325 mg via ORAL
  Filled 2016-01-01 (×7): qty 1

## 2016-01-01 MED ORDER — METOPROLOL TARTRATE 50 MG PO TABS
50.0000 mg | ORAL_TABLET | Freq: Two times a day (BID) | ORAL | Status: DC
  Administered 2016-01-01 – 2016-01-04 (×6): 50 mg via ORAL
  Filled 2016-01-01 (×8): qty 1

## 2016-01-01 MED ORDER — HYDRALAZINE HCL 50 MG PO TABS
100.0000 mg | ORAL_TABLET | Freq: Two times a day (BID) | ORAL | Status: DC
  Administered 2016-01-01 – 2016-01-04 (×7): 100 mg via ORAL
  Filled 2016-01-01 (×8): qty 2

## 2016-01-01 MED ORDER — SODIUM CHLORIDE 0.9% FLUSH
3.0000 mL | Freq: Two times a day (BID) | INTRAVENOUS | Status: DC
  Administered 2016-01-02 – 2016-01-07 (×11): 3 mL via INTRAVENOUS

## 2016-01-01 MED ORDER — ALPRAZOLAM 0.5 MG PO TABS: 0.5000 mg | ORAL_TABLET | Freq: Three times a day (TID) | ORAL | Status: DC | PRN

## 2016-01-01 MED ORDER — FUROSEMIDE 10 MG/ML IJ SOLN
120.0000 mg | Freq: Once | INTRAMUSCULAR | Status: AC
  Administered 2016-01-01: 120 mg via INTRAVENOUS
  Filled 2016-01-01: qty 12

## 2016-01-01 MED ORDER — LORAZEPAM 2 MG/ML IJ SOLN
0.5000 mg | Freq: Once | INTRAMUSCULAR | Status: AC
  Administered 2016-01-01: 0.5 mg via INTRAVENOUS
  Filled 2016-01-01: qty 1

## 2016-01-01 MED ORDER — PANTOPRAZOLE SODIUM 40 MG PO TBEC
40.0000 mg | DELAYED_RELEASE_TABLET | Freq: Every day | ORAL | Status: DC
  Administered 2016-01-02 – 2016-01-07 (×6): 40 mg via ORAL
  Filled 2016-01-01 (×6): qty 1

## 2016-01-01 MED ORDER — ONDANSETRON HCL 4 MG/2ML IJ SOLN: 4.0000 mg | Freq: Four times a day (QID) | INTRAMUSCULAR | Status: DC | PRN

## 2016-01-01 MED ORDER — ACETAMINOPHEN 325 MG PO TABS: 650.0000 mg | ORAL_TABLET | ORAL | Status: DC | PRN

## 2016-01-01 MED ORDER — CILOSTAZOL 100 MG PO TABS
100.0000 mg | ORAL_TABLET | Freq: Two times a day (BID) | ORAL | Status: DC
  Administered 2016-01-01 – 2016-01-07 (×11): 100 mg via ORAL
  Filled 2016-01-01 (×12): qty 1

## 2016-01-01 MED ORDER — FUROSEMIDE 10 MG/ML IJ SOLN: 80.0000 mg | Freq: Two times a day (BID) | INTRAMUSCULAR | Status: DC

## 2016-01-01 NOTE — ED Provider Notes (Signed)
CSN: [id number]     Arrival date & time 01/01/16  1537 History   First MD Initiated Contact with Patient 01/01/16 1605     Chief Complaint  Patient presents with  . Shortness of Breath     (Consider location/radiation/quality/duration/timing/severity/associated sxs/prior Treatment) HPI Patient presents with concern of dyspnea and fatigue.  She has had dyspnea for a long time, but in particular over the past 24 hours she has had increasing dyspnea. No substantial chest pain, no syncope. Patient denies history of congestive heart failure, acknowledges a history of gout and renal dysfunction. Over the past month patient has had diffuse edema, and has seen her physician. She has had increased doses of Lasix, and is currently taking 80 mg, daily. However, she continues to have progressive dyspnea, fatigue. No confusion, disorientation. No fever, cough. Patient also has a history of gout, was recently on a course of medication for this. However, she denies current asymmetry in pain in her lower extremities, or superficial changes.     Past Medical History  Diagnosis Date  . Hypertension   . Hypercholesteremia   . Arthritis   . Heart murmur     long time ago--no problems  . Chronic kidney disease     had some renal insufficiency.  Sees Dr. Moshe Cipro who took her off benicar in 2013.              Past Surgical History  Procedure Laterality Date  . Joint replacement      both knees  . Failed knee surgery      on the right x 2  . Cataract extraction w/phaco Right 03/20/2014    Procedure: CATARACT EXTRACTION PHACO AND INTRAOCULAR LENS PLACEMENT (IOC);  Surgeon: [name], MD;  Location: [facility];  Service: Ophthalmology;  Laterality: Right;   Family History  Problem Relation Age of Onset  . Diabetes Mellitus II Daughter   . Hypertension Daughter    Social History  Substance Use Topics  . Smoking status: Never Smoker   . Smokeless tobacco: None  . Alcohol Use: Yes      Comment: occasional   OB History    No data available     Review of Systems  Constitutional:       Per HPI, otherwise negative  HENT:       Per HPI, otherwise negative  Respiratory:       Per HPI, otherwise negative  Cardiovascular:       Per HPI, otherwise negative  Gastrointestinal: Negative for vomiting.  Endocrine:       Negative aside from HPI  Genitourinary:       Neg aside from HPI   Musculoskeletal:       Per HPI, otherwise negative  Skin: Negative.   Neurological: Negative for syncope.      Allergies  Naprosyn  Home Medications   Prior to Admission medications   Medication Sig Start Date End Date Taking? Authorizing Provider  ALPRAZolam Duanne Moron) 0.5 MG tablet Take 0.5 mg by mouth 3 (three) times daily as needed. For anxiety    Historical Provider, MD  amLODipine (NORVASC) 10 MG tablet Take 10 mg by mouth daily.    Historical Provider, MD  apixaban (ELIQUIS) 5 MG TABS tablet Take 10 mg twice daily for 7 days then 5 mg daily until you see your physician. Patient not taking: Reported on 07/29/2015 12/06/14   Elnora Morrison, MD  cilostazol (PLETAL) 100 MG tablet Take 100 mg by mouth 3 (three) times  daily.  09/03/14   Historical Provider, MD  Elastic Bandages & Supports (MEDICAL COMPRESSION STOCKINGS) MISC Size large, knee high 07/23/15   Orpah Greek, MD  ferrous sulfate 325 (65 FE) MG tablet Take 325 mg by mouth daily with breakfast.    Historical Provider, MD  furosemide (LASIX) 40 MG tablet Take 80 mg by mouth 2 (two) times daily.     Historical Provider, MD  geriatric multivitamins-minerals (ELDERTONIC/GEVRABON) ELIX Take 15 mLs by mouth 2 (two) times daily. 07/28/15   Historical Provider, MD  HYDROcodone-acetaminophen (NORCO) 10-325 MG per tablet Take 1 tablet by mouth every 6 (six) hours as needed for severe pain.  10/02/14   Historical Provider, MD  HYDROcodone-acetaminophen (NORCO/VICODIN) 5-325 MG tablet Take 1 tablet by mouth every 4 (four) hours as  needed. 07/29/15   Olivia Canter Sam, PA-C  loperamide (IMODIUM) 2 MG capsule Take 1 capsule (2 mg total) by mouth as needed for diarrhea or loose stools. Patient not taking: Reported on 07/23/2015 11/24/14   Jonetta Osgood, MD  metoprolol tartrate (LOPRESSOR) 25 MG tablet Take 1 tablet (25 mg total) by mouth 2 (two) times daily. Patient not taking: Reported on 07/23/2015 11/24/14   Jonetta Osgood, MD  Multiple Vitamin (MULTIVITAMIN WITH MINERALS) TABS Take 1 tablet by mouth daily.    Historical Provider, MD  omeprazole (PRILOSEC) 20 MG capsule Take 20 mg by mouth 2 (two) times daily before a meal.    Historical Provider, MD  predniSONE (DELTASONE) 50 MG tablet Take 1 tablet (50 mg total) by mouth daily. 07/29/15   Olivia Canter Sam, PA-C  saccharomyces boulardii (FLORASTOR) 250 MG capsule Take 1 capsule (250 mg total) by mouth 2 (two) times daily. Patient not taking: Reported on 07/23/2015 11/24/14   Jonetta Osgood, MD   BP 135/48 mmHg  Pulse 60  Temp(Src) 97.3 F (36.3 C)  Resp 22  Ht 5\' 5"  (1.651 m)  Wt 239 lb (108.41 kg)  BMI 39.77 kg/m2  SpO2 100% Physical Exam  Constitutional: She is oriented to person, place, and time. She appears well-nourished. No distress.  Morbidly obese elderly female awake, alert, but with obvious tachypnea.   HENT:  Head: Normocephalic and atraumatic.  Eyes: Conjunctivae and EOM are normal.  Cardiovascular: Normal rate and regular rhythm.   Pulmonary/Chest: No stridor. Tachypnea noted. She is in respiratory distress. She has decreased breath sounds. She has no wheezes.  Large habitus, poor auscultation  Abdominal: She exhibits no distension.  Musculoskeletal: She exhibits no edema.  Diffuse symmetric bilateral lower extremity edema, nonpitting. No appreciable induration, no erythema in the patient moves each ankle spontaneously, seemingly without limitation.   Neurological: She is alert and oriented to person, place, and time. No cranial nerve deficit.   Skin: Skin is warm and dry.  Psychiatric: She has a normal mood and affect.  Nursing note and vitals reviewed.   ED Course  Procedures (including critical care time) Labs Review Labs Reviewed  CBC - Abnormal; Notable for the following:    Hemoglobin 9.9 (*)    HCT 32.3 (*)    MCH 25.1 (*)    RDW 15.8 (*)    All other components within normal limits  COMPREHENSIVE METABOLIC PANEL - Abnormal; Notable for the following:    Sodium 134 (*)    CO2 17 (*)    Glucose, Bld 166 (*)    BUN 70 (*)    Creatinine, Ser 1.17 (*)    Calcium 8.8 (*)  Albumin 3.4 (*)    GFR calc non Af Amer 42 (*)    GFR calc Af Amer 48 (*)    All other components within normal limits  BRAIN NATRIURETIC PEPTIDE - Abnormal; Notable for the following:    B Natriuretic Peptide 1032.3 (*)    All other components within normal limits  I-STAT TROPOININ, ED    Imaging Review Dg Chest 2 View  01/01/2016  CLINICAL DATA:  Shortness of breath. EXAM: CHEST  2 VIEW COMPARISON:  July 23, 2015. FINDINGS: Stable cardiomediastinal silhouette. No pneumothorax is noted. Interval placement of bilateral interstitial densities concerning for pulmonary edema with associated mild bilateral pleural effusions. Bony thorax is unremarkable. IMPRESSION: Interval development of bilateral pulmonary edema with associated mild pleural effusions. Electronically Signed   By: Marijo Conception, M.D.   On: 01/01/2016 16:17   I have personally reviewed and evaluated these images and lab results as part of my medical decision-making.   EKG Interpretation   Date/Time:  Thursday Jan 01 2016 15:46:10 EDT Ventricular Rate:  60 PR Interval:  150 QRS Duration: 86 QT Interval:  474 QTC Calculation: 474 R Axis:   87 Text Interpretation:  Normal sinus rhythm Normal ECG Sinus rhythm Artifact  Normal ECG Confirmed by Carmin Muskrat  MD [phone]) on 01/01/2016 4:06:05 PM      Last echocardiogram 1 year ago Transthoracic  Echocardiography  Patient:    Catana, Crofton MR #:       [id number] Study Date: 11/24/2014 Gender:     F Age:        89 Height:     165.1 cm Weight:     104.3 kg BSA:        2.24 m^2 Pt. Status: Room:       6E04C   ADMITTING    Rise Patience  ATTENDING    Ghimire, Shanker M  ORDERING     Ghimire, Shanker M  REFERRING    Ghimire, Shanker M  PERFORMING   [facility], Inpatient  SONOGRAPHER  Minus Breeding  cc:  ------------------------------------------------------------------- LV EF: 65% -   70%  ------------------------------------------------------------------- Indications:      CHF - 428.0.  ------------------------------------------------------------------- History:   Risk factors:  Hypertension.  ------------------------------------------------------------------- Study Conclusions  - Left ventricle: The cavity size was normal. Systolic function was   vigorous. The estimated ejection fraction was in the range of 65%   to 70%. Wall motion was normal; there were no regional wall   motion abnormalities. Doppler parameters are consistent with   abnormal left ventricular relaxation (grade 1 diastolic   dysfunction). Doppler parameters are consistent with high   ventricular filling pressure. Severe asymmetric septal   hypertrophy (1.8 cm). - Aortic valve: Mildly calcified annulus. Trileaflet; mildly   thickened, mildly calcified leaflets. There was no stenosis. - Mitral valve: Mildly calcified annulus. There was mild   regurgitation. - Left atrium: The atrium was mildly to moderately dilated. - Systemic veins: IVC dilated with normal respiratory variation.   Estimated CVP 8 mmHg.  Transthoracic echocardiography.  M-mode, complete 2D, spectral Doppler, and color Doppler.  Birthdate:  Patient birthdate: 14-Jun-1929.  Age:  Patient is 82 yr old.  Sex:  Gender: female. BMI: 38.3 kg/m^2.  Blood pressure:     159/38  Patient status: Inpatient.  Study date:  Study date:  11/24/2014. Study time: 08:50 AM.  Location:  Bedside.   Initial labs notable for elevated BNP. Given the patient's current use of Lasix, 80 mg,  daily she will receive 150%. Patient's renal function compared to prior values here is consistent, though compared to report, provided by family, of outside labs with creatinine of 3, and appears slightly better.   MDM   elderly, obese female presents with dyspnea, edema. Here the patient is awake and alert, afebrile. However, the patient is found to have pulmonary edema, elevated BNP, increased work of breathing CONCERNING for congestive heart failure. Patient also has poor renal function. Patient was initiated on IV Lasix therapy, admitted for further evaluation and management.    Carmin Muskrat, MD 01/01/16 [phone]

## 2016-01-01 NOTE — ED Notes (Signed)
Report already called. Pt waiting on IV team. Pt needs a lasix drip.

## 2016-01-01 NOTE — ED Notes (Signed)
Attempted to transport pt up to [facility], admitting at bedside.

## 2016-01-01 NOTE — H&P (Addendum)
History and Physical    [name] [id number] DOB: 12-09-1928 DOA: 01/01/2016  Referring MD/NP/PA: Carmin Muskrat PCP: Antonietta Jewel, MD  Outpatient Specialists: Dr. Moshe Cipro, Nephrology   Patient coming from: Home  Chief Complaint:  Shortness of breath  HPI: [name] is a 82 y.o. female with medical history significant of hypertension, hyperlipidemia, chronic kidney disease stage IV with a baseline creatinine of approximately 2-3, chronic diastolic heart failure, and posthospitalization DVT s/p 3 months of Eliquis who presents with shortness of breath.  She started having mild DOE about 1 month ago.  She denies that her breathing gets worse when she is lying flat, but then states that she feels better with the head of the bed elevated.  Increasing lower extremity edema, bloating.  She has had poor appetite and has been drinking lots of canned soup because she didn't feel well.  She has continued to take her lasix 80mg  po BID, same dose she has been on for the last year, but despite that her weight has increased from 208-lbs about two weeks ago to 239-lbs today.  She saw her PCP earlier this week for dyspnea and was given a prescription for antibiotics and prednisone which did not help.  She has been afebrile without sore throat, rhinorrhea.  She was very SOB at rest this morning and was unable to walk 10-ft so her daughters brought her to the ER.  One daughter is currently living with her and has been caring for her since mid-April.    ED Course: Her vital signs were notable for mild hypertension, respirations in the mid 20s although her oxygen saturation appeared to be 95 - 100% on room air.  Her chest x-ray demonstrated bilateral pulmonary edema with mild pleural effusions and her BNP was 1032. Previous BNP in November 2016 was 55. Her point-of-care troponin was negative and her EKG demonstrated normal sinus rhythm without T-wave inversions or ST segment changes. She was given  Lasix 120 g IV once and is being admitted for acute on chronic diastolic heart failure.  Review of Systems:  General:  Denies fevers, chills, had some weight loss over last few months followed by rapid weight gain over the last two weeks HEENT:  Denies changes to hearing and vision, rhinorrhea, sinus congestion, sore throat CV:  Denies chest pain and palpitations, positive lower extremity edema.  PULM:  Positive SOB, wheezing, cough.   GI:  Denies nausea, vomiting, constipation, diarrhea.  She has had some intermittent lower abdominal pains being treated with PPI by Dr. Eulas Post GU:  Denies dysuria, frequency, urgency ENDO:  Denies polyuria, polydipsia. HEME:  Denies hematemesis, blood in stools, melena, abnormal bruising or bleeding.  LYMPH:  Denies lymphadenopathy.   MSK:  Denies arthralgias, myalgias.   DERM:  Denies skin rash or ulcer.   NEURO:  Denies focal numbness, weakness, slurred speech, confusion, facial droop.  PSYCH:  Has anxiety and denies depression.    Past Medical History  Diagnosis Date  . Hypertension   . Hypercholesteremia   . Arthritis   . Heart murmur     long time ago--no problems  . Chronic kidney disease     had some renal insufficiency.  Sees Dr. Moshe Cipro who took her off benicar in 2013.               Past Surgical History  Procedure Laterality Date  . Joint replacement      both knees  . Failed knee surgery  on the right x 2  . Cataract extraction w/phaco Right 03/20/2014    Procedure: CATARACT EXTRACTION PHACO AND INTRAOCULAR LENS PLACEMENT (IOC);  Surgeon: [name], MD;  Location: [facility];  Service: Ophthalmology;  Laterality: Right;     reports that she has never smoked. She does not have any smokeless tobacco history on file. She reports that she drinks alcohol. She reports that she does not use illicit drugs.  Allergies  Allergen Reactions  . Naprosyn [Naproxen] Itching    Hot burning feeling    Family History  Problem Relation  Age of Onset  . Diabetes Mellitus II Daughter   . Hypertension Daughter     Prior to Admission medications   Medication Sig Start Date End Date Taking? Authorizing Provider  ALPRAZolam Duanne Moron) 0.5 MG tablet Take 0.5 mg by mouth 3 (three) times daily as needed. For anxiety    Historical Provider, MD  amLODipine (NORVASC) 10 MG tablet Take 10 mg by mouth daily.    Historical Provider, MD  apixaban (ELIQUIS) 5 MG TABS tablet Take 10 mg twice daily for 7 days then 5 mg daily until you see your physician. Patient not taking: Reported on 07/29/2015 12/06/14   Elnora Morrison, MD  cilostazol (PLETAL) 100 MG tablet Take 100 mg by mouth 3 (three) times daily.  09/03/14   Historical Provider, MD  Elastic Bandages & Supports (MEDICAL COMPRESSION STOCKINGS) MISC Size large, knee high 07/23/15   Orpah Greek, MD  ferrous sulfate 325 (65 FE) MG tablet Take 325 mg by mouth daily with breakfast.    Historical Provider, MD  furosemide (LASIX) 40 MG tablet Take 80 mg by mouth 2 (two) times daily.     Historical Provider, MD  geriatric multivitamins-minerals (ELDERTONIC/GEVRABON) ELIX Take 15 mLs by mouth 2 (two) times daily. 07/28/15   Historical Provider, MD  HYDROcodone-acetaminophen (NORCO) 10-325 MG per tablet Take 1 tablet by mouth every 6 (six) hours as needed for severe pain.  10/02/14   Historical Provider, MD  HYDROcodone-acetaminophen (NORCO/VICODIN) 5-325 MG tablet Take 1 tablet by mouth every 4 (four) hours as needed. 07/29/15   Olivia Canter Sam, PA-C  loperamide (IMODIUM) 2 MG capsule Take 1 capsule (2 mg total) by mouth as needed for diarrhea or loose stools. Patient not taking: Reported on 07/23/2015 11/24/14   Jonetta Osgood, MD  metoprolol tartrate (LOPRESSOR) 25 MG tablet Take 1 tablet (25 mg total) by mouth 2 (two) times daily. Patient not taking: Reported on 07/23/2015 11/24/14   Jonetta Osgood, MD  Multiple Vitamin (MULTIVITAMIN WITH MINERALS) TABS Take 1 tablet by mouth daily.     Historical Provider, MD  omeprazole (PRILOSEC) 20 MG capsule Take 20 mg by mouth 2 (two) times daily before a meal.    Historical Provider, MD  predniSONE (DELTASONE) 50 MG tablet Take 1 tablet (50 mg total) by mouth daily. 07/29/15   Olivia Canter Sam, PA-C  saccharomyces boulardii (FLORASTOR) 250 MG capsule Take 1 capsule (250 mg total) by mouth 2 (two) times daily. Patient not taking: Reported on 07/23/2015 11/24/14   Jonetta Osgood, MD    Physical Exam: Filed Vitals:   01/01/16 1715 01/01/16 1730 01/01/16 1745 01/01/16 1800  BP: 167/68 172/63 167/55 154/58  Pulse: 59 57 56 56  Temp:      Resp: 19 26 21 23   Height:      Weight:      SpO2: 98% 99% 98% 98%      Constitutional: NAD, calm,  comfortable Filed Vitals:   01/01/16 1715 01/01/16 1730 01/01/16 1745 01/01/16 1800  BP: 167/68 172/63 167/55 154/58  Pulse: 59 57 56 56  Temp:      Resp: 19 26 21 23   Height:      Weight:      SpO2: 98% 99% 98% 98%   Eyes: PERRL, lids and conjunctivae normal ENMT: Mucous membranes are moist. Posterior pharynx clear of any exudate or lesions.Normal dentition.  Neck: normal, supple, no masses, no thyromegaly Respiratory: clear to auscultation bilaterally, no wheezing, no crackles. Normal respiratory effort. No accessory muscle use.  Cardiovascular: Regular rate and rhythm, no murmurs / rubs / gallops. No extremity edema. 2+ pedal pulses. No carotid bruits.  Abdomen: no tenderness, no masses palpated. No hepatosplenomegaly. Bowel sounds positive.  Musculoskeletal: no clubbing / cyanosis. No joint deformity upper and lower extremities. Good ROM, no contractures. Normal muscle tone.  Skin: no rashes, lesions, ulcers. No induration Neurologic: CN 2-12 grossly intact. Sensation intact, DTR normal. Strength 5/5 in all 4.  Psychiatric: Normal judgment and insight. Alert and oriented x 3. Normal mood.   Labs on Admission: I have personally reviewed following labs and imaging studies  CBC:  Recent  Labs Lab 01/01/16 1550  WBC 7.6  HGB 9.9*  HCT 32.3*  MCV 81.8  PLT 99991111   Basic Metabolic Panel:  Recent Labs Lab 01/01/16 1550  NA 134*  K 4.2  CL 102  CO2 17*  GLUCOSE 166*  BUN 70*  CREATININE 1.17*  CALCIUM 8.8*   GFR: Estimated Creatinine Clearance: 43.8 mL/min (by C-G formula based on Cr of 1.17). Liver Function Tests:  Recent Labs Lab 01/01/16 1550  AST 31  ALT 31  ALKPHOS 56  BILITOT 0.3  PROT 6.5  ALBUMIN 3.4*   No results for input(s): LIPASE, AMYLASE in the last 168 hours. No results for input(s): AMMONIA in the last 168 hours. Coagulation Profile: No results for input(s): INR, PROTIME in the last 168 hours. Cardiac Enzymes: No results for input(s): CKTOTAL, CKMB, CKMBINDEX, TROPONINI in the last 168 hours. BNP (last 3 results) No results for input(s): PROBNP in the last 8760 hours. HbA1C: No results for input(s): HGBA1C in the last 72 hours. CBG: No results for input(s): GLUCAP in the last 168 hours. Lipid Profile: No results for input(s): CHOL, HDL, LDLCALC, TRIG, CHOLHDL, LDLDIRECT in the last 72 hours. Thyroid Function Tests: No results for input(s): TSH, T4TOTAL, FREET4, T3FREE, THYROIDAB in the last 72 hours. Anemia Panel: No results for input(s): VITAMINB12, FOLATE, FERRITIN, TIBC, IRON, RETICCTPCT in the last 72 hours. Urine analysis:    Component Value Date/Time   COLORURINE STRAW* 07/23/2015 0351   APPEARANCEUR CLEAR 07/23/2015 0351   LABSPEC 1.014 07/23/2015 0351   PHURINE 5.5 07/23/2015 0351   GLUCOSEU NEGATIVE 07/23/2015 0351   HGBUR TRACE* 07/23/2015 0351   BILIRUBINUR NEGATIVE 07/23/2015 0351   KETONESUR NEGATIVE 07/23/2015 0351   PROTEINUR NEGATIVE 07/23/2015 0351   UROBILINOGEN 0.2 11/21/2014 2230   NITRITE NEGATIVE 07/23/2015 0351   LEUKOCYTESUR NEGATIVE 07/23/2015 0351   Sepsis Labs: @LABRCNTIP (procalcitonin:4,lacticidven:4) )No results found for this or any previous visit (from the past 240 hour(s)).    Radiological Exams on Admission: Dg Chest 2 View  01/01/2016  CLINICAL DATA:  Shortness of breath. EXAM: CHEST  2 VIEW COMPARISON:  July 23, 2015. FINDINGS: Stable cardiomediastinal silhouette. No pneumothorax is noted. Interval placement of bilateral interstitial densities concerning for pulmonary edema with associated mild bilateral pleural effusions. Bony thorax is unremarkable.  IMPRESSION: Interval development of bilateral pulmonary edema with associated mild pleural effusions. Electronically Signed   By: Marijo Conception, M.D.   On: 01/01/2016 16:17    EKG: Independently reviewed. NSR  Assessment/Plan Active Problems:   Dyspnea   Acute diastolic heart failure (HCC)   DVT (deep venous thrombosis) (HCC)   Acute on chronic diastolic CHF exacerbation.   -  Telemetry -  TSH -  Cycle troponins   -  Daily weights and strict I/O -  ECHO  -  No ACEI due to CKD and hx of hyperkalemia -  continue beta blocker -  Lasix 80 mg IV BID -  2 gm sodium diet with 1.2L fluid restriction -  Nursing staff to provide education about CHF -  Nutrition consultation  Intermittent abdominal pain may be due to bloating from heart failure or due to urinary retention or even atypical anginal symptoms -  Insert foley -  Diuresis as above -  Cycle troponins -  Check UA with urine culture  Essential hypertension, blood pressure mildly elevated -  Continue Norvasc, metoprolol 50 mg twice a day  GERD, stable, continue PPI  PVD with claudication -  Continue pletal  Anxiety, stable, continue xanax prn  Normocytic anemia, likely due to renal disease and iron deficiency -  Iron studies, B12, folate -  TSH -  Occult stool -  Repeat hgb in AM -  Continue iron supplementation  CKD stage IV, baseline creatinine between 2 and 3.  Creatinine seems low compared to baseline.  Elevated BUN likely due to steroids.  Chronic acidosis -  Repeat BMP in AM -  Hold off on sodium bicarb due to heart failure  exac -  If creatinine rises dramatically, may need nephrology consultation  Gout, stable, continue allopurinol  Hx of DVT due to hospitalization, no longer on A/C.    DVT prophylaxis: heparin  Code Status: DNR (would be okay with trial of bipap for dyspnea) Family Communication: patient and her two daughters [name] and Gibson  Disposition Plan:  Home pending diuresis/improvement in dyspnea, likely in several days Consults called: None  Admission status: observation telemetry   Janece Canterbury MD [facility] Pager [phone]  If 7PM-7AM, please contact night-coverage [email or web address] Password [email or web address]  01/01/2016, 7:23 PM

## 2016-01-01 NOTE — ED Notes (Signed)
Patient here with increasing shortness of breath x 1 day. History of CHF and takes 80 of lasix daily. Denies CP. tachypneic on arrival

## 2016-01-02 ENCOUNTER — Ambulatory Visit (HOSPITAL_BASED_OUTPATIENT_CLINIC_OR_DEPARTMENT_OTHER): Payer: Medicare HMO

## 2016-01-02 DIAGNOSIS — I5031 Acute diastolic (congestive) heart failure: Secondary | ICD-10-CM | POA: Diagnosis not present

## 2016-01-02 DIAGNOSIS — I509 Heart failure, unspecified: Secondary | ICD-10-CM | POA: Diagnosis not present

## 2016-01-02 DIAGNOSIS — D631 Anemia in chronic kidney disease: Secondary | ICD-10-CM | POA: Diagnosis not present

## 2016-01-02 DIAGNOSIS — D509 Iron deficiency anemia, unspecified: Secondary | ICD-10-CM

## 2016-01-02 DIAGNOSIS — I10 Essential (primary) hypertension: Secondary | ICD-10-CM | POA: Diagnosis not present

## 2016-01-02 DIAGNOSIS — N184 Chronic kidney disease, stage 4 (severe): Secondary | ICD-10-CM | POA: Diagnosis not present

## 2016-01-02 LAB — IRON AND TIBC
Iron: 24 ug/dL — ABNORMAL LOW (ref 28–170)
Saturation Ratios: 10 % — ABNORMAL LOW (ref 10.4–31.8)
TIBC: 252 ug/dL (ref 250–450)
UIBC: 228 ug/dL

## 2016-01-02 LAB — FOLATE: Folate: 31.6 ng/mL (ref >5.9)

## 2016-01-02 LAB — BASIC METABOLIC PANEL
Anion gap: 15 (ref 5–15)
BUN: 72 mg/dL — AB (ref 6–20)
CALCIUM: 8.6 mg/dL — AB (ref 8.9–10.3)
CO2: 19 mmol/L — ABNORMAL LOW (ref 22–32)
CREATININE: 2.9 mg/dL — AB (ref 0.44–1.00)
Chloride: 103 mmol/L (ref 101–111)
GFR calc Af Amer: 16 mL/min — ABNORMAL LOW (ref >60)
GFR, EST NON AFRICAN AMERICAN: 14 mL/min — AB (ref >60)
GLUCOSE: 133 mg/dL — AB (ref 65–99)
POTASSIUM: 4.1 mmol/L (ref 3.5–5.1)
Sodium: 137 mmol/L (ref 135–145)

## 2016-01-02 LAB — FERRITIN: Ferritin: 113 ng/mL (ref 11–307)

## 2016-01-02 LAB — ECHOCARDIOGRAM COMPLETE
HEIGHTINCHES: 65 in
Weight: 3753.6 oz

## 2016-01-02 LAB — TROPONIN I
TROPONIN I: 0.06 ng/mL — AB (ref <0.031)
TROPONIN I: 0.05 ng/mL — AB (ref <0.031)
Troponin I: 0.07 ng/mL — ABNORMAL HIGH (ref <0.031)
Troponin I: 0.04 ng/mL — ABNORMAL HIGH (ref <0.031)

## 2016-01-02 LAB — VITAMIN B12: VITAMIN B 12: 870 pg/mL (ref 180–914)

## 2016-01-02 MED ORDER — CETYLPYRIDINIUM CHLORIDE 0.05 % MT LIQD
7.0000 mL | Freq: Two times a day (BID) | OROMUCOSAL | Status: DC
  Administered 2016-01-02 – 2016-01-07 (×9): 7 mL via OROMUCOSAL

## 2016-01-02 MED ORDER — SENNA 8.6 MG PO TABS
2.0000 | ORAL_TABLET | Freq: Every day | ORAL | Status: DC
  Administered 2016-01-02: 17.2 mg via ORAL
  Filled 2016-01-02 (×4): qty 2

## 2016-01-02 MED ORDER — DOCUSATE SODIUM 100 MG PO CAPS
100.0000 mg | ORAL_CAPSULE | Freq: Two times a day (BID) | ORAL | Status: DC
Start: 2016-01-02 — End: 2016-01-07
  Administered 2016-01-02 – 2016-01-06 (×4): 100 mg via ORAL
  Filled 2016-01-02 (×7): qty 1

## 2016-01-02 MED ORDER — ENSURE ENLIVE PO LIQD
237.0000 mL | Freq: Three times a day (TID) | ORAL | Status: DC
  Administered 2016-01-02 – 2016-01-04 (×6): 237 mL via ORAL

## 2016-01-02 MED ORDER — ADULT MULTIVITAMIN W/MINERALS CH
1.0000 | ORAL_TABLET | Freq: Every day | ORAL | Status: DC
  Administered 2016-01-02 – 2016-01-07 (×6): 1 via ORAL
  Filled 2016-01-02 (×7): qty 1

## 2016-01-02 MED ORDER — POLYETHYLENE GLYCOL 3350 17 G PO PACK
17.0000 g | PACK | Freq: Every day | ORAL | Status: DC
  Administered 2016-01-02 – 2016-01-03 (×2): 17 g via ORAL
  Filled 2016-01-02 (×4): qty 1

## 2016-01-02 MED ORDER — BISACODYL 5 MG PO TBEC: 5.0000 mg | DELAYED_RELEASE_TABLET | Freq: Every day | ORAL | Status: DC | PRN

## 2016-01-02 NOTE — Progress Notes (Signed)
1830 breathing easier . Disposition more awake and alert .seen by physical therapist

## 2016-01-02 NOTE — Progress Notes (Signed)
*  PRELIMINARY RESULTS* Echocardiogram 2D Echocardiogram has been performed.  [name] 01/02/2016, 12:21 PM

## 2016-01-02 NOTE — Care Management Obs Status (Signed)
[facility] NOTIFICATION   Patient Details  Name: [name] MRN: [id number] Date of Birth: 30-Sep-1928   Medicare Observation Status Notification Given:  Yes    Royston Bake, RN 01/02/2016, 3:17 PM

## 2016-01-02 NOTE — Progress Notes (Addendum)
Initial Nutrition Assessment  DOCUMENTATION CODES:   Obesity unspecified  INTERVENTION:  Provide Ensure Enlive po TID after meals as needed, each supplement provides 350 kcal and 20 grams of protein  Provide Multivitamin with minerals daily  NUTRITION DIAGNOSIS:   Inadequate oral intake related to chronic illness, poor appetite as evidenced by meal completion < 25%, per patient/family report.   GOAL:   Patient will meet greater than or equal to 90% of their needs   MONITOR:   PO intake, Supplement acceptance, Labs, Weight trends, Skin, I & O's  REASON FOR ASSESSMENT:   Malnutrition Screening Tool, Consult Assessment of nutrition requirement/status  ASSESSMENT:   82 y.o. female with medical history significant of hypertension, hyperlipidemia, chronic kidney disease stage IV with a baseline creatinine of approximately 2-3, chronic diastolic heart failure, and posthospitalization DVT s/p 3 months of Eliquis who presents with shortness of breath.   Per family at bedside, pt is too short of breath to cary on a conversation at this time. Nutrition history obtained from family. They report that pt only ate a few bites of oatmeal for breakfast. They states that patient has had a poor appetite for the past month and instead of eating 3 regular meals she has been eating mostly soup or similar foods. Per H&P pt lost down to 208 lbs, but regained fluid weight. RD emphasized the importance of nutrition and pt is agreeable to receiving Ensure after meals to use as needed to supplement poor meal completion.  Per nutrition screening, pt followed a cardiac, low sodium, low cholesterol diet PTA.  Labs: high BUN and creatinine, low calcium, low GFR, low hemoglobin  Diet Order:  Diet Heart Room service appropriate?: Yes; Fluid consistency:: Thin; Fluid restriction:: 1200 mL Fluid  Skin:  Reviewed, no issues  Last BM:  unknown  Height:   Ht Readings from Last 1 Encounters:  01/01/16 5\' 5"   (1.651 m)    Weight:   Wt Readings from Last 1 Encounters:  01/02/16 234 lb 9.6 oz (106.414 kg)    Ideal Body Weight:  56.8 kg  BMI:  Body mass index is 39.04 kg/(m^2).  Estimated Nutritional Needs:   Kcal:  1700-1900  Protein:  80-100 grams  Fluid:  1.7-1.9 L/day  EDUCATION NEEDS:   No education needs identified at this time  [place], LDN Inpatient Clinical Dietitian Pager: [phone] After Hours Pager: [phone]

## 2016-01-02 NOTE — Evaluation (Signed)
Physical Therapy Evaluation Patient Details Name: [name] MRN: [id number] DOB: 15-Mar-1929 Today's Date: 01/02/2016   History of Present Illness  Patient is an 82 yo female admitted 01/01/16 with SOB.  Patient with CHF and intermittent abdominal pain.   PMH:  HTN, HLD, CKD, CHF, DVT, PVD with claudication, Bil TKA (wears knee braces), anemia    Clinical Impression  Patient presents with problems listed below.  Will benefit from acute PT to maximize functional mobility prior to discharge.  Recommend SNF at d/c for continued therapy with goal to return home.    Follow Up Recommendations SNF;Supervision/Assistance - 24 hour    Equipment Recommendations  Wheelchair (measurements PT);Wheelchair cushion (measurements PT)    Recommendations for Other Services       Precautions / Restrictions Precautions Precautions: Fall Precaution Comments: On O2 Required Braces or Orthoses: Other Brace/Splint Other Brace/Splint: Patient wears knee braces - will ask family to bring them to hospital Restrictions Weight Bearing Restrictions: No      Mobility  Bed Mobility Overal bed mobility: Needs Assistance Bed Mobility: Supine to Sit;Sit to Supine     Supine to sit: Mod assist Sit to supine: Mod assist   General bed mobility comments: Verbal cues for technique.  Use of bed rail.  Assist to move LE's off of bed and scoot hips to EOB in sitting.  Once upright, patient able to maintain sitting balance.  Assist to scoot back onto bed and bring LE's onto bed to return to supine.  Patient with dyspnea 3/4 with mobility on O2  Transfers                 General transfer comment: Unable today  Ambulation/Gait                Stairs            Wheelchair Mobility    Modified Rankin (Stroke Patients Only)       Balance Overall balance assessment: Needs assistance Sitting-balance support: Single extremity supported;Feet supported Sitting balance-Leahy Scale: Fair                                        Pertinent Vitals/Pain Pain Assessment: 0-10 Pain Score: 6  Pain Location: Back Pain Descriptors / Indicators: Aching Pain Intervention(s): Limited activity within patient's tolerance;Monitored during session;Repositioned    Home Living Family/patient expects to be discharged to:: Private residence Living Arrangements: Other relatives (Granddaughter) Available Help at Discharge: Family;Available PRN/intermittently (Granddaughter works/school; daughters work) Type of Home: House Home Access: Stairs to enter   Technical brewer of Steps: 2 Home Layout: One level Home Equipment: Environmental consultant - 2 wheels;Cane - single point;Shower seat      Prior Function Level of Independence: Independent with assistive device(s);Needs assistance   Gait / Transfers Assistance Needed: Uses cane in house, and walker outside of home  ADL's / Homemaking Assistance Needed: Granddaughter and daughters provide assist with bathing, meal prep, housekeeping.        Hand Dominance        Extremity/Trunk Assessment   Upper Extremity Assessment: Generalized weakness           Lower Extremity Assessment: Generalized weakness (Uses bil knee braces for gait)         Communication   Communication: No difficulties  Cognition Arousal/Alertness: Awake/alert Behavior During Therapy: WFL for tasks assessed/performed Overall Cognitive Status: Within Functional Limits for tasks  assessed                      General Comments      Exercises        Assessment/Plan    PT Assessment Patient needs continued PT services  PT Diagnosis Difficulty walking;Generalized weakness;Acute pain   PT Problem List Decreased strength;Decreased activity tolerance;Decreased balance;Decreased mobility;Cardiopulmonary status limiting activity;Obesity;Pain  PT Treatment Interventions DME instruction;Gait training;Functional mobility training;Therapeutic  activities;Therapeutic exercise;Patient/family education   PT Goals (Current goals can be found in the Care Plan section) Acute Rehab PT Goals Patient Stated Goal: To get stronger PT Goal Formulation: With patient Time For Goal Achievement: 01/16/16 Potential to Achieve Goals: Good    Frequency Min 3X/week   Barriers to discharge Decreased caregiver support Patient is home alone during day.    Co-evaluation               End of Session Equipment Utilized During Treatment: Oxygen Activity Tolerance: Patient limited by fatigue;Patient limited by pain (Limited by dyspnea) Patient left: in bed;with call bell/phone within reach;with nursing/sitter in room Nurse Communication: Mobility status    Functional Assessment Tool Used: Clinical judgement Functional Limitation: Mobility: Walking and moving around Mobility: Walking and Moving Around Current Status [id number]): At least 60 percent but less than 80 percent impaired, limited or restricted Mobility: Walking and Moving Around Goal Status [phone]): At least 1 percent but less than 20 percent impaired, limited or restricted    Time: [id number] PT Time Calculation (min) (ACUTE ONLY): 14 min   Charges:   PT Evaluation $PT Eval Moderate Complexity: 1 Procedure     PT G Codes:   PT G-Codes **NOT FOR INPATIENT CLASS** Functional Assessment Tool Used: Clinical judgement Functional Limitation: Mobility: Walking and moving around Mobility: Walking and Moving Around Current Status [id number]): At least 60 percent but less than 80 percent impaired, limited or restricted Mobility: Walking and Moving Around Goal Status [phone]): At least 1 percent but less than 20 percent impaired, limited or restricted    Despina Pole 01/02/2016, 7:08 PM Carita Pian. Sanjuana Kava, [place] Pager [phone]

## 2016-01-02 NOTE — Progress Notes (Signed)
PROGRESS NOTE  [name]  [id number] DOB: 1929/04/13 DOA: 01/01/2016 PCP: Antonietta Jewel, MD Outpatient Specialists:  Dr. Moshe Cipro, Nephrology   Brief Narrative:   [name] is a 82 y.o. female with medical history significant of hypertension, hyperlipidemia, chronic kidney disease stage IV with a baseline creatinine of approximately 2-3, chronic diastolic heart failure, and posthospitalization DVT s/p 3 months of Eliquis who presented with shortness of breath, 30 pound weight gain, increased lower extremity edema and abdominal distention. Her weight increased from 208-lbs about two weeks prior to admission to 239-lbs. She saw her PCP 5 days prior to presentation for dyspnea and was given a prescription for antibiotics and prednisone which did not help. She was very SOB morning and was unable to walk 10-ft so her daughters brought her to the ER where chest x-ray demonstrated bilateral pulmonary edema with mild pleural effusions and her BNP was 1032. Previous BNP in November 2016 was 55. Her point-of-care troponin was negative and her EKG demonstrated normal sinus rhythm without T-wave inversions or ST segment changes. She was given Lasix 120 g IV once and was admitted for acute on chronic diastolic heart failure.  Assessment & Plan:   Principal Problem:   Acute diastolic heart failure (HCC) Active Problems:   Metabolic acidosis   Hypertension, uncontrolled   Dyspnea   DVT (deep venous thrombosis) (HCC)   CKD (chronic kidney disease), stage IV (HCC)   Anemia of renal disease   Iron deficiency anemia  Acute on chronic diastolic CHF exacerbation.  - Telemetry:  Sinus bradycardia in the 40s - TSH 2.008 - Cycle troponins:  Mildly elevated up to 0.07 today -  Cycle additional troponin to ensure stable or trending down - Daily weights and strict I/O:  -1.8 L - ECHO pending - No ACEI due to CKD and hx of hyperkalemia - continue beta blocker - Continue Lasix  80 mg IV BID - 2 gm sodium diet with 1.2L fluid restriction - Nursing staff to provide education about CHF - Nutrition consultation  Intermittent abdominal pain may be due to bloating from heart failure or due to urinary retention or even atypical anginal symptoms - continue foley - Diuresis as above - add stool softeners -  If no BM with bowel regimen, consider CT sans contrast to eval for kidney stones - UA negative  Essential hypertension, blood pressure mildly elevated - Continue Norvasc, metoprolol 50 mg twice a day  GERD, stable, continue PPI  PVD with claudication - Continue pletal  Anxiety, stable, continue xanax prn  Normocytic anemia, likely due to renal disease and iron deficiency - Iron studies, B12, folate suggest mild iron deficiency - TSH wnl - Occult stool - Repeat hgb in AM - Continue iron supplementation  CKD stage IV, baseline creatinine between 2 and 3. Creatinine at baseline. Chronic acidosis - Repeat BMP in AM - Hold off on sodium bicarb due to heart failure exac  Gout, stable, continue allopurinol  Hx of DVT due to hospitalization, no longer on A/C.   DVT prophylaxis: heparin  Code Status: DNR (would be okay with trial of bipap for dyspnea) Family Communication: patient and her two daughters [name] and [place]  Disposition Plan: Home pending diuresis/improvement in dyspnea, likely in several days  Consultants:   None  Procedures:  ECHO  Antimicrobials:   none    Subjective: Continues to feel dyspneic despite some diuresis overnight. Denies chest pains but continues to have some lower abdominal pain. She has not had a  bowel movement. She asks for stool softeners. Still has poor appetite.  Objective: Filed Vitals:   01/01/16 2315 01/02/16 0500 01/02/16 0817 01/02/16 0949  BP: 163/52 179/50 146/56 180/55  Pulse: 64 63 67 71  Temp: 97.8 F (36.6 C) 98.5 F (36.9 C) 98.7 F (37.1 C)   TempSrc: Oral Oral Oral     Resp: 22 22 22    Height:      Weight:  106.414 kg (234 lb 9.6 oz)    SpO2: 95% 96% 95%     Intake/Output Summary (Last 24 hours) at 01/02/16 1615 Last data filed at 01/02/16 1038  Gross per 24 hour  Intake    382 ml  Output   2925 ml  Net  -2543 ml   Filed Weights   01/01/16 1544 01/01/16 1956 01/02/16 0500  Weight: 108.41 kg (239 lb) 106.641 kg (235 lb 1.6 oz) 106.414 kg (234 lb 9.6 oz)    Examination:  General exam:  Adult Female. Mild to moderate respiratory distress.  SCM retractions, unable to complete a complete sentence before positive arrest. Breathless, to get neck  HEENT:  NCAT, MMM Respiratory system:  Moderate pitched full expiratory wheeze bilaterally, faint rales at the bases, no rhonchi  Cardiovascular system: S1 & S2 heard, RRR. No JVD, murmurs, rubs, gallops or clicks.  Warm extremities Gastrointestinal system: Abdomen is moderately distended, tender to palpation in the umbilical area and suprapubic area without rebound or guarding, NABS Skin: No rashes, lesions or ulcers MSK:  Normal tone and bulk, 1+ bilateral lower extremity edema Neuro:  Grossly intact Psychiatry: Judgement and insight appear normal. Mood & affect appropriate.     Data Reviewed: I have personally reviewed following labs and imaging studies  CBC:  Recent Labs Lab 01/01/16 1550  WBC 7.6  HGB 9.9*  HCT 32.3*  MCV 81.8  PLT [id number]   Basic Metabolic Panel:  Recent Labs Lab 01/01/16 1550 01/02/16 0136  NA 134* 137  K 4.2 4.1  CL 102 103  CO2 17* 19*  GLUCOSE 166* 133*  BUN 70* 72*  CREATININE 1.17* 2.90*  CALCIUM 8.8* 8.6*   GFR: Estimated Creatinine Clearance: 17.5 mL/min (by C-G formula based on Cr of 2.9). Liver Function Tests:  Recent Labs Lab 01/01/16 1550  AST 31  ALT 31  ALKPHOS 56  BILITOT 0.3  PROT 6.5  ALBUMIN 3.4*   No results for input(s): LIPASE, AMYLASE in the last 168 hours. No results for input(s): AMMONIA in the last 168 hours. Coagulation  Profile: No results for input(s): INR, PROTIME in the last 168 hours. Cardiac Enzymes:  Recent Labs Lab 01/01/16 2045 01/02/16 0136 01/02/16 0520  TROPONINI 0.04* 0.06* 0.07*   BNP (last 3 results) No results for input(s): PROBNP in the last 8760 hours. HbA1C: No results for input(s): HGBA1C in the last 72 hours. CBG: No results for input(s): GLUCAP in the last 168 hours. Lipid Profile: No results for input(s): CHOL, HDL, LDLCALC, TRIG, CHOLHDL, LDLDIRECT in the last 72 hours. Thyroid Function Tests:  Recent Labs  01/01/16 2045  TSH 2.008   Anemia Panel:  Recent Labs  01/02/16 0520  VITAMINB12 870  FOLATE 31.6  FERRITIN 113  TIBC 252  IRON 24*   Urine analysis:    Component Value Date/Time   COLORURINE YELLOW 01/01/2016 2100   APPEARANCEUR CLEAR 01/01/2016 2100   LABSPEC 1.008 01/01/2016 2100   PHURINE 5.5 01/01/2016 2100   GLUCOSEU NEGATIVE 01/01/2016 2100   [place] NEGATIVE 01/01/2016 2100  [place] NEGATIVE 01/01/2016 2100   [place] NEGATIVE 01/01/2016 2100   PROTEINUR NEGATIVE 01/01/2016 2100   UROBILINOGEN 0.2 11/21/2014 2230   NITRITE NEGATIVE 01/01/2016 2100   LEUKOCYTESUR NEGATIVE 01/01/2016 2100   Sepsis Labs: @LABRCNTIP (procalcitonin:4,lacticidven:4)  ) Recent Results (from the past 240 hour(s))  Culture, Urine     Status: None (Preliminary result)   Collection Time: 01/01/16  9:01 PM  Result Value Ref Range Status   Specimen Description URINE, RANDOM  Final   Special Requests NONE  Final   Culture TOO YOUNG TO READ  Final   Report Status PENDING  Incomplete      Radiology Studies: Dg Chest 2 View  01/01/2016  CLINICAL DATA:  Shortness of breath. EXAM: CHEST  2 VIEW COMPARISON:  July 23, 2015. FINDINGS: Stable cardiomediastinal silhouette. No pneumothorax is noted. Interval placement of bilateral interstitial densities concerning for pulmonary edema with associated mild bilateral pleural effusions. Bony thorax is unremarkable.  IMPRESSION: Interval development of bilateral pulmonary edema with associated mild pleural effusions. Electronically Signed   By: Marijo Conception, M.D.   On: 01/01/2016 16:17     Scheduled Meds: . allopurinol  100 mg Oral BID  . amLODipine  10 mg Oral Daily  . cilostazol  100 mg Oral BID  . docusate sodium  100 mg Oral BID  . feeding supplement (ENSURE ENLIVE)  237 mL Oral TID PC  . ferrous sulfate  325 mg Oral Q breakfast  . furosemide  80 mg Intravenous BID  . heparin  5,000 Units Subcutaneous Q8H  . hydrALAZINE  100 mg Oral BID  . metoprolol tartrate  50 mg Oral BID  . multivitamin with minerals  1 tablet Oral Daily  . pantoprazole  40 mg Oral Daily  . polyethylene glycol  17 g Oral Daily  . senna  2 tablet Oral QHS  . sodium chloride flush  3 mL Intravenous Q12H   Continuous Infusions:    LOS: 1 day    Time spent: 30 min    Janece Canterbury, MD [facility] Pager [phone]  If 7PM-7AM, please contact night-coverage [email or web address] Password [email or web address] 01/02/2016, 4:15 PM

## 2016-01-03 DIAGNOSIS — Z9111 Patient's noncompliance with dietary regimen: Secondary | ICD-10-CM | POA: Diagnosis not present

## 2016-01-03 DIAGNOSIS — Z961 Presence of intraocular lens: Secondary | ICD-10-CM | POA: Diagnosis present

## 2016-01-03 DIAGNOSIS — Z9841 Cataract extraction status, right eye: Secondary | ICD-10-CM | POA: Diagnosis not present

## 2016-01-03 DIAGNOSIS — R0602 Shortness of breath: Secondary | ICD-10-CM | POA: Diagnosis present

## 2016-01-03 DIAGNOSIS — Z96653 Presence of artificial knee joint, bilateral: Secondary | ICD-10-CM | POA: Diagnosis present

## 2016-01-03 DIAGNOSIS — I130 Hypertensive heart and chronic kidney disease with heart failure and stage 1 through stage 4 chronic kidney disease, or unspecified chronic kidney disease: Secondary | ICD-10-CM | POA: Diagnosis present

## 2016-01-03 DIAGNOSIS — I5033 Acute on chronic diastolic (congestive) heart failure: Secondary | ICD-10-CM | POA: Diagnosis present

## 2016-01-03 DIAGNOSIS — D631 Anemia in chronic kidney disease: Secondary | ICD-10-CM | POA: Diagnosis not present

## 2016-01-03 DIAGNOSIS — J9601 Acute respiratory failure with hypoxia: Secondary | ICD-10-CM | POA: Diagnosis not present

## 2016-01-03 DIAGNOSIS — N184 Chronic kidney disease, stage 4 (severe): Secondary | ICD-10-CM | POA: Diagnosis not present

## 2016-01-03 DIAGNOSIS — Z9842 Cataract extraction status, left eye: Secondary | ICD-10-CM | POA: Diagnosis not present

## 2016-01-03 DIAGNOSIS — K219 Gastro-esophageal reflux disease without esophagitis: Secondary | ICD-10-CM | POA: Diagnosis present

## 2016-01-03 DIAGNOSIS — E872 Acidosis: Secondary | ICD-10-CM | POA: Diagnosis present

## 2016-01-03 DIAGNOSIS — Z7952 Long term (current) use of systemic steroids: Secondary | ICD-10-CM | POA: Diagnosis not present

## 2016-01-03 DIAGNOSIS — Z7901 Long term (current) use of anticoagulants: Secondary | ICD-10-CM | POA: Diagnosis not present

## 2016-01-03 DIAGNOSIS — I70219 Atherosclerosis of native arteries of extremities with intermittent claudication, unspecified extremity: Secondary | ICD-10-CM | POA: Diagnosis present

## 2016-01-03 DIAGNOSIS — Z66 Do not resuscitate: Secondary | ICD-10-CM | POA: Diagnosis present

## 2016-01-03 DIAGNOSIS — F419 Anxiety disorder, unspecified: Secondary | ICD-10-CM | POA: Diagnosis present

## 2016-01-03 DIAGNOSIS — I82402 Acute embolism and thrombosis of unspecified deep veins of left lower extremity: Secondary | ICD-10-CM | POA: Diagnosis not present

## 2016-01-03 DIAGNOSIS — I10 Essential (primary) hypertension: Secondary | ICD-10-CM | POA: Diagnosis not present

## 2016-01-03 DIAGNOSIS — I5031 Acute diastolic (congestive) heart failure: Secondary | ICD-10-CM | POA: Diagnosis not present

## 2016-01-03 DIAGNOSIS — D509 Iron deficiency anemia, unspecified: Secondary | ICD-10-CM | POA: Diagnosis not present

## 2016-01-03 LAB — BASIC METABOLIC PANEL
Anion gap: 13 (ref 5–15)
BUN: 74 mg/dL — ABNORMAL HIGH (ref 6–20)
CALCIUM: 8.3 mg/dL — AB (ref 8.9–10.3)
CO2: 25 mmol/L (ref 22–32)
CREATININE: 2.71 mg/dL — AB (ref 0.44–1.00)
Chloride: 102 mmol/L (ref 101–111)
GFR calc non Af Amer: 15 mL/min — ABNORMAL LOW (ref >60)
GFR, EST AFRICAN AMERICAN: 17 mL/min — AB (ref >60)
Glucose, Bld: 96 mg/dL (ref 65–99)
Potassium: 3.9 mmol/L (ref 3.5–5.1)
Sodium: 140 mmol/L (ref 135–145)

## 2016-01-03 LAB — URINE CULTURE

## 2016-01-03 LAB — TROPONIN I: Troponin I: 0.04 ng/mL — ABNORMAL HIGH (ref <0.031)

## 2016-01-03 NOTE — Progress Notes (Signed)
Physical Therapy Treatment Patient Details Name: [name] MRN: [id number] DOB: 15-Jul-1929 Today's Date: 01/03/2016    History of Present Illness Patient is an 82 yo female admitted 01/01/16 with SOB.  Patient with CHF and intermittent abdominal pain.   PMH:  HTN, HLD, CKD, CHF, DVT, PVD with claudication, Bil TKA (wears knee braces), anemia    PT Comments    Patient making gains with mobility and gait.  Follow Up Recommendations  SNF;Supervision/Assistance - 24 hour     Equipment Recommendations  Wheelchair (measurements PT);Wheelchair cushion (measurements PT)    Recommendations for Other Services       Precautions / Restrictions Precautions Precautions: Fall Precaution Comments: On O2 Required Braces or Orthoses: Other Brace/Splint Other Brace/Splint: Patient wears knee braces - will ask family to bring them to hospital Restrictions Weight Bearing Restrictions: No    Mobility  Bed Mobility Overal bed mobility: Needs Assistance Bed Mobility: Supine to Sit     Supine to sit: Min assist     General bed mobility comments: Verbal cues for technique.  Use of bed rail.  Increased time.  Min assist to steady as patient brings trunk to sitting position.  Transfers Overall transfer level: Needs assistance Equipment used: Rolling walker (2 wheeled) Transfers: Sit to/from Stand Sit to Stand: Min assist;+2 safety/equipment         General transfer comment: Verbal cues for hand placement.  Assist to rise to standing and for balance.  Ambulation/Gait Ambulation/Gait assistance: Min assist;+2 safety/equipment Ambulation Distance (Feet): 40 Feet Assistive device: Rolling walker (2 wheeled) Gait Pattern/deviations: Step-through pattern;Decreased stride length;Shuffle;Trunk flexed Gait velocity: decreased Gait velocity interpretation: Below normal speed for age/gender General Gait Details: Verbal cues to stand upright.  Patient with slow guarded gait.  Dyspnea 2/4 on  O2 during gait.   Stairs            Wheelchair Mobility    Modified Rankin (Stroke Patients Only)       Balance Overall balance assessment: Needs assistance Sitting-balance support: No upper extremity supported;Feet supported Sitting balance-Leahy Scale: Good     Standing balance support: Bilateral upper extremity supported Standing balance-Leahy Scale: Poor                      Cognition Arousal/Alertness: Awake/alert Behavior During Therapy: WFL for tasks assessed/performed Overall Cognitive Status: Within Functional Limits for tasks assessed                      Exercises      General Comments        Pertinent Vitals/Pain Pain Assessment: No/denies pain    Home Living                      Prior Function            PT Goals (current goals can now be found in the care plan section) Progress towards PT goals: Progressing toward goals    Frequency  Min 3X/week    PT Plan Current plan remains appropriate    Co-evaluation             End of Session Equipment Utilized During Treatment: Gait belt;Oxygen Activity Tolerance: Patient tolerated treatment well;Patient limited by fatigue Patient left: in chair;with call bell/phone within reach;with family/visitor present     Time: [id number] PT Time Calculation (min) (ACUTE ONLY): 14 min  Charges:  $Gait Training: 8-22 mins  G Codes:      Despina Pole 01/03/2016, 12:34 PM Carita Pian. Sanjuana Kava, [place] Pager [phone]

## 2016-01-03 NOTE — Progress Notes (Signed)
Patient & daughter refused bed alarm. Will continue to monitor patient.

## 2016-01-03 NOTE — Progress Notes (Addendum)
PROGRESS NOTE  [name]  [id number] DOB: Sep 21, 1928 DOA: 01/01/2016 PCP: Antonietta Jewel, MD Outpatient Specialists:  Dr. Moshe Cipro, Nephrology   Brief Narrative:   [name] is a 82 y.o. female with medical history significant of hypertension, hyperlipidemia, chronic kidney disease stage IV, chronic diastolic heart failure, and DVT no longer on A/c who presented with progressive shortness of breath over one month.  She also had a 30 pound weight gain, increased lower extremity edema and abdominal distention. Antibiotics and prednisone did not help. In the ER, chest x-ray demonstrated bilateral pulmonary edema with mild pleural effusions and her BNP was 1032.  Her point-of-care troponin was negative and her EKG demonstrated normal sinus rhythm without T-wave inversions or ST segment changes. She was given Lasix 120 g IV once and was admitted for acute on chronic diastolic heart failure.  Assessment & Plan:   Principal Problem:   Acute diastolic heart failure (HCC) Active Problems:   Metabolic acidosis   Hypertension, uncontrolled   Dyspnea   DVT (deep venous thrombosis) (HCC)   CKD (chronic kidney disease), stage IV (HCC)   Anemia of renal disease   Iron deficiency anemia  Acute on chronic diastolic CHF exacerbation.  - Telemetry:  Sinus bradycardia in the 40s - TSH 2.008 - Daily weights and strict I/O:  -2.9 L - ECHO with diastolic heart failure, preserved EF - No ACEI due to CKD and hx of hyperkalemia - continue beta blocker - Continue Lasix 80 mg IV BID - 2 gm sodium diet with 1.2L fluid restriction - Nursing staff to provide education about CHF - Nutrition consultation  Mildly elevated but flat troponins, likely due to renal disease and heart failure  -  Follow up with cardiology as outpatient  Intermittent abdominal pain may be due to bloating from heart failure or due to urinary retention or even atypical anginal symptoms - d/c foley -  Diuresis as above - add stool softeners -  If no BM with bowel regimen, consider CT sans contrast to eval for kidney stones - UA negative  Essential hypertension, blood pressure mildly elevated - Continue Norvasc, metoprolol 50 mg twice a day  GERD, stable, continue PPI  PVD with claudication - Continue pletal  Anxiety, stable, continue xanax prn  Normocytic anemia, likely due to renal disease and iron deficiency - Iron studies, B12, folate suggest mild iron deficiency - TSH wnl - Occult stool - Repeat hgb in AM - Continue iron supplementation  CKD stage IV, baseline creatinine between 2 and 3. Creatinine at baseline. Chronic acidosis - Repeat BMP in AM - Hold off on sodium bicarb due to heart failure exac  Gout, stable, continue allopurinol  Hx of DVT due to hospitalization, no longer on A/C.   DVT prophylaxis: heparin  Code Status: DNR (would be okay with trial of bipap for dyspnea) Family Communication: patient and her daughter [name] Disposition Plan: Home pending diuresis/improvement in dyspnea, likely in several days  Consultants:   None  Procedures:  ECHO  Antimicrobials:   none    Subjective: Dyspnea improved. Able to sit outside of that without shortness of breath.  Denies chest pain. Able to eat. Had a bowel movement yesterday.  Objective: Filed Vitals:   01/02/16 2118 01/03/16 0528 01/03/16 1042 01/03/16 1233  BP: 144/50 150/50 151/46 126/39  Pulse: 73 59  53  Temp: 99.1 F (37.3 C) 98.8 F (37.1 C)  98.6 F (37 C)  TempSrc: Oral Oral  Oral  Resp:  22  20  Height:      Weight:  103.738 kg (228 lb 11.2 oz)    SpO2: 97% 97%  98%    Intake/Output Summary (Last 24 hours) at 01/03/16 1612 Last data filed at 01/03/16 1425  Gross per 24 hour  Intake   1200 ml  Output   4225 ml  Net  -3025 ml   Filed Weights   01/01/16 1956 01/02/16 0500 01/03/16 0528  Weight: 106.641 kg (235 lb 1.6 oz) 106.414 kg (234 lb 9.6 oz) 103.738 kg  (228 lb 11.2 oz)    Examination:  General exam:  Adult Female. No acute distress  HEENT:  NCAT, MMM Respiratory system:   Rales to the mid back, no wheeze, no rhonchi  Cardiovascular system: S1 & S2 heard, RRR. No JVD, murmurs, rubs, gallops or clicks.  Warm extremities Gastrointestinal system: Normal active bowel sounds, soft, nondistended, nontender MSK:  Normal tone and bulk, 1+ bilateral lower extremity edema Neuro:  Grossly intact Psychiatry: Judgement and insight appear normal. Mood & affect appropriate.     Data Reviewed: I have personally reviewed following labs and imaging studies  CBC:  Recent Labs Lab 01/01/16 1550  WBC 7.6  HGB 9.9*  HCT 32.3*  MCV 81.8  PLT 99991111   Basic Metabolic Panel:  Recent Labs Lab 01/01/16 1550 01/02/16 0136 01/03/16 0440  NA 134* 137 140  K 4.2 4.1 3.9  CL 102 103 102  CO2 17* 19* 25  GLUCOSE 166* 133* 96  BUN 70* 72* 74*  CREATININE 1.17* 2.90* 2.71*  CALCIUM 8.8* 8.6* 8.3*   GFR: Estimated Creatinine Clearance: 18.5 mL/min (by C-G formula based on Cr of 2.71). Liver Function Tests:  Recent Labs Lab 01/01/16 1550  AST 31  ALT 31  ALKPHOS 56  BILITOT 0.3  PROT 6.5  ALBUMIN 3.4*   No results for input(s): LIPASE, AMYLASE in the last 168 hours. No results for input(s): AMMONIA in the last 168 hours. Coagulation Profile: No results for input(s): INR, PROTIME in the last 168 hours. Cardiac Enzymes:  Recent Labs Lab 01/02/16 0136 01/02/16 0520 01/02/16 1613 01/02/16 2148 01/03/16 0440  TROPONINI 0.06* 0.07* 0.05* 0.04* 0.04*   BNP (last 3 results) No results for input(s): PROBNP in the last 8760 hours. HbA1C: No results for input(s): HGBA1C in the last 72 hours. CBG: No results for input(s): GLUCAP in the last 168 hours. Lipid Profile: No results for input(s): CHOL, HDL, LDLCALC, TRIG, CHOLHDL, LDLDIRECT in the last 72 hours. Thyroid Function Tests:  Recent Labs  01/01/16 2045  TSH 2.008   Anemia  Panel:  Recent Labs  01/02/16 0520  VITAMINB12 870  FOLATE 31.6  FERRITIN 113  TIBC 252  IRON 24*   Urine analysis:    Component Value Date/Time   COLORURINE YELLOW 01/01/2016 2100   APPEARANCEUR CLEAR 01/01/2016 2100   LABSPEC 1.008 01/01/2016 2100   PHURINE 5.5 01/01/2016 2100   GLUCOSEU NEGATIVE 01/01/2016 2100   HGBUR NEGATIVE 01/01/2016 2100   [place] NEGATIVE 01/01/2016 2100   [place] NEGATIVE 01/01/2016 2100   PROTEINUR NEGATIVE 01/01/2016 2100   UROBILINOGEN 0.2 11/21/2014 2230   NITRITE NEGATIVE 01/01/2016 2100   LEUKOCYTESUR NEGATIVE 01/01/2016 2100   Sepsis Labs: @LABRCNTIP (procalcitonin:4,lacticidven:4)  ) Recent Results (from the past 240 hour(s))  Culture, Urine     Status: Abnormal   Collection Time: 01/01/16  9:01 PM  Result Value Ref Range Status   Specimen Description URINE, RANDOM  Final   Special Requests NONE  Final   Culture MULTIPLE SPECIES PRESENT, SUGGEST RECOLLECTION (A)  Final   Report Status 01/03/2016 FINAL  Final      Radiology Studies: Dg Chest 2 View  01/01/2016  CLINICAL DATA:  Shortness of breath. EXAM: CHEST  2 VIEW COMPARISON:  July 23, 2015. FINDINGS: Stable cardiomediastinal silhouette. No pneumothorax is noted. Interval placement of bilateral interstitial densities concerning for pulmonary edema with associated mild bilateral pleural effusions. Bony thorax is unremarkable. IMPRESSION: Interval development of bilateral pulmonary edema with associated mild pleural effusions. Electronically Signed   By: Marijo Conception, M.D.   On: 01/01/2016 16:17     Scheduled Meds: . allopurinol  100 mg Oral BID  . amLODipine  10 mg Oral Daily  . antiseptic oral rinse  7 mL Mouth Rinse BID  . cilostazol  100 mg Oral BID  . docusate sodium  100 mg Oral BID  . feeding supplement (ENSURE ENLIVE)  237 mL Oral TID PC  . ferrous sulfate  325 mg Oral Q breakfast  . furosemide  80 mg Intravenous BID  . heparin  5,000 Units Subcutaneous  Q8H  . hydrALAZINE  100 mg Oral BID  . metoprolol tartrate  50 mg Oral BID  . multivitamin with minerals  1 tablet Oral Daily  . pantoprazole  40 mg Oral Daily  . polyethylene glycol  17 g Oral Daily  . senna  2 tablet Oral QHS  . sodium chloride flush  3 mL Intravenous Q12H   Continuous Infusions:    LOS: 2 days    Time spent: 30 min    Janece Canterbury, MD [facility] Pager [phone]  If 7PM-7AM, please contact night-coverage [email or web address] Password [email or web address] 01/03/2016, 4:12 PM

## 2016-01-04 LAB — BASIC METABOLIC PANEL
ANION GAP: 10 (ref 5–15)
BUN: 65 mg/dL — AB (ref 6–20)
CHLORIDE: 103 mmol/L (ref 101–111)
CO2: 27 mmol/L (ref 22–32)
Calcium: 8.5 mg/dL — ABNORMAL LOW (ref 8.9–10.3)
Creatinine, Ser: 2.48 mg/dL — ABNORMAL HIGH (ref 0.44–1.00)
GFR calc Af Amer: 19 mL/min — ABNORMAL LOW (ref >60)
GFR calc non Af Amer: 17 mL/min — ABNORMAL LOW (ref >60)
GLUCOSE: 145 mg/dL — AB (ref 65–99)
POTASSIUM: 3.8 mmol/L (ref 3.5–5.1)
Sodium: 140 mmol/L (ref 135–145)

## 2016-01-04 MED ORDER — MAGNESIUM CHLORIDE 64 MG PO TBEC
2.0000 | DELAYED_RELEASE_TABLET | Freq: Two times a day (BID) | ORAL | Status: DC
  Administered 2016-01-04: 128 mg via ORAL
  Filled 2016-01-04 (×2): qty 2

## 2016-01-04 MED ORDER — POTASSIUM CHLORIDE CRYS ER 20 MEQ PO TBCR
40.0000 meq | EXTENDED_RELEASE_TABLET | Freq: Every day | ORAL | Status: DC
  Administered 2016-01-04: 40 meq via ORAL
  Filled 2016-01-04: qty 2

## 2016-01-04 NOTE — Progress Notes (Signed)
Occupational Therapy Evaluation Patient Details Name: [name] MRN: [id number] DOB: Jan 07, 1929 Today's Date: 01/04/2016    History of Present Illness Patient is an 82 yo female admitted 01/01/16 with SOB.  Patient with CHF and intermittent abdominal pain.   PMH:  HTN, HLD, CKD, CHF, DVT, PVD with claudication, Bil TKA (wears knee braces), anemia   Clinical Impression   Pt admitted with the above diagnoses and presents with below problem list. Pt will benefit from continued acute OT to address the below listed deficits and maximize independence with BADLs prior to d/c to venue below. PTA pt reports she was mod I with basic ADLs. Pt is currently min guard to min A for most OOB/LB ADLs; mod A for pericare. Pt limited by fatigue this session. Daughter present and involved during session. Pt able to arrange 24 hour assist at home at d/c and wants to d/c home. Will follow.      Follow Up Recommendations  Home health OT;Supervision/Assistance - 24 hour    Equipment Recommendations  None recommended by OT    Recommendations for Other Services       Precautions / Restrictions Precautions Precautions: Fall Precaution Comments: On O2; pt reports she was not on O2 PTA Required Braces or Orthoses: Other Brace/Splint Other Brace/Splint: bilateral knee braces from home Restrictions Weight Bearing Restrictions: No      Mobility Bed Mobility Overal bed mobility: Needs Assistance Bed Mobility: Supine to Sit;Sit to Supine     Supine to sit: Min guard Sit to supine: Min guard   General bed mobility comments: min guard for safety. HOB partially elevated to come to EOB. Bed rails utilized.  Transfers Overall transfer level: Needs assistance Equipment used: Rolling walker (2 wheeled) Transfers: Sit to/from Stand Sit to Stand: Min guard         General transfer comment: cues for technique with rw. min guard for safety. Stood min guard for [employer].    Balance Overall balance  assessment: Needs assistance Sitting-balance support: No upper extremity supported;Feet supported Sitting balance-Leahy Scale: Good Sitting balance - Comments: donned bil knee braces sitting EOB with setup   Standing balance support: Bilateral upper extremity supported;During functional activity Standing balance-Leahy Scale: Poor Standing balance comment: rw for balance                            ADL Overall ADL's : Needs assistance/impaired Eating/Feeding: Set up;Sitting   Grooming: Set up;Min guard;Sitting;Standing Grooming Details (indicate cue type and reason): stood min guard to wash hands Upper Body Bathing: Set up;Sitting   Lower Body Bathing: Min guard;Sit to/from stand;Minimal assistance   Upper Body Dressing : Set up;Sitting   Lower Body Dressing: Min guard;Minimal assistance;Sit to/from stand   Toilet Transfer: Min guard;Ambulation;BSC;RW Toilet Transfer Details (indicate cue type and reason): BSC utilized due to urgency. Pt able to walk distance to/from bathroom. Toileting- Clothing Manipulation and Hygiene: Moderate assistance;Sit to/from stand Toileting - Clothing Manipulation Details (indicate cue type and reason): Pt stood with rw at min guard level with daugther providing total A for posterior pericare Tub/ Shower Transfer: Tub transfer;Minimal assistance;Ambulation;3 in 1;Rolling walker Tub/Shower Transfer Details (indicate cue type and reason): clinical judgement. Educated on having someone beside her when she does shower transfers at home.  Functional mobility during ADLs: Min guard;Rolling walker General ADL Comments: Pt completed SPT to [facility] and toileting tasks as detailed above. Then completed in-room functional mobility at min guard level, decreased speed  noted but no swaying or LOB. Discussed d/c plan with pt and daughter. Pt reports she will have 24 hour care and prefers to go home at d/c. Daughter and 2 adult grandchildren present at times during  session. Daughter involved during session.      Vision     Perception     Praxis      Pertinent Vitals/Pain Pain Assessment: Faces Faces Pain Scale: Hurts little more Pain Location: back, sites of toe gout on both feet with mobility and very sensitive to touch Pain Descriptors / Indicators: Aching;Sore Pain Intervention(s): Limited activity within patient's tolerance;Monitored during session;Repositioned     Hand Dominance     Extremity/Trunk Assessment Upper Extremity Assessment Upper Extremity Assessment: Overall WFL for tasks assessed;Generalized weakness   Lower Extremity Assessment Lower Extremity Assessment: Defer to PT evaluation (bil knee braces on during OOB mobility)       Communication Communication Communication: No difficulties   Cognition Arousal/Alertness: Awake/alert Behavior During Therapy: WFL for tasks assessed/performed Overall Cognitive Status: Within Functional Limits for tasks assessed                     General Comments       Exercises       Shoulder Instructions      Home Living Family/patient expects to be discharged to:: Private residence Living Arrangements: Other relatives Available Help at Discharge: Family;Available PRN/intermittently (Granddaughter works/school; daughters work)) Type of Home: House Home Access: Stairs to enter Technical brewer of Steps: 2   Home Layout: One level     Bathroom Shower/Tub: Teacher, early years/pre: Handicapped height     Home Equipment: Environmental consultant - 2 wheels;Cane - single point;Shower seat;Bedside commode   Additional Comments: Pt reports her daughter (from [place]) is coming to stay with her for awhile at d/c so someone will be with her 24 hours.      Prior Functioning/Environment Level of Independence: Independent with assistive device(s);Needs assistance  Gait / Transfers Assistance Needed: Uses cane in house, and walker outside of home ADL's / Homemaking  Assistance Needed: Pt reports she was mod I with all basic ADLs PTA.         OT Diagnosis: Generalized weakness;Acute pain   OT Problem List: Decreased strength;Decreased activity tolerance;Impaired balance (sitting and/or standing);Decreased knowledge of use of DME or AE;Decreased knowledge of precautions;Pain   OT Treatment/Interventions: Self-care/ADL training;Therapeutic exercise;Energy conservation;DME and/or AE instruction;Therapeutic activities;Patient/family education;Balance training    OT Goals(Current goals can be found in the care plan section) Acute Rehab OT Goals Patient Stated Goal: To get stronger; go home OT Goal Formulation: With patient/family Time For Goal Achievement: 01/18/16 Potential to Achieve Goals: Good ADL Goals Pt Will Perform Grooming: with modified independence;sitting;standing (full grooming session) Pt Will Perform Lower Body Bathing: with modified independence;sit to/from stand Pt Will Perform Lower Body Dressing: with modified independence;sit to/from stand Pt Will Transfer to Toilet: with modified independence;ambulating (3n1 over toilet) Pt Will Perform Toileting - Clothing Manipulation and hygiene: with modified independence;sitting/lateral leans;sit to/from stand Pt Will Perform Tub/Shower Transfer: Tub transfer;with modified independence;ambulating;3 in 1;rolling walker Additional ADL Goal #1: Pt will incorporate 1 energy conservation strategy into ADL task with inital cue only.   OT Frequency: Min 2X/week   Barriers to D/C:    lives alone but able to arrange 24 hour assist at d/c       Co-evaluation              End of Session  Equipment Utilized During Treatment: Gait belt;Rolling walker;Oxygen;Other (comment) (bil knee braces from home)  Activity Tolerance: Patient tolerated treatment well;Patient limited by fatigue Patient left: in bed;with call bell/phone within reach;with family/visitor present   Time: [id number] OT Time  Calculation (min): 32 min Charges:  OT General Charges $OT Visit: 1 Procedure OT Evaluation $OT Eval Low Complexity: 1 Procedure OT Treatments $Self Care/Home Management : 8-22 mins G-Codes:    Hortencia Pilar Jan 10, 2016, 3:42 PM

## 2016-01-04 NOTE — Progress Notes (Signed)
PROGRESS NOTE  [name]  [id number] DOB: 1929/07/28 DOA: 01/01/2016 PCP: Antonietta Jewel, MD Outpatient Specialists:  Dr. Moshe Cipro, Nephrology   Brief Narrative:   [name] is a 82 y.o. female with medical history significant of hypertension, hyperlipidemia, chronic kidney disease stage IV, chronic diastolic heart failure, and DVT no longer on A/c who presented with progressive shortness of breath over one month.  She also had a 30 pound weight gain, increased lower extremity edema and abdominal distention. Antibiotics and prednisone did not help. In the ER, chest x-ray demonstrated bilateral pulmonary edema with mild pleural effusions and her BNP was 1032.  Her point-of-care troponin was negative and her EKG demonstrated normal sinus rhythm without T-wave inversions or ST segment changes. She was given Lasix 120 g IV once and was admitted for acute on chronic diastolic heart failure.  Assessment & Plan:   Principal Problem:   Acute diastolic heart failure (HCC) Active Problems:   Metabolic acidosis   Hypertension, uncontrolled   Dyspnea   DVT (deep venous thrombosis) (HCC)   CKD (chronic kidney disease), stage IV (HCC)   Anemia of renal disease   Iron deficiency anemia  Acute on chronic diastolic CHF exacerbation.  - Telemetry:  Sinus rhythm with occasional Catheline Hixon bursts of SVT - TSH 2.008 - Daily weights and strict I/O:  -3.2 L - ECHO:  Moderate concentric hypertrophy, vigorous systolic function with ejection fraction of 65-70 percent. No regional wall motion abnormalities. Grade 3 diastolic dysfunction. Aortic valve calcifications. - No ACEI due to CKD and hx of hyperkalemia - continue beta blocker - Continue Lasix 80 mg IV BID - 2 gm sodium diet with 1.2L fluid restriction - Nursing staff to provide education about CHF - Nutrition consultation  Mildly elevated but flat troponins, likely due to renal disease and heart failure  -  Follow up  with cardiology as outpatient  Intermittent abdominal pain may be due to bloating from heart failure or due to urinary retention or even atypical anginal symptoms.  No improvement with bowel movement - Diuresis as above - add stool softeners -  consider CT sans contrast to eval for kidney stones, ovarian cancer - UA negative  Essential hypertension, blood pressure mildly elevated - Continue Norvasc, metoprolol 50 mg twice a day  GERD, stable, continue PPI  PVD with claudication - Continue pletal  Anxiety, stable, continue xanax prn  Normocytic anemia, likely due to renal disease and iron deficiency - Iron studies, B12, folate suggest mild iron deficiency - TSH wnl - Occult stool - Continue iron supplementation  CKD stage IV, baseline creatinine between 2 and 3. Creatinine at baseline. Chronic acidosis - Repeat BMP in AM - Hold off on sodium bicarb due to heart failure exac  Gout, stable, continue allopurinol  Hx of DVT due to hospitalization, no longer on A/C.   DVT prophylaxis: heparin  Code Status: DNR (would be okay with trial of bipap for dyspnea) Family Communication: patient and her daughter [name] Disposition Plan: Home pending diuresis/improvement in dyspnea, likely in several days  Consultants:   None  Procedures:  ECHO  Antimicrobials:   none    Subjective: Dyspnea continues to improve. She had some nausea after breakfast this morning but no vomiting. She feels that the swelling in her hands and legs is improved.  Abdominal pain remains.  Objective: Filed Vitals:   01/04/16 0612 01/04/16 1025 01/04/16 1030 01/04/16 1229  BP: 136/31 136/36  107/41  Pulse: 68  66 67  Temp:  99 F (37.2 C)   98.6 F (37 C)  TempSrc: Oral   Oral  Resp: 18   20  Height:      Weight: 99.61 kg (219 lb 9.6 oz)     SpO2: 97%   99%    Intake/Output Summary (Last 24 hours) at 01/04/16 1650 Last data filed at 01/04/16 1359  Gross per 24 hour  Intake     660 ml  Output   2800 ml  Net  -2140 ml   Filed Weights   01/02/16 0500 01/03/16 0528 01/04/16 0612  Weight: 106.414 kg (234 lb 9.6 oz) 103.738 kg (228 lb 11.2 oz) 99.61 kg (219 lb 9.6 oz)    Examination:  General exam:  Adult Female. No acute distress  HEENT:  NCAT, MMM, persistent JVP to the preauricular area while sitting upright Respiratory system:   Rales at bilateral bases, no wheeze, no rhonchi  Cardiovascular system: S1 & S2 heard, RRR. No JVD, murmurs, rubs, gallops or clicks.  Warm extremities Gastrointestinal system: Normal active bowel sounds, soft, nondistended, nontender MSK:  Normal tone and bulk, 1+ bilateral lower extremity edema Neuro:  Grossly intact Psychiatry: Judgement and insight appear normal. Mood & affect appropriate.     Data Reviewed: I have personally reviewed following labs and imaging studies  CBC:  Recent Labs Lab 01/01/16 1550  WBC 7.6  HGB 9.9*  HCT 32.3*  MCV 81.8  PLT 99991111   Basic Metabolic Panel:  Recent Labs Lab 01/01/16 1550 01/02/16 0136 01/03/16 0440 01/04/16 0245  NA 134* 137 140 140  K 4.2 4.1 3.9 3.8  CL 102 103 102 103  CO2 17* 19* 25 27  GLUCOSE 166* 133* 96 145*  BUN 70* 72* 74* 65*  CREATININE 1.17* 2.90* 2.71* 2.48*  CALCIUM 8.8* 8.6* 8.3* 8.5*   GFR: Estimated Creatinine Clearance: 19.7 mL/min (by C-G formula based on Cr of 2.48). Liver Function Tests:  Recent Labs Lab 01/01/16 1550  AST 31  ALT 31  ALKPHOS 56  BILITOT 0.3  PROT 6.5  ALBUMIN 3.4*   No results for input(s): LIPASE, AMYLASE in the last 168 hours. No results for input(s): AMMONIA in the last 168 hours. Coagulation Profile: No results for input(s): INR, PROTIME in the last 168 hours. Cardiac Enzymes:  Recent Labs Lab 01/02/16 0136 01/02/16 0520 01/02/16 1613 01/02/16 2148 01/03/16 0440  TROPONINI 0.06* 0.07* 0.05* 0.04* 0.04*   BNP (last 3 results) No results for input(s): PROBNP in the last 8760 hours. HbA1C: No results  for input(s): HGBA1C in the last 72 hours. CBG: No results for input(s): GLUCAP in the last 168 hours. Lipid Profile: No results for input(s): CHOL, HDL, LDLCALC, TRIG, CHOLHDL, LDLDIRECT in the last 72 hours. Thyroid Function Tests:  Recent Labs  01/01/16 2045  TSH 2.008   Anemia Panel:  Recent Labs  01/02/16 0520  VITAMINB12 870  FOLATE 31.6  FERRITIN 113  TIBC 252  IRON 24*   Urine analysis:    Component Value Date/Time   COLORURINE YELLOW 01/01/2016 2100   APPEARANCEUR CLEAR 01/01/2016 2100   LABSPEC 1.008 01/01/2016 2100   PHURINE 5.5 01/01/2016 2100   GLUCOSEU NEGATIVE 01/01/2016 2100   HGBUR NEGATIVE 01/01/2016 2100   [place] NEGATIVE 01/01/2016 2100   [place] NEGATIVE 01/01/2016 2100   PROTEINUR NEGATIVE 01/01/2016 2100   UROBILINOGEN 0.2 11/21/2014 2230   NITRITE NEGATIVE 01/01/2016 2100   LEUKOCYTESUR NEGATIVE 01/01/2016 2100   Sepsis Labs: @LABRCNTIP (procalcitonin:4,lacticidven:4)  ) Recent Results (from the  past 240 hour(s))  Culture, Urine     Status: Abnormal   Collection Time: 01/01/16  9:01 PM  Result Value Ref Range Status   Specimen Description URINE, RANDOM  Final   Special Requests NONE  Final   Culture MULTIPLE SPECIES PRESENT, SUGGEST RECOLLECTION (A)  Final   Report Status 01/03/2016 FINAL  Final      Radiology Studies: No results found.   Scheduled Meds: . allopurinol  100 mg Oral BID  . amLODipine  10 mg Oral Daily  . antiseptic oral rinse  7 mL Mouth Rinse BID  . cilostazol  100 mg Oral BID  . docusate sodium  100 mg Oral BID  . feeding supplement (ENSURE ENLIVE)  237 mL Oral TID PC  . ferrous sulfate  325 mg Oral Q breakfast  . furosemide  80 mg Intravenous BID  . heparin  5,000 Units Subcutaneous Q8H  . hydrALAZINE  100 mg Oral BID  . magnesium chloride  2 tablet Oral BID  . metoprolol tartrate  50 mg Oral BID  . multivitamin with minerals  1 tablet Oral Daily  . pantoprazole  40 mg Oral Daily  . polyethylene  glycol  17 g Oral Daily  . potassium chloride  40 mEq Oral Daily  . senna  2 tablet Oral QHS  . sodium chloride flush  3 mL Intravenous Q12H   Continuous Infusions:    LOS: 3 days    Time spent: 30 min    Janece Canterbury, MD [facility] Pager [phone]  If 7PM-7AM, please contact night-coverage [email or web address] Password [email or web address] 01/04/2016, 4:50 PM

## 2016-01-04 NOTE — Progress Notes (Signed)
OT Cancellation Note  Patient Details Name: [name] MRN: [id number] DOB: 06/19/1929   Cancelled Treatment:    Reason Eval/Treat Not Completed: Other (comment) (Pt just returned to bed per daughter. Request OT come back later as pt is now resting. ) OT to reattempt as schedule permits.  Hortencia Pilar 01/04/2016, 11:54 AM

## 2016-01-05 LAB — BASIC METABOLIC PANEL WITH GFR
Anion gap: 14 (ref 5–15)
BUN: 59 mg/dL — ABNORMAL HIGH (ref 6–20)
CO2: 24 mmol/L (ref 22–32)
Calcium: 8.8 mg/dL — ABNORMAL LOW (ref 8.9–10.3)
Chloride: 104 mmol/L (ref 101–111)
Creatinine, Ser: 2.19 mg/dL — ABNORMAL HIGH (ref 0.44–1.00)
GFR calc Af Amer: 23 mL/min — ABNORMAL LOW
GFR calc non Af Amer: 19 mL/min — ABNORMAL LOW
Glucose, Bld: 100 mg/dL — ABNORMAL HIGH (ref 65–99)
Potassium: 4.8 mmol/L (ref 3.5–5.1)
Sodium: 142 mmol/L (ref 135–145)

## 2016-01-05 LAB — MAGNESIUM: Magnesium: 2.2 mg/dL (ref 1.7–2.4)

## 2016-01-05 MED ORDER — METOPROLOL TARTRATE 25 MG PO TABS: 25.0000 mg | ORAL_TABLET | Freq: Two times a day (BID) | ORAL | Status: DC

## 2016-01-05 MED ORDER — ENSURE ENLIVE PO LIQD
237.0000 mL | Freq: Two times a day (BID) | ORAL | Status: DC
  Administered 2016-01-05 – 2016-01-07 (×4): 237 mL via ORAL

## 2016-01-05 MED ORDER — METOPROLOL TARTRATE 12.5 MG HALF TABLET
12.5000 mg | ORAL_TABLET | Freq: Two times a day (BID) | ORAL | Status: DC
  Administered 2016-01-05 – 2016-01-06 (×3): 12.5 mg via ORAL
  Filled 2016-01-05 (×3): qty 1

## 2016-01-05 MED ORDER — HYDRALAZINE HCL 25 MG PO TABS
25.0000 mg | ORAL_TABLET | Freq: Two times a day (BID) | ORAL | Status: DC
  Administered 2016-01-05 – 2016-01-07 (×5): 25 mg via ORAL
  Filled 2016-01-05 (×5): qty 1

## 2016-01-05 NOTE — Progress Notes (Signed)
SATURATION QUALIFICATIONS: (This note is used to comply with regulatory documentation for home oxygen)  Patient Saturations on Room Air at Rest = 94%  Patient Saturations on Room Air while Ambulating = 91%  Patient Saturations on -- Liters of oxygen while Ambulating = --%  Please briefly explain why patient needs home oxygen: Patient DOES NOT NEED O2   01/05/2016 Barry Brunner, PT Pager: [phone]

## 2016-01-05 NOTE — Progress Notes (Signed)
Physical Therapy Treatment Patient Details Name: [name] MRN: [id number] DOB: 06-22-1929 Today's Date: 01/05/2016    History of Present Illness Patient is an 82 yo female admitted 01/01/16 with SOB.  Patient with CHF and intermittent abdominal pain.   PMH:  HTN, HLD, CKD, CHF, DVT, PVD with claudication, Bil TKA (wears knee braces), anemia    PT Comments    Patient doing much better today (overall minguard to supervision level). Discharge plan updated to HHPT. She maintained SaO2 91% walking on room air.  Follow Up Recommendations  Home health PT;Supervision for mobility/OOB     Equipment Recommendations  None recommended by PT    Recommendations for Other Services       Precautions / Restrictions Precautions Precautions: Fall Required Braces or Orthoses: Other Brace/Splint Other Brace/Splint: bil neoprene knee braces Restrictions Weight Bearing Restrictions: No    Mobility  Bed Mobility Overal bed mobility: Needs Assistance Bed Mobility: Rolling;Sidelying to Sit;Sit to Supine Rolling: Modified independent (Device/Increase time) Sidelying to sit: Supervision   Sit to supine: Supervision   General bed mobility comments: no cues needed, HOB flat; did use rail  Transfers Overall transfer level: Needs assistance Equipment used: Rolling walker (2 wheeled) Transfers: Sit to/from Stand Sit to Stand: Supervision         General transfer comment: from EOB no cues needed; from standard toilet, pt used grab bar and lots of effort (but no outside assist)  Ambulation/Gait Ambulation/Gait assistance: Supervision Ambulation Distance (Feet): 55 Feet Assistive device: Rolling walker (2 wheeled) Gait Pattern/deviations: Step-through pattern;Decreased stride length;Shuffle Gait velocity: decreased appropriately due to dyspnea 2/4   General Gait Details: pt upright and proper use of RW   Stairs Stairs:  (pt reports stoop and verbalized proper way with RW)           Wheelchair Mobility    Modified Rankin (Stroke Patients Only)       Balance     Sitting balance-Leahy Scale: Good Sitting balance - Comments: donned bil knee braces I'ly   Standing balance support: No upper extremity supported Standing balance-Leahy Scale: Fair                      Cognition Arousal/Alertness: Awake/alert Behavior During Therapy: WFL for tasks assessed/performed Overall Cognitive Status: Within Functional Limits for tasks assessed                      Exercises      General Comments General comments (skin integrity, edema, etc.): Daughter and grandaughter present. Made it clear that they were not even considering SNF and pt will be cared for at home.      Pertinent Vitals/Pain Pain Assessment: Faces Faces Pain Scale: Hurts little more Pain Location: bil feet/toes Pain Descriptors / Indicators: Grimacing;Discomfort Pain Intervention(s): Limited activity within patient's tolerance;Monitored during session;Other (comment) (Pt could not tolerate gripper socks over toes; dtr cut tips )    Home Living                      Prior Function            PT Goals (current goals can now be found in the care plan section) Acute Rehab PT Goals Patient Stated Goal: To get stronger; go home Time For Goal Achievement: 01/16/16 Progress towards PT goals: Progressing toward goals    Frequency  Min 3X/week    PT Plan Discharge plan needs to be updated  Co-evaluation             End of Session   Activity Tolerance: Patient tolerated treatment well Patient left: with call bell/phone within reach;with family/visitor present;in bed;Other (comment) (IV team present )     Time: [id number] PT Time Calculation (min) (ACUTE ONLY): 32 min  Charges:  $Gait Training: 8-22 mins $Therapeutic Activity: 8-22 mins                    G Codes:      Delanee Xin 17-Jan-2016, 3:36 PM Pager [phone]

## 2016-01-05 NOTE — Clinical Social Work Note (Addendum)
CSW met with patient. Patient's daughter at bedside. Both patient and her daughter do not want SNF. Patient's daughter wants home PT, a nurse, and information on Diabetes education classes. They also want to work with a nutritionist. Patient's daughter stated that someone will be with patient 24/7 at home. She lives with her granddaughter.  CSW provided RNCM with this information. CSW encouraged patient and her family to contact CSW as needed.  CSW signing off. Consult if any other social work needs arise.  Dayton Scrape, [place]

## 2016-01-05 NOTE — Progress Notes (Signed)
PROGRESS NOTE  [name]  [id number] DOB: 06/03/1929 DOA: 01/01/2016 PCP: Antonietta Jewel, MD Outpatient Specialists:  Dr. Moshe Cipro, Nephrology   Brief Narrative:   [name] is a 82 y.o. female with medical history significant of hypertension, hyperlipidemia, chronic kidney disease stage IV, chronic diastolic heart failure, and DVT no longer on A/c who presented with progressive shortness of breath over one month.  She also had a 30 pound weight gain, increased lower extremity edema and abdominal distention. Antibiotics and prednisone did not help. In the ER, chest x-ray demonstrated bilateral pulmonary edema with mild pleural effusions and her BNP was 1032.  Her point-of-care troponin was negative and her EKG demonstrated normal sinus rhythm without T-wave inversions or ST segment changes. She was given Lasix 120 g IV once and was admitted for acute on chronic diastolic heart failure.  Assessment & Plan:   Principal Problem:   Acute diastolic heart failure (HCC) Active Problems:   Metabolic acidosis   Hypertension, uncontrolled   Dyspnea   DVT (deep venous thrombosis) (HCC)   CKD (chronic kidney disease), stage IV (HCC)   Anemia of renal disease   Iron deficiency anemia  Acute on chronic diastolic CHF exacerbation.  - Telemetry:  Sinus rhythm - TSH 2.008 - Daily weights and strict I/O:  -650 mL - ECHO:  Moderate concentric hypertrophy, vigorous systolic function with ejection fraction of 65-70 percent. No regional wall motion abnormalities. Grade 3 diastolic dysfunction. Aortic valve calcifications. - No ACEI due to CKD and hx of hyperkalemia - on beta blocker - Continue Lasix 80 mg IV BID and plan to switch to torsemide 40mg  daily tomorrow - 2 gm sodium diet with 1.2L fluid restriction  Mildly elevated but flat troponins, likely due to renal disease and heart failure  -  Follow up with cardiology as outpatient  Intermittent abdominal pain likely  due to bloating from heart failure and improving with diuresis - Diuresis as above - continue stool softeners - UA negative  Essential hypertension, blood pressure low normal - d/c Norvasc -  Reduce hydralazine with hold parameters -  Reduce metoprolol to 12.5mg  BID with hold parameters  GERD, stable, continue PPI  PVD with claudication - Continue pletal  Anxiety, stable, continue xanax prn  Normocytic anemia, likely due to renal disease and iron deficiency - Iron studies, B12, folate suggest mild iron deficiency - TSH wnl - Occult stool - Continue iron supplementation  CKD stage IV, baseline creatinine between 2 and 3. Creatinine continues to improve. Chronic acidosis - Repeat BMP in AM - Hold off on sodium bicarb due to heart failure exac  Gout, stable, continue allopurinol  Hx of DVT due to hospitalization, no longer on A/C.   DVT prophylaxis: heparin  Code Status: DNR (would be okay with trial of bipap for dyspnea) Family Communication: patient and her daughter [name] Disposition Plan: Home likely Wednesday.  Need to wean off oxygen.  Will transition to oral diuretic tomorrow and if still diuresing some, kidney function stable, home with [facility] services Wed.    Consultants:   None  Procedures:  ECHO  Antimicrobials:   none    Subjective: Dyspnea continues to improve. Still has some abdominal pain, but improving.  Swelling in her hands and legs has improved.   Objective: Filed Vitals:   01/04/16 1030 01/04/16 1229 01/04/16 2229 01/05/16 0543  BP:  107/41 132/46 132/39  Pulse: 66 67 63 66  Temp:  98.6 F (37 C) 99.1 F (37.3 C) 98.9  F (37.2 C)  TempSrc:  Oral Oral Oral  Resp:  20 20 20   Height:      Weight:    99.383 kg (219 lb 1.6 oz)  SpO2:  99% 98% 98%    Intake/Output Summary (Last 24 hours) at 01/05/16 0920 Last data filed at 01/05/16 0909  Gross per 24 hour  Intake    723 ml  Output   1250 ml  Net   -527 ml   Filed Weights    01/03/16 0528 01/04/16 0612 01/05/16 0543  Weight: 103.738 kg (228 lb 11.2 oz) 99.61 kg (219 lb 9.6 oz) 99.383 kg (219 lb 1.6 oz)    Examination:  General exam:  Adult Female. No acute distress  HEENT:  NCAT, MMM, persistent JVP to just below the ear while sitting upright Respiratory system:   Rales at bilateral bases, no wheeze, no rhonchi  Cardiovascular system: S1 & S2 heard, RRR. No JVD, murmurs, rubs, gallops or clicks.  Warm extremities Gastrointestinal system: Normal active bowel sounds, soft, nondistended, nontender MSK:  Normal tone and bulk, 1+ bilateral lower extremity edema Neuro:  Grossly intact Psychiatry: Judgement and insight appear normal. Mood & affect appropriate.     Data Reviewed: I have personally reviewed following labs and imaging studies  CBC:  Recent Labs Lab 01/01/16 1550  WBC 7.6  HGB 9.9*  HCT 32.3*  MCV 81.8  PLT 99991111   Basic Metabolic Panel:  Recent Labs Lab 01/01/16 1550 01/02/16 0136 01/03/16 0440 01/04/16 0245 01/05/16 0240  NA 134* 137 140 140 142  K 4.2 4.1 3.9 3.8 4.8  CL 102 103 102 103 104  CO2 17* 19* 25 27 24   GLUCOSE 166* 133* 96 145* 100*  BUN 70* 72* 74* 65* 59*  CREATININE 1.17* 2.90* 2.71* 2.48* 2.19*  CALCIUM 8.8* 8.6* 8.3* 8.5* 8.8*  MG  --   --   --   --  2.2   GFR: Estimated Creatinine Clearance: 22.3 mL/min (by C-G formula based on Cr of 2.19). Liver Function Tests:  Recent Labs Lab 01/01/16 1550  AST 31  ALT 31  ALKPHOS 56  BILITOT 0.3  PROT 6.5  ALBUMIN 3.4*   No results for input(s): LIPASE, AMYLASE in the last 168 hours. No results for input(s): AMMONIA in the last 168 hours. Coagulation Profile: No results for input(s): INR, PROTIME in the last 168 hours. Cardiac Enzymes:  Recent Labs Lab 01/02/16 0136 01/02/16 0520 01/02/16 1613 01/02/16 2148 01/03/16 0440  TROPONINI 0.06* 0.07* 0.05* 0.04* 0.04*   BNP (last 3 results) No results for input(s): PROBNP in the last 8760  hours. HbA1C: No results for input(s): HGBA1C in the last 72 hours. CBG: No results for input(s): GLUCAP in the last 168 hours. Lipid Profile: No results for input(s): CHOL, HDL, LDLCALC, TRIG, CHOLHDL, LDLDIRECT in the last 72 hours. Thyroid Function Tests: No results for input(s): TSH, T4TOTAL, FREET4, T3FREE, THYROIDAB in the last 72 hours. Anemia Panel: No results for input(s): VITAMINB12, FOLATE, FERRITIN, TIBC, IRON, RETICCTPCT in the last 72 hours. Urine analysis:    Component Value Date/Time   COLORURINE YELLOW 01/01/2016 2100   APPEARANCEUR CLEAR 01/01/2016 2100   LABSPEC 1.008 01/01/2016 2100   PHURINE 5.5 01/01/2016 2100   GLUCOSEU NEGATIVE 01/01/2016 2100   HGBUR NEGATIVE 01/01/2016 2100   [place] NEGATIVE 01/01/2016 2100   [place] NEGATIVE 01/01/2016 2100   PROTEINUR NEGATIVE 01/01/2016 2100   UROBILINOGEN 0.2 11/21/2014 2230   NITRITE NEGATIVE 01/01/2016 2100  LEUKOCYTESUR NEGATIVE 01/01/2016 2100   Sepsis Labs: @LABRCNTIP (procalcitonin:4,lacticidven:4)  ) Recent Results (from the past 240 hour(s))  Culture, Urine     Status: Abnormal   Collection Time: 01/01/16  9:01 PM  Result Value Ref Range Status   Specimen Description URINE, RANDOM  Final   Special Requests NONE  Final   Culture MULTIPLE SPECIES PRESENT, SUGGEST RECOLLECTION (A)  Final   Report Status 01/03/2016 FINAL  Final      Radiology Studies: No results found.   Scheduled Meds: . allopurinol  100 mg Oral BID  . antiseptic oral rinse  7 mL Mouth Rinse BID  . cilostazol  100 mg Oral BID  . docusate sodium  100 mg Oral BID  . feeding supplement (ENSURE ENLIVE)  237 mL Oral BID PC  . ferrous sulfate  325 mg Oral Q breakfast  . furosemide  80 mg Intravenous BID  . heparin  5,000 Units Subcutaneous Q8H  . hydrALAZINE  25 mg Oral BID  . metoprolol tartrate  25 mg Oral BID  . multivitamin with minerals  1 tablet Oral Daily  . pantoprazole  40 mg Oral Daily  . polyethylene glycol  17  g Oral Daily  . senna  2 tablet Oral QHS  . sodium chloride flush  3 mL Intravenous Q12H   Continuous Infusions:    LOS: 4 days    Time spent: 30 min    Janece Canterbury, MD [facility] Pager [phone]  If 7PM-7AM, please contact night-coverage [email or web address] Password [email or web address] 01/05/2016, 9:20 AM

## 2016-01-06 ENCOUNTER — Inpatient Hospital Stay (HOSPITAL_COMMUNITY): Payer: Medicare HMO

## 2016-01-06 DIAGNOSIS — R509 Fever, unspecified: Secondary | ICD-10-CM

## 2016-01-06 LAB — BASIC METABOLIC PANEL
ANION GAP: 12 (ref 5–15)
BUN: 48 mg/dL — ABNORMAL HIGH (ref 6–20)
CALCIUM: 9.2 mg/dL (ref 8.9–10.3)
CO2: 31 mmol/L (ref 22–32)
Chloride: 100 mmol/L — ABNORMAL LOW (ref 101–111)
Creatinine, Ser: 1.88 mg/dL — ABNORMAL HIGH (ref 0.44–1.00)
GFR, EST AFRICAN AMERICAN: 27 mL/min — AB (ref >60)
GFR, EST NON AFRICAN AMERICAN: 23 mL/min — AB (ref >60)
GLUCOSE: 97 mg/dL (ref 65–99)
Potassium: 4.3 mmol/L (ref 3.5–5.1)
SODIUM: 143 mmol/L (ref 135–145)

## 2016-01-06 LAB — URINALYSIS, ROUTINE W REFLEX MICROSCOPIC
BILIRUBIN URINE: NEGATIVE
GLUCOSE, UA: NEGATIVE mg/dL
HGB URINE DIPSTICK: NEGATIVE
KETONES UR: NEGATIVE mg/dL
Leukocytes, UA: NEGATIVE
Nitrite: NEGATIVE
PH: 7 (ref 5.0–8.0)
Protein, ur: 30 mg/dL — AB
Specific Gravity, Urine: 1.013 (ref 1.005–1.030)

## 2016-01-06 LAB — URINE MICROSCOPIC-ADD ON

## 2016-01-06 MED ORDER — METOPROLOL TARTRATE 25 MG PO TABS
25.0000 mg | ORAL_TABLET | Freq: Two times a day (BID) | ORAL | Status: DC
  Administered 2016-01-06 – 2016-01-07 (×2): 25 mg via ORAL
  Filled 2016-01-06 (×2): qty 1

## 2016-01-06 MED ORDER — TORSEMIDE 20 MG PO TABS
40.0000 mg | ORAL_TABLET | Freq: Two times a day (BID) | ORAL | Status: DC
  Administered 2016-01-06 – 2016-01-07 (×3): 40 mg via ORAL
  Filled 2016-01-06 (×3): qty 2

## 2016-01-06 NOTE — Progress Notes (Addendum)
PROGRESS NOTE  [name]  [id number] DOB: 07-28-1929 DOA: 01/01/2016 PCP: Antonietta Jewel, MD Outpatient Specialists:  Dr. Moshe Cipro, Nephrology   Brief Narrative:   [name] is a 82 y.o. female with medical history significant of hypertension, hyperlipidemia, chronic kidney disease stage IV, chronic diastolic heart failure, and DVT no longer on A/c who presented with progressive shortness of breath over one month.  She also had a 30 pound weight gain, increased lower extremity edema and abdominal distention. Antibiotics and prednisone did not help. In the ER, chest x-ray demonstrated bilateral pulmonary edema with mild pleural effusions and her BNP was 1032.  Her point-of-care troponin was negative and her EKG demonstrated normal sinus rhythm without T-wave inversions or ST segment changes. She was given Lasix 120 g IV once and was admitted for acute on chronic diastolic heart failure.  Assessment & Plan:   Principal Problem:   Acute on chronic diastolic heart failure (HCC) Active Problems:   Metabolic acidosis   Hypertension, uncontrolled   Acute respiratory failure with hypoxia (HCC)   CKD (chronic kidney disease), stage IV (HCC)   Anemia of renal disease   Iron deficiency anemia   Fever, unspecified  Acute on chronic diastolic CHF exacerbation, likely due to dietary noncompliance.  Encouraged low sodium diet. -  ECHO:  Moderate concentric hypertrophy, vigorous systolic function with ejection fraction of 65-70 percent. No regional wall motion abnormalities. Grade 3 diastolic dysfunction. Aortic valve calcifications. - TSH 2.008 - -3.14L - No ACEI due to CKD and hx of hyperkalemia - Continue beta blocker - d/c Lasix 80 mg IV BID and start torsemide 40mg  daily today  Low grade fever, unclear etiology.  She has persistent rales at the left base which could represent pneumonia, but she is not coughing heavily and feels improved with diuresis -  UA:  negative -  CXR:  Improvement in congestion -  F/u blood cultures  Mildly elevated but flat troponins, likely due to renal disease and heart failure  -  Follow up with cardiology as outpatient  Intermittent abdominal pain likely due to bloating from heart failure and improving with diuresis - Diuresis as above - continue stool softeners - UA negative  Essential hypertension, borderline hypotension a day ago, now hypertensive - Continue to hold Norvasc -  Continue reduced dose hydralazine with hold parameters -  Increase metoprolol to  25 mg BID with hold parameters  GERD, stable, continue PPI  PVD with claudication - Continue pletal  Anxiety, stable, continue xanax prn  Normocytic anemia, likely due to renal disease and iron deficiency - Iron studies, B12, folate suggest mild iron deficiency - TSH wnl - Occult stool - Continue iron supplementation  CKD stage IV, baseline creatinine between 2 and 3. Creatinine continues to improve. Chronic acidosis - Repeat BMP in AM - Hold off on sodium bicarb due to heart failure exac  Gout, stable, continue allopurinol  Hx of DVT due to hospitalization, no longer on A/C.   DVT prophylaxis: heparin  Code Status: DNR (would be okay with trial of bipap for dyspnea) Family Communication: patient and her daughter [name] Disposition Plan: Home likely Wednesday if kidney function approximately stable and still diuresing some with close outpatient follow up with cardiology.  Home with [facility] services Wed.    Consultants:   None  Procedures:  ECHO  Antimicrobials:   none    Subjective: Dyspnea continues to improve.  Transitioned off oxygen yesterday and felt well enough to get out of bed  to do her grooming today.  Felt flushed/hot last night.  Still has some abdominal pain, but improving.  Swelling in her hands and legs has improved.   Objective: Filed Vitals:   01/05/16 2104 01/06/16 0554 01/06/16 0613 01/06/16 1216   BP: 139/45  169/41 137/30  Pulse: 80  73 73  Temp: 100.5 F (38.1 C)  98.6 F (37 C) 99.4 F (37.4 C)  TempSrc: Oral  Oral Oral  Resp: 18  18 16   Height:      Weight:  96.299 kg (212 lb 4.8 oz)    SpO2: 93%  97% 98%    Intake/Output Summary (Last 24 hours) at 01/06/16 1809 Last data filed at 01/06/16 1520  Gross per 24 hour  Intake    960 ml  Output   3285 ml  Net  -2325 ml   Filed Weights   01/04/16 0612 01/05/16 0543 01/06/16 0554  Weight: 99.61 kg (219 lb 9.6 oz) 99.383 kg (219 lb 1.6 oz) 96.299 kg (212 lb 4.8 oz)    Examination:  General exam:  Adult Female. No acute distress  HEENT:  NCAT, MMM Respiratory system:   Rales at left base more than right, no wheeze, no rhonchi  Cardiovascular system: S1 & S2 heard, RRR. No murmurs, rubs, gallops or clicks.  Warm extremities Gastrointestinal system: Normal active bowel sounds, soft, nondistended, nontender MSK:  Normal tone and bulk, 1+ bilateral lower extremity edema Neuro:  Grossly intact Psychiatry: Judgement and insight appear normal. Mood & affect appropriate.     Data Reviewed: I have personally reviewed following labs and imaging studies  CBC:  Recent Labs Lab 01/01/16 1550  WBC 7.6  HGB 9.9*  HCT 32.3*  MCV 81.8  PLT 99991111   Basic Metabolic Panel:  Recent Labs Lab 01/02/16 0136 01/03/16 0440 01/04/16 0245 01/05/16 0240 01/06/16 0406  NA 137 140 140 142 143  K 4.1 3.9 3.8 4.8 4.3  CL 103 102 103 104 100*  CO2 19* 25 27 24 31   GLUCOSE 133* 96 145* 100* 97  BUN 72* 74* 65* 59* 48*  CREATININE 2.90* 2.71* 2.48* 2.19* 1.88*  CALCIUM 8.6* 8.3* 8.5* 8.8* 9.2  MG  --   --   --  2.2  --    GFR: Estimated Creatinine Clearance: 25.6 mL/min (by C-G formula based on Cr of 1.88). Liver Function Tests:  Recent Labs Lab 01/01/16 1550  AST 31  ALT 31  ALKPHOS 56  BILITOT 0.3  PROT 6.5  ALBUMIN 3.4*   No results for input(s): LIPASE, AMYLASE in the last 168 hours. No results for input(s):  AMMONIA in the last 168 hours. Coagulation Profile: No results for input(s): INR, PROTIME in the last 168 hours. Cardiac Enzymes:  Recent Labs Lab 01/02/16 0136 01/02/16 0520 01/02/16 1613 01/02/16 2148 01/03/16 0440  TROPONINI 0.06* 0.07* 0.05* 0.04* 0.04*   BNP (last 3 results) No results for input(s): PROBNP in the last 8760 hours. HbA1C: No results for input(s): HGBA1C in the last 72 hours. CBG: No results for input(s): GLUCAP in the last 168 hours. Lipid Profile: No results for input(s): CHOL, HDL, LDLCALC, TRIG, CHOLHDL, LDLDIRECT in the last 72 hours. Thyroid Function Tests: No results for input(s): TSH, T4TOTAL, FREET4, T3FREE, THYROIDAB in the last 72 hours. Anemia Panel: No results for input(s): VITAMINB12, FOLATE, FERRITIN, TIBC, IRON, RETICCTPCT in the last 72 hours. Urine analysis:    Component Value Date/Time   COLORURINE YELLOW 01/06/2016 0902   APPEARANCEUR CLEAR 01/06/2016  0902   LABSPEC 1.013 01/06/2016 0902   PHURINE 7.0 01/06/2016 0902   GLUCOSEU NEGATIVE 01/06/2016 0902   HGBUR NEGATIVE 01/06/2016 0902   BILIRUBINUR NEGATIVE 01/06/2016 0902   KETONESUR NEGATIVE 01/06/2016 0902   PROTEINUR 30* 01/06/2016 0902   UROBILINOGEN 0.2 11/21/2014 2230   NITRITE NEGATIVE 01/06/2016 0902   LEUKOCYTESUR NEGATIVE 01/06/2016 0902   Sepsis Labs: @LABRCNTIP (procalcitonin:4,lacticidven:4)  ) Recent Results (from the past 240 hour(s))  Culture, Urine     Status: Abnormal   Collection Time: 01/01/16  9:01 PM  Result Value Ref Range Status   Specimen Description URINE, RANDOM  Final   Special Requests NONE  Final   Culture MULTIPLE SPECIES PRESENT, SUGGEST RECOLLECTION (A)  Final   Report Status 01/03/2016 FINAL  Final      Radiology Studies: Dg Chest Port 1 View  01/06/2016  CLINICAL DATA:  Fever EXAM: PORTABLE CHEST 1 VIEW COMPARISON:  01/01/2016 FINDINGS: Cardiomegaly again noted. Improvement in aeration without pulmonary edema. Mild degenerative changes  bilateral shoulders. Trace left pleural effusion with left basilar atelectasis or infiltrate. IMPRESSION: Improvement in aeration without pulmonary edema. Trace left pleural effusion with left basilar atelectasis or infiltrate. Degenerative changes bilateral shoulders. Electronically Signed   By: Lahoma Crocker M.D.   On: 01/06/2016 07:50     Scheduled Meds: . allopurinol  100 mg Oral BID  . antiseptic oral rinse  7 mL Mouth Rinse BID  . cilostazol  100 mg Oral BID  . docusate sodium  100 mg Oral BID  . feeding supplement (ENSURE ENLIVE)  237 mL Oral BID PC  . ferrous sulfate  325 mg Oral Q breakfast  . heparin  5,000 Units Subcutaneous Q8H  . hydrALAZINE  25 mg Oral BID  . metoprolol tartrate  25 mg Oral BID  . multivitamin with minerals  1 tablet Oral Daily  . pantoprazole  40 mg Oral Daily  . polyethylene glycol  17 g Oral Daily  . senna  2 tablet Oral QHS  . sodium chloride flush  3 mL Intravenous Q12H  . torsemide  40 mg Oral BID   Continuous Infusions:    LOS: 5 days    Time spent: 30 min    Janece Canterbury, MD [facility] Pager [phone]  If 7PM-7AM, please contact night-coverage [email or web address] Password [email or web address] 01/06/2016, 6:09 PM

## 2016-01-06 NOTE — Progress Notes (Signed)
OT Cancellation Note  Patient Details Name: [name] MRN: [id number] DOB: [birth year]   Cancelled Treatment:    Reason Eval/Treat Not Completed: Fatigue/lethargy limiting ability to participate - pt sleeping soundly, unable to adequately arouse.   Darlina Rumpf [place], OTR/L [id number]  01/06/2016, 7:25 PM

## 2016-01-06 NOTE — Progress Notes (Signed)
Nutrition Education Note  RD consulted for nutrition education regarding CHF. RD met with patient and her daughter, [name], at bedside.   RD provided "Low Sodium Nutrition Therapy" handout from the [employer]. Reviewed patient's dietary recall. Provided examples on ways to decrease sodium intake in diet. Discouraged intake of processed foods and use of salt shaker. Encouraged fresh fruits and vegetables as well as whole grain sources of carbohydrates to maximize fiber intake.   RD discussed why it is important for patient to adhere to diet recommendations, and emphasized the role of fluids, foods to avoid, and importance of weighing self daily. Teach back method used.  Expect good compliance. Pt identified soup, sourdough, and ham as foods she needs to avoid.   Current diet order is Heart Healthy, patient is consuming approximately 30-75% of meals at this time. Drinking Ensure Enlive PRN. Labs and medications reviewed. RD contact information provided.Will continue to monitor patient for PO adequacy.   [name] RD, LDN Inpatient Clinical Dietitian Pager: [phone] After Hours Pager: [phone]

## 2016-01-06 NOTE — Care Management Note (Signed)
Case Management Note  Patient Details  Name: [name] MRN: [id number] Date of Birth: 05/18/1929  Subjective/Objective:         Admitted with CHF           Action/Plan: CM talked to patient with her daughter present in room. PCP is Dr Rueben Bash, she has private insurance with [facility] with prescription drug coverage; pharmacy of choice is [facility]. Patient and daughter reports no problem getting her medication. She has a Marketing executive at home and a cane. She reports that she weighs herself daily and eats a heart health diet. Patient could benefit from a Disease Management Program for CHF, pt requested [facility] services. Karolee Stamps with [facility] called for arrangements. Attending MD at discharge please enter the face to face document for [facility] services inEpic.  Expected Discharge Date:    possibly 01/07/2016            Expected Discharge Plan:  [facility]  Discharge planning Services  CM Consult  Choice offered to:  Adult Children  HH Arranged:  RN, Disease Management, PT, OT, Nurse's Aide HH Agency:  [place]  Status of Service:  In process, will continue to follow  Sherrilyn Rist [id number] 01/06/2016, 11:52 AM

## 2016-01-07 DIAGNOSIS — I5033 Acute on chronic diastolic (congestive) heart failure: Secondary | ICD-10-CM

## 2016-01-07 LAB — CBC
HEMATOCRIT: 31.7 % — AB (ref 36.0–46.0)
HEMOGLOBIN: 9.6 g/dL — AB (ref 12.0–15.0)
MCH: 25.3 pg — ABNORMAL LOW (ref 26.0–34.0)
MCHC: 30.3 g/dL (ref 30.0–36.0)
MCV: 83.4 fL (ref 78.0–100.0)
Platelets: 223 10*3/uL (ref 150–400)
RBC: 3.8 MIL/uL — ABNORMAL LOW (ref 3.87–5.11)
RDW: 16.4 % — ABNORMAL HIGH (ref 11.5–15.5)
WBC: 7 10*3/uL (ref 4.0–10.5)

## 2016-01-07 LAB — BASIC METABOLIC PANEL
ANION GAP: 15 (ref 5–15)
BUN: 46 mg/dL — ABNORMAL HIGH (ref 6–20)
CHLORIDE: 100 mmol/L — AB (ref 101–111)
CO2: 30 mmol/L (ref 22–32)
Calcium: 9.2 mg/dL (ref 8.9–10.3)
Creatinine, Ser: 2.14 mg/dL — ABNORMAL HIGH (ref 0.44–1.00)
GFR calc Af Amer: 23 mL/min — ABNORMAL LOW (ref >60)
GFR, EST NON AFRICAN AMERICAN: 20 mL/min — AB (ref >60)
GLUCOSE: 107 mg/dL — AB (ref 65–99)
POTASSIUM: 4.2 mmol/L (ref 3.5–5.1)
SODIUM: 145 mmol/L (ref 135–145)

## 2016-01-07 MED ORDER — TORSEMIDE 20 MG PO TABS
40.0000 mg | ORAL_TABLET | Freq: Two times a day (BID) | ORAL | Status: DC
Start: 2016-01-07 — End: 2016-01-15

## 2016-01-07 MED ORDER — METOPROLOL TARTRATE 25 MG PO TABS: 25.0000 mg | ORAL_TABLET | Freq: Two times a day (BID) | ORAL | Status: DC

## 2016-01-07 MED ORDER — HYDRALAZINE HCL 25 MG PO TABS: 25.0000 mg | ORAL_TABLET | Freq: Two times a day (BID) | ORAL | Status: DC

## 2016-01-07 NOTE — Discharge Summary (Addendum)
[name], is a 82 y.o. female  DOB [birth year]  MRN [id number].  Admission date:  01/01/2016  Admitting Physician  Janece Canterbury, MD  Discharge Date:  01/07/2016   Primary MD  Antonietta Jewel, MD  Recommendations for primary care physician for things to follow:   Patient's antihypertensive agents have been changed to metoprolol 25 mg bid, hydralazine 25 bid and torsemide bid. Follow on renal function and electrolytes.    Admission Diagnosis  Dyspnea [R06.00]   Discharge Diagnosis  Dyspnea [R06.00]    Principal Problem:   Acute on chronic diastolic heart failure (HCC) Active Problems:   Metabolic acidosis   Hypertension, uncontrolled   Acute respiratory failure with hypoxia (HCC)   CKD (chronic kidney disease), stage IV (HCC)   Anemia of renal disease   Iron deficiency anemia   Fever, unspecified      Past Medical History  Diagnosis Date  . Hypertension   . Hypercholesteremia   . Arthritis   . Heart murmur     long time ago--no problems  . Chronic kidney disease     had some renal insufficiency.  Sees Dr. Moshe Cipro who took her off benicar in 2013.               Past Surgical History  Procedure Laterality Date  . Joint replacement      both knees  . Failed knee surgery      on the right x 2  . Cataract extraction w/phaco Right 03/20/2014    Procedure: CATARACT EXTRACTION PHACO AND INTRAOCULAR LENS PLACEMENT (IOC);  Surgeon: [name], MD;  Location: [place];  Service: Ophthalmology;  Laterality: Right;       HPI  from the history and physical done on the day of admission:     This is a 82 yo female who presents to the hospital with the chief complain of shortness of breath. Her symptoms have been present for the last 4 weeks prior hospitalization, associated with orthopnea and lower extremities edema. Positive weight gain about 30 lbs over the last 2 weeks before admission.  Due to her worsening dyspnea and decrease functional mobile capacity she was brought to the hospital for further evaluation.  On the initial physical examination her blood pressure was [id number] to [id number] systolic. Her lungs had no wheezing and heart S1-S2 present and rhythmic. Abdomen was soft. Her cr was about 2.7 and K at 3.9. Chest film showed bilateral insterstisial changed suggesting pulmonary edema and bilateral pleural effusions.   Patient was admitted to the hospital with working diagnosis of heart failure decompensation complicated with cardiogenic pulmonary edema.     Hospital Course:     1. Cardiovascular. Patient responded well to diuresis with furosemide IV, then changed to torsemide, blood pressure regimen was modified to hydralazine 25 mg bid, metoprolol 25 mg po bid. Overall fluid balance since admission negative 11,810. No on ace inh at home. Echocardiogram with EF 65 to 70% with hypertrophic cardiomyopathy. Will plan to resume ace inh as out patient  when gfr stable.  2. Pulmonary. Patient responded well to diuresis, follow chest film showed improvement in infiltrates, will continue home diuresis with torsemide.  3. Nephrology, Renal function remained stable with cr at 1.8 to 2.1 over last 24 hours with K at 4.2. Clinically more euvolemic, will continue diuresis with torsemide and close follow up of renal function as outpatient. Will hold on potassium supplements to prevent hyperKalemia.  Discharge Condition:  Stable.  Follow UP  Follow-up Information    Follow up with [facility].   Specialty:  [place]   Why:  They will do your home health care at your home   Contact information:   [address] [place] [address] [phone]       [facility] with [facility], MD On 01/19/2016.   Specialty:  Internal Medicine   Why:  At 8:30am..... for hospital follow up   Contact information:   [address] [place]  [address] [phone]        Consults obtained -  Diet and Activity recommendation: See Discharge Instructions below  Discharge Instructions    Follow up with primary care in 7 days.      Discharge Medications       Medication List    STOP taking these medications        amLODipine 10 MG tablet  Commonly known as:  NORVASC     furosemide 80 MG tablet  Commonly known as:  LASIX      TAKE these medications        allopurinol 100 MG tablet  Commonly known as:  ZYLOPRIM  Take 100 mg by mouth 2 (two) times daily.     ALPRAZolam 0.5 MG tablet  Commonly known as:  XANAX  Take 0.5 mg by mouth 3 (three) times daily as needed. For anxiety     cilostazol 100 MG tablet  Commonly known as:  PLETAL  Take 100 mg by mouth 2 (two) times daily.     ferrous sulfate 325 (65 FE) MG tablet  Take 325 mg by mouth daily with breakfast.     hydrALAZINE 25 MG tablet  Commonly known as:  APRESOLINE  Take 1 tablet (25 mg total) by mouth 2 (two) times daily.     HYDROcodone-acetaminophen 10-325 MG tablet  Commonly known as:  NORCO  Take 1 tablet by mouth every 6 (six) hours as needed for severe pain.     metoprolol tartrate 25 MG tablet  Commonly known as:  LOPRESSOR  Take 1 tablet (25 mg total) by mouth 2 (two) times daily.     omeprazole 20 MG capsule  Commonly known as:  PRILOSEC  Take 20 mg by mouth daily.     torsemide 20 MG tablet  Commonly known as:  DEMADEX  Take 2 tablets (40 mg total) by mouth 2 (two) times daily.        Major procedures and Radiology Reports - PLEASE review detailed and final reports for all details, in brief -    Dg Chest 2 View  01/01/2016  CLINICAL DATA:  Shortness of breath. EXAM: CHEST  2 VIEW COMPARISON:  July 23, 2015. FINDINGS: Stable cardiomediastinal silhouette. No pneumothorax is noted. Interval placement of bilateral interstitial densities concerning for pulmonary edema with associated mild bilateral pleural effusions. Bony thorax  is unremarkable. IMPRESSION: Interval development of bilateral pulmonary edema with associated mild pleural effusions. Electronically Signed   By: Marijo Conception, M.D.  On: 01/01/2016 16:17   Dg Chest Port 1 View  01/06/2016  CLINICAL DATA:  Fever EXAM: PORTABLE CHEST 1 VIEW COMPARISON:  01/01/2016 FINDINGS: Cardiomegaly again noted. Improvement in aeration without pulmonary edema. Mild degenerative changes bilateral shoulders. Trace left pleural effusion with left basilar atelectasis or infiltrate. IMPRESSION: Improvement in aeration without pulmonary edema. Trace left pleural effusion with left basilar atelectasis or infiltrate. Degenerative changes bilateral shoulders. Electronically Signed   By: Lahoma Crocker M.D.   On: 01/06/2016 07:50    Micro Results    Recent Results (from the past 240 hour(s))  Culture, Urine     Status: Abnormal   Collection Time: 01/01/16  9:01 PM  Result Value Ref Range Status   Specimen Description URINE, RANDOM  Final   Special Requests NONE  Final   Culture MULTIPLE SPECIES PRESENT, SUGGEST RECOLLECTION (A)  Final   Report Status 01/03/2016 FINAL  Final  Culture, Urine     Status: Abnormal (Preliminary result)   Collection Time: 01/06/16  9:03 AM  Result Value Ref Range Status   Specimen Description URINE, RANDOM  Final   Special Requests NONE  Final   Culture >=100,000 COLONIES/mL ENTEROCOCCUS SPECIES (A)  Final   Report Status PENDING  Incomplete       Today   Subjective    [name] , is out of bed, dyspnea improved, no chest pain or palpitations, no nausea or vomiting, tolerating po well.   Objective   Blood pressure 152/35, pulse 72, temperature 99.1 F (37.3 C), temperature source Oral, resp. rate 16, height 5\' 5"  (1.651 m), weight 96.072 kg (211 lb 12.8 oz), SpO2 100 %.   Intake/Output Summary (Last 24 hours) at 01/07/16 1158 Last data filed at 01/07/16 0916  Gross per 24 hour  Intake   1160 ml  Output   1650 ml  Net   -490 ml     Exam Awake Alert, Oriented x 3, No new F.N deficits, Normal affect [place].AT,PERRAL Supple Neck,No JVD, No cervical lymphadenopathy appriciated.  Symmetrical Chest wall movement, Good air movement bilaterally, CTAB RRR,No Gallops,Rubs or new Murmurs, No Parasternal Heave +ve B.Sounds, Abd Soft, Non tender, No organomegaly appriciated, No rebound -guarding or rigidity. No Cyanosis, Clubbing or edema, No new Rash or bruise   Data Review   CBC w Diff: Lab Results  Component Value Date   WBC 7.0 01/07/2016   HGB 9.6* 01/07/2016   HCT 31.7* 01/07/2016   PLT 223 01/07/2016   LYMPHOPCT 16 07/29/2015   MONOPCT 1 07/29/2015   EOSPCT 1 07/29/2015   BASOPCT 0 07/29/2015    CMP: Lab Results  Component Value Date   NA 145 01/07/2016   K 4.2 01/07/2016   CL 100* 01/07/2016   CO2 30 01/07/2016   BUN 46* 01/07/2016   CREATININE 2.14* 01/07/2016   PROT 6.5 01/01/2016   ALBUMIN 3.4* 01/01/2016   BILITOT 0.3 01/01/2016   ALKPHOS 56 01/01/2016   AST 31 01/01/2016   ALT 31 01/01/2016  .   Total Time in preparing paper work, data evaluation and todays exam - 35 minutes  Tawni Millers M.D on 01/07/2016 at 11:58 AM  [facility]   Office  [phone]     Late entry: Urine culture positive for enterococcus but urine analysis with no pyuria, patient with no urinary symptoms.

## 2016-01-07 NOTE — Progress Notes (Signed)
Physical Therapy Treatment Patient Details Name: MURSAL SELL MRN: [id number] DOB: [birth year] Today's Date: 01/07/2016    History of Present Illness Patient is an 82 yo female admitted 01/01/16 with SOB.  Patient with CHF and intermittent abdominal pain.   PMH:  HTN, HLD, CKD, CHF, DVT, PVD with claudication, Bil TKA (wears knee braces), anemia    PT Comments    Pt progressing well. Family able to provide needed level of assist at home.  Follow Up Recommendations  Home health PT;Supervision for mobility/OOB     Equipment Recommendations  None recommended by PT    Recommendations for Other Services       Precautions / Restrictions Precautions Precautions: Fall Precaution Comments: O2 d/c'd Required Braces or Orthoses: Other Brace/Splint Other Brace/Splint: bil neoprene knee braces    Mobility  Bed Mobility     Rolling: Modified independent (Device/Increase time)   Supine to sit: Supervision     General bed mobility comments: +rail  Transfers   Equipment used: Rolling walker (2 wheeled)   Sit to Stand: Supervision         General transfer comment: Pt demo good technique.  Ambulation/Gait Ambulation/Gait assistance: Supervision Ambulation Distance (Feet): 150 Feet Assistive device: Rolling walker (2 wheeled) Gait Pattern/deviations: Step-through pattern;Decreased stride length Gait velocity: mildly decreased   General Gait Details: pt upright and proper use of RW   Stairs            Wheelchair Mobility    Modified Rankin (Stroke Patients Only)       Balance                                    Cognition Arousal/Alertness: Awake/alert Behavior During Therapy: WFL for tasks assessed/performed Overall Cognitive Status: Within Functional Limits for tasks assessed                      Exercises      General Comments        Pertinent Vitals/Pain Pain Assessment: No/denies pain    Home Living                       Prior Function            PT Goals (current goals can now be found in the care plan section) Acute Rehab PT Goals Patient Stated Goal: To get stronger; go home PT Goal Formulation: With patient Time For Goal Achievement: 01/16/16 Potential to Achieve Goals: Good Progress towards PT goals: Progressing toward goals    Frequency  Min 3X/week    PT Plan Current plan remains appropriate    Co-evaluation             End of Session Equipment Utilized During Treatment: Gait belt Activity Tolerance: Patient tolerated treatment well Patient left: in bed;with family/visitor present;with call bell/phone within reach     Time: 0922-0935 PT Time Calculation (min) (ACUTE ONLY): 13 min  Charges:  $Gait Training: 8-22 mins                    G Codes:      Lorriane Shire 01/07/2016, 9:37 AM

## 2016-01-07 NOTE — Care Management Important Message (Signed)
Important Message  Patient Details  Name: [name] MRN: [id number] Date of Birth: 1929/02/24   Medicare Important Message Given:  Yes    Yanai Hobson P Eknoor Novack 01/07/2016, 1:39 PM

## 2016-01-07 NOTE — Discharge Instructions (Signed)
Continue taking torsemide, hydralazine and metoprolol as instructed, follow with Primary care in 7 days.

## 2016-01-08 LAB — URINE CULTURE: Culture: >=100000 — AB

## 2016-01-11 LAB — CULTURE, BLOOD (ROUTINE X 2)
Culture: NO GROWTH
Culture: NO GROWTH

## 2016-01-14 NOTE — Progress Notes (Signed)
Cardiology Office Note:    Date:  01/15/2016   ID:  [name], DOB 1928/12/12, MRN [id number]  PCP:  Antonietta Jewel, MD  Cardiologist:  New - seen today with Dr. Allegra Lai   Electrophysiologist:  N/a Nephrologist: Dr. Moshe Cipro   Referring MD: Antonietta Jewel, MD   Chief Complaint  Patient presents with  . Hospitalization Follow-up    CHF    History of Present Illness:     [name] is a 82 y.o. female with a hx of HTN, HL, CKD, prior DVT.    Admitted 5/4-5/10 with acute diastolic CHF. Chest x-ray demonstrated changes consistent with poor edema. She was diuresed with IV Lasix. She was not followed by cardiology. Echocardiogram demonstrated moderate LVH, normal LV function and moderate to severe diastolic dysfunction. She was placed on torsemide for continued fluid management. Of note, she had minimally elevated troponins without trend. She is referred to cardiology for follow-up.  Here today with her daughters.  She is doing well since DC.  Breathing is improved.  However, she still has some DOE.  She is NYHA 2b.  Denies orthopnea, PND, edema.  No cough or wheezing.  She has abdominal pain.  No vomiting.  Denies chest pain.  Denies syncope.  BP has been borderline at home. She has been taking Hydralazine 50 mg QD instead of 25 mg bid.  She sees her PCP next week and Nephrology early next month.  Weights have been stable since DC.  She is watching her fluid and salt intake.    Past Medical History  Diagnosis Date  . Hypertension   . Hypercholesteremia   . Arthritis   . Heart murmur     long time ago--no problems  . Chronic kidney disease     had some renal insufficiency.  Sees Dr. Moshe Cipro who took her off benicar in 2013.               Past Surgical History  Procedure Laterality Date  . Joint replacement      both knees  . Failed knee surgery      on the right x 2  . Cataract extraction w/phaco Right 03/20/2014    Procedure: CATARACT EXTRACTION PHACO AND  INTRAOCULAR LENS PLACEMENT (IOC);  Surgeon: [name], MD;  Location: [place];  Service: Ophthalmology;  Laterality: Right;  . Left ankle orif      Current Medications: Outpatient Prescriptions Prior to Visit  Medication Sig Dispense Refill  . allopurinol (ZYLOPRIM) 100 MG tablet Take 100 mg by mouth 2 (two) times daily.  3  . ALPRAZolam (XANAX) 0.5 MG tablet Take 0.5 mg by mouth 3 (three) times daily as needed. For anxiety    . ferrous sulfate 325 (65 FE) MG tablet Take 325 mg by mouth daily with breakfast.    . HYDROcodone-acetaminophen (NORCO) 10-325 MG per tablet Take 1 tablet by mouth every 6 (six) hours as needed for severe pain.   0  . metoprolol tartrate (LOPRESSOR) 25 MG tablet Take 1 tablet (25 mg total) by mouth 2 (two) times daily. 60 tablet 0  . omeprazole (PRILOSEC) 20 MG capsule Take 20 mg by mouth daily.     Marland Kitchen torsemide (DEMADEX) 20 MG tablet Take 2 tablets (40 mg total) by mouth 2 (two) times daily. 60 tablet 0  . cilostazol (PLETAL) 100 MG tablet Take 100 mg by mouth 2 (two) times daily.   3  . hydrALAZINE (APRESOLINE) 25 MG tablet Take 1 tablet (25  mg total) by mouth 2 (two) times daily. 60 tablet 0   No facility-administered medications prior to visit.      Allergies:   Naprosyn   Social History   Social History  . Marital Status: Single    Spouse Name: N/A  . Number of Children: N/A  . Years of Education: N/A   Social History Main Topics  . Smoking status: Never Smoker   . Smokeless tobacco: None  . Alcohol Use: 0.6 oz/week    1 Standard drinks or equivalent per week     Comment: occasional  . Drug Use: No  . Sexual Activity: Not Asked   Other Topics Concern  . 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 There is no history of Heart attack.   ROS:   Please see the history of present illness.    Review of Systems  Musculoskeletal: Positive for joint pain.  Gastrointestinal: Positive for abdominal pain.   All other systems reviewed and are negative.   Physical Exam:    VS:  BP 92/40 mmHg  Pulse 72  Ht 5' 5.5" (1.664 m)  Wt 212 lb 6.4 oz (96.344 kg)  BMI 34.80 kg/m2  SpO2 96%   GEN: Well nourished, well developed, in no acute distress HEENT: normal Neck: no JVD, no masses Cardiac: Normal S1/S2, RRR; no murmurs, rubs, or gallops, no edema;     Respiratory:  clear to auscultation bilaterally; no wheezing, rhonchi or rales GI: soft, nontender, nondistended MS: no deformity or atrophy Skin: warm and dry Neuro: No focal deficits  Psych: Alert and oriented x 3, normal affect  Wt Readings from Last 3 Encounters:  01/15/16 212 lb 6.4 oz (96.344 kg)  01/07/16 211 lb 12.8 oz (96.072 kg)  07/23/15 231 lb (104.781 kg)      Studies/Labs Reviewed:     EKG:  EKG is  ordered today.  The ekg ordered today demonstrates NSR, HR 74, normal axis, early repolarization, QTc 457 ms  Recent Labs: 01/01/2016: ALT 31; B Natriuretic Peptide 1032.3*; TSH 2.008 01/05/2016: Magnesium 2.2 01/07/2016: BUN 46*; Creatinine, Ser 2.14*; Hemoglobin 9.6*; Platelets 223; Potassium 4.2; Sodium 145   Recent Lipid Panel No results found for: CHOL, TRIG, HDL, CHOLHDL, VLDL, LDLCALC, LDLDIRECT  Additional studies/ records that were reviewed today include:   Echo 01/02/16 Moderate concentric LVH, vigorous LV function, EF 65-70%, normal wall motion, grade 3 diastolic dysfunction, calcified aortic valve, MAC, mild MR, mild LAE  Myoview 2/02 IMPRESSION 1. NORMAL PERFUSION. NO ISCHEMIA. 2. EJECTION FRACTION IS 71%.  ASSESSMENT:     1. Chronic diastolic heart failure ([place])   2. Hypertensive heart disease with CHF ([place])   3. CKD (chronic kidney disease), stage IV  (HCC)     PLAN:     In order of problems listed above:  1. Chronic diastolic CHF - NYHA 2b.  She is improved since DC.  However, she is still short of breath with activity.  She is not having chest pain. BP low today b/c she took too much Hydralazine. If BP improves and DOE continues could add nitrates to medical regimen.  She is not  a candidate for invasive ischemic evaluation.  We discussed the dangers of cardiac cath in someone with stage 4 CKD. We discussed the need to limit salt and restrict fluid intake. She knows to call if her weight increases 3 lbs in 1 day.    -  Continue current Rx  -  BMET today  2. HTN Heart Disease - Mod LVH on echo with grade 3 diastolic dysfunction.  BP controlled. Change Hydralazine to 25 mg bid as previously prescribed.    3. CKD - FU with Dr. Louie Boston as planned.    4. Hx of DVT - No indication for Pletal to prevent DVTs.  Pletal contraindicated in patients with CHF.  -  DC Pletal.    Medication Adjustments/Labs and Tests Ordered: Current medicines are reviewed at length with the patient today.  Concerns regarding medicines are outlined above.  Medication changes, Labs and Tests ordered today are outlined in the Patient Instructions noted below. Patient Instructions  Medication Instructions:  1. CHANGE OVER TO HYDRALAZINE 25 MG TABLET WITH THE DIRECTIONS TO READ: TAKE 1 TABLET TWICE DAILY; NEW RX SENT IN TODAY 2. STOP PLETAL Labwork: TODAY BMET Testing/Procedures: NONE Follow-Up: Richardson Dopp, [facility] 02/20/16 @ 10:15 Any Other Special Instructions Will Be Listed Below (If Applicable). If you need a refill on your cardiac medications before your next appointment, please call your pharmacy.    Signed, Richardson Dopp, PA-C  01/15/2016 10:33 AM    [facility] [place], [place], [place]  [address] Phone: [phone]; Fax: [phone]    I have seen and examined this patient with Richardson Dopp.  Agree with above,  note added to reflect my findings.  On exam, regular rhythm, no murmurs, lungs clear.   Had admission for diastolic HF.  Currently is feeling much better after significant diuresis in the hospital.  No medication changes at this time other than stopping Pletal.   Will M. Camnitz MD 01/15/2016 10:36 AM

## 2016-01-15 ENCOUNTER — Telehealth: Payer: Self-pay | Admitting: *Deleted

## 2016-01-15 ENCOUNTER — Ambulatory Visit (INDEPENDENT_AMBULATORY_CARE_PROVIDER_SITE_OTHER): Payer: Medicare HMO | Admitting: Physician Assistant

## 2016-01-15 ENCOUNTER — Encounter: Payer: Self-pay | Admitting: Physician Assistant

## 2016-01-15 VITALS — BP 92/40 | HR 72 | Ht 65.5 in | Wt 212.4 lb

## 2016-01-15 DIAGNOSIS — N184 Chronic kidney disease, stage 4 (severe): Secondary | ICD-10-CM | POA: Diagnosis not present

## 2016-01-15 DIAGNOSIS — I5032 Chronic diastolic (congestive) heart failure: Secondary | ICD-10-CM | POA: Diagnosis not present

## 2016-01-15 DIAGNOSIS — I110 Hypertensive heart disease with heart failure: Secondary | ICD-10-CM

## 2016-01-15 LAB — BASIC METABOLIC PANEL
BUN: 50 mg/dL — ABNORMAL HIGH (ref 7–25)
CHLORIDE: 97 mmol/L — AB (ref 98–110)
CO2: 24 mmol/L (ref 20–31)
Calcium: 9.5 mg/dL (ref 8.6–10.4)
Creat: 2.9 mg/dL — ABNORMAL HIGH (ref 0.60–0.88)
GLUCOSE: 118 mg/dL — AB (ref 65–99)
POTASSIUM: 5.7 mmol/L — AB (ref 3.5–5.3)
SODIUM: 134 mmol/L — AB (ref 135–146)

## 2016-01-15 MED ORDER — HYDRALAZINE HCL 25 MG PO TABS: 25.0000 mg | ORAL_TABLET | Freq: Two times a day (BID) | ORAL | Status: DC

## 2016-01-15 MED ORDER — TORSEMIDE 20 MG PO TABS: 40.0000 mg | ORAL_TABLET | ORAL | Status: DC

## 2016-01-15 NOTE — Patient Instructions (Addendum)
Medication Instructions:  1. CHANGE OVER TO HYDRALAZINE 25 MG TABLET WITH THE DIRECTIONS TO READ: TAKE 1 TABLET TWICE DAILY; NEW RX SENT IN TODAY 2. STOP PLETAL Labwork: TODAY BMET Testing/Procedures: NONE Follow-Up: Richardson Dopp, [facility] 02/20/16 @ 10:15 Any Other Special Instructions Will Be Listed Below (If Applicable). If you need a refill on your cardiac medications before your next appointment, please call your pharmacy.

## 2016-01-15 NOTE — Telephone Encounter (Signed)
Pt notified of lab results and to decrease Torsemide to 20 mg BID x 3 days then change to Torsemide 40 mg AM/20 mg PM. STAT BMET 5/19, K+ 5.7 though possibly hemolyzed. Pt see's Brynda Rim. PA 5/23 may repeat bmet then as well. Pt read back all instructions

## 2016-01-16 ENCOUNTER — Other Ambulatory Visit (INDEPENDENT_AMBULATORY_CARE_PROVIDER_SITE_OTHER): Payer: Medicare HMO | Admitting: *Deleted

## 2016-01-16 ENCOUNTER — Telehealth: Payer: Self-pay | Admitting: Physician Assistant

## 2016-01-16 ENCOUNTER — Telehealth: Payer: Self-pay | Admitting: *Deleted

## 2016-01-16 DIAGNOSIS — N184 Chronic kidney disease, stage 4 (severe): Secondary | ICD-10-CM

## 2016-01-16 DIAGNOSIS — I5032 Chronic diastolic (congestive) heart failure: Secondary | ICD-10-CM | POA: Diagnosis not present

## 2016-01-16 LAB — BASIC METABOLIC PANEL
Anion gap: 16 — ABNORMAL HIGH (ref 5–15)
BUN: 54 mg/dL — AB (ref 6–20)
CHLORIDE: 98 mmol/L — AB (ref 101–111)
CO2: 25 mmol/L (ref 22–32)
CREATININE: 2.97 mg/dL — AB (ref 0.44–1.00)
Calcium: 9.9 mg/dL (ref 8.9–10.3)
GFR calc Af Amer: 16 mL/min — ABNORMAL LOW (ref >60)
GFR calc non Af Amer: 13 mL/min — ABNORMAL LOW (ref >60)
Glucose, Bld: 114 mg/dL — ABNORMAL HIGH (ref 65–99)
Potassium: 4.6 mmol/L (ref 3.5–5.1)
SODIUM: 139 mmol/L (ref 135–145)

## 2016-01-16 NOTE — Addendum Note (Signed)
Addended by: Eulis Foster on: 01/16/2016 10:52 AM   Modules accepted: Orders

## 2016-01-16 NOTE — Telephone Encounter (Signed)
New message      Talk to the nurse about patient's medication change

## 2016-01-16 NOTE — Telephone Encounter (Signed)
DPR on file for daughter [name] who states her mother (pt) had leg cramps last night since we d/c'd Pletal yesterday. She wanted to know if there was something else that the pt could take for leg cramps. Advised I will d/w PA. STAT BMET today.

## 2016-01-16 NOTE — Telephone Encounter (Signed)
Pt notified that her K+ is normal. I also advised pt per Brynda Rim. PA suggest for her leg cramps to try a spoonful of mustard before bedtime to see if this helps. Pt is agreeable to plan of care.

## 2016-02-04 ENCOUNTER — Encounter: Payer: Self-pay | Admitting: Gastroenterology

## 2016-02-11 ENCOUNTER — Encounter: Payer: Self-pay | Admitting: Physician Assistant

## 2016-02-17 ENCOUNTER — Emergency Department (HOSPITAL_COMMUNITY)
Admission: EM | Admit: 2016-02-17 | Discharge: 2016-02-17 | Disposition: A | Discharge status: Home / Self Care | Payer: Medicare HMO | Attending: Emergency Medicine | Admitting: Emergency Medicine

## 2016-02-17 ENCOUNTER — Encounter (HOSPITAL_COMMUNITY): Payer: Self-pay

## 2016-02-17 ENCOUNTER — Emergency Department (HOSPITAL_COMMUNITY): Payer: Medicare HMO

## 2016-02-17 DIAGNOSIS — E8779 Other fluid overload: Secondary | ICD-10-CM

## 2016-02-17 DIAGNOSIS — I130 Hypertensive heart and chronic kidney disease with heart failure and stage 1 through stage 4 chronic kidney disease, or unspecified chronic kidney disease: Secondary | ICD-10-CM | POA: Diagnosis not present

## 2016-02-17 DIAGNOSIS — N184 Chronic kidney disease, stage 4 (severe): Secondary | ICD-10-CM | POA: Insufficient documentation

## 2016-02-17 DIAGNOSIS — I10 Essential (primary) hypertension: Secondary | ICD-10-CM

## 2016-02-17 DIAGNOSIS — M1990 Unspecified osteoarthritis, unspecified site: Secondary | ICD-10-CM | POA: Diagnosis not present

## 2016-02-17 DIAGNOSIS — R109 Unspecified abdominal pain: Secondary | ICD-10-CM | POA: Insufficient documentation

## 2016-02-17 DIAGNOSIS — R0602 Shortness of breath: Secondary | ICD-10-CM | POA: Diagnosis present

## 2016-02-17 DIAGNOSIS — I5032 Chronic diastolic (congestive) heart failure: Secondary | ICD-10-CM | POA: Insufficient documentation

## 2016-02-17 DIAGNOSIS — Z79899 Other long term (current) drug therapy: Secondary | ICD-10-CM | POA: Diagnosis not present

## 2016-02-17 LAB — COMPREHENSIVE METABOLIC PANEL
ALK PHOS: 65 U/L (ref 38–126)
ALT: 16 U/L (ref 14–54)
ANION GAP: 12 (ref 5–15)
AST: 25 U/L (ref 15–41)
Albumin: 3.6 g/dL (ref 3.5–5.0)
BILIRUBIN TOTAL: 0.5 mg/dL (ref 0.3–1.2)
BUN: 55 mg/dL — ABNORMAL HIGH (ref 6–20)
CALCIUM: 9.3 mg/dL (ref 8.9–10.3)
CO2: 30 mmol/L (ref 22–32)
CREATININE: 2.75 mg/dL — AB (ref 0.44–1.00)
Chloride: 96 mmol/L — ABNORMAL LOW (ref 101–111)
GFR calc non Af Amer: 15 mL/min — ABNORMAL LOW (ref >60)
GFR, EST AFRICAN AMERICAN: 17 mL/min — AB (ref >60)
GLUCOSE: 97 mg/dL (ref 65–99)
Potassium: 4 mmol/L (ref 3.5–5.1)
SODIUM: 138 mmol/L (ref 135–145)
TOTAL PROTEIN: 7.2 g/dL (ref 6.5–8.1)

## 2016-02-17 LAB — CBC
HCT: 37.9 % (ref 36.0–46.0)
Hemoglobin: 11.5 g/dL — ABNORMAL LOW (ref 12.0–15.0)
MCH: 26 pg (ref 26.0–34.0)
MCHC: 30.3 g/dL (ref 30.0–36.0)
MCV: 85.6 fL (ref 78.0–100.0)
PLATELETS: 182 10*3/uL (ref 150–400)
RBC: 4.43 MIL/uL (ref 3.87–5.11)
RDW: 16.3 % — ABNORMAL HIGH (ref 11.5–15.5)
WBC: 6.4 10*3/uL (ref 4.0–10.5)

## 2016-02-17 LAB — URINALYSIS, ROUTINE W REFLEX MICROSCOPIC
BILIRUBIN URINE: NEGATIVE
Glucose, UA: NEGATIVE mg/dL
HGB URINE DIPSTICK: NEGATIVE
Ketones, ur: NEGATIVE mg/dL
Leukocytes, UA: NEGATIVE
NITRITE: NEGATIVE
PROTEIN: NEGATIVE mg/dL
Specific Gravity, Urine: 1.01 (ref 1.005–1.030)
pH: 6 (ref 5.0–8.0)

## 2016-02-17 LAB — LIPASE, BLOOD: Lipase: 23 U/L (ref 11–51)

## 2016-02-17 LAB — BRAIN NATRIURETIC PEPTIDE: B NATRIURETIC PEPTIDE 5: 293.3 pg/mL — AB (ref 0.0–100.0)

## 2016-02-17 LAB — TROPONIN I

## 2016-02-17 MED ORDER — FUROSEMIDE 10 MG/ML IJ SOLN
80.0000 mg | Freq: Once | INTRAMUSCULAR | Status: AC
  Administered 2016-02-17: 80 mg via INTRAVENOUS
  Filled 2016-02-17: qty 8

## 2016-02-17 MED ORDER — HYDROCODONE-ACETAMINOPHEN 5-325 MG PO TABS
2.0000 | ORAL_TABLET | Freq: Once | ORAL | Status: AC
  Administered 2016-02-17: 2 via ORAL
  Filled 2016-02-17: qty 2

## 2016-02-17 NOTE — ED Provider Notes (Signed)
CSN: [id number]     Arrival date & time 02/17/16  1342 History   First MD Initiated Contact with Patient 02/17/16 1608     Chief Complaint  Patient presents with  . Shortness of Breath  . Abdominal Pain     (Consider location/radiation/quality/duration/timing/severity/associated sxs/prior Treatment) Patient is a 82 y.o. female presenting with shortness of breath and abdominal pain.  Shortness of Breath Severity:  Mild Onset quality:  Gradual Duration:  2 weeks Timing:  Constant Progression:  Worsening Chronicity:  Recurrent Context: activity   Relieved by:  None tried Worsened by:  Movement Ineffective treatments:  None tried Associated symptoms: abdominal pain   Associated symptoms: no chest pain, no cough and no vomiting   Abdominal Pain Associated symptoms: shortness of breath   Associated symptoms: no chest pain, no constipation, no cough, no diarrhea, no nausea and no vomiting     Past Medical History  Diagnosis Date  . Hypertension   . Hypercholesteremia   . Arthritis   . Heart murmur     long time ago--no problems  . Chronic kidney disease     had some renal insufficiency.  Sees Dr. Moshe Cipro who took her off benicar in 2013.             Marland Kitchen CHF (congestive heart failure) [facility])    Past Surgical History  Procedure Laterality Date  . Joint replacement      both knees  . Failed knee surgery      on the right x 2  . Cataract extraction w/phaco Right 03/20/2014    Procedure: CATARACT EXTRACTION PHACO AND INTRAOCULAR LENS PLACEMENT (IOC);  Surgeon: [name], MD;  Location: [place];  Service: Ophthalmology;  Laterality: Right;  . Left ankle orif     Family History  Problem Relation Age of Onset  . Diabetes Mellitus II Daughter   . Hypertension Daughter   . Hypertension Mother   . Stroke Father   . Heart attack Neg Hx    Social History  Substance Use Topics  . Smoking status: Never Smoker   . Smokeless tobacco: None  . Alcohol Use: 0.6 oz/week    1  Standard drinks or equivalent per week     Comment: occasional   OB History    No data available     Review of Systems  Respiratory: Positive for shortness of breath. Negative for cough and chest tightness.   Cardiovascular: Positive for leg swelling. Negative for chest pain.  Gastrointestinal: Positive for abdominal pain. Negative for nausea, vomiting, diarrhea and constipation.  All other systems reviewed and are negative.     Allergies  Naprosyn  Home Medications   Prior to Admission medications   Medication Sig Start Date End Date Taking? Authorizing Provider  allopurinol (ZYLOPRIM) 100 MG tablet Take 100 mg by mouth 2 (two) times daily. 12/24/15  Yes Historical Provider, MD  ALPRAZolam Duanne Moron) 0.5 MG tablet Take 0.5 mg by mouth 3 (three) times daily as needed. For anxiety   Yes Historical Provider, MD  ferrous sulfate 325 (65 FE) MG tablet Take 325 mg by mouth daily with breakfast.   Yes Historical Provider, MD  hydrALAZINE (APRESOLINE) 25 MG tablet Take 1 tablet (25 mg total) by mouth 2 (two) times daily. Patient taking differently: Take 50 mg by mouth 3 (three) times daily.  01/15/16  Yes Scott Joylene Draft, PA-C  HYDROcodone-acetaminophen (NORCO) 10-325 MG per tablet Take 1 tablet by mouth every 6 (six) hours as needed for severe  pain.  10/02/14  Yes Historical Provider, MD  levofloxacin (LEVAQUIN) 250 MG tablet TK 1 T PO QD 02/12/16  Yes Historical Provider, MD  metoprolol tartrate (LOPRESSOR) 25 MG tablet Take 1 tablet (25 mg total) by mouth 2 (two) times daily. 01/07/16  Yes Mauricio Gerome Apley, MD  omeprazole (PRILOSEC) 20 MG capsule Take 20 mg by mouth daily.    Yes Historical Provider, MD  promethazine (PHENERGAN) 12.5 MG tablet TK 1 T PO TID PRN FOR NAUSEA  AND VOMITING 01/27/16  Yes Historical Provider, MD  torsemide (DEMADEX) 20 MG tablet Take 2 tablets (40 mg total) by mouth as directed. 20 mg BID x 3 days; then change to 40 mg AM/20 mg PM Patient taking differently: Take  40 mg by mouth 2 (two) times daily. 20 mg BID x 3 days; then change to 40 mg AM/20 mg PM 01/15/16  Yes Scott T Weaver, PA-C   BP 153/42 mmHg  Pulse 62  Temp(Src) 99 F (37.2 C) (Oral)  Resp 21  SpO2 95% Physical Exam  Constitutional: She appears well-developed and well-nourished.  HENT:  Head: Normocephalic and atraumatic.  Neck: Normal range of motion.  Cardiovascular: Normal rate and regular rhythm.  Exam reveals no gallop and no friction rub.   No murmur heard. Mild JVD  Pulmonary/Chest: Effort normal. No stridor. No respiratory distress. She has rales (bilaterally halfway up).  Abdominal: She exhibits no distension. There is tenderness (suprapbuci).  Neurological: She is alert.  Nursing note and vitals reviewed.   ED Course  Procedures (including critical care time) Labs Review Labs Reviewed  COMPREHENSIVE METABOLIC PANEL - Abnormal; Notable for the following:    Chloride 96 (*)    BUN 55 (*)    Creatinine, Ser 2.75 (*)    GFR calc non Af Amer 15 (*)    GFR calc Af Amer 17 (*)    All other components within normal limits  CBC - Abnormal; Notable for the following:    Hemoglobin 11.5 (*)    RDW 16.3 (*)    All other components within normal limits  BRAIN NATRIURETIC PEPTIDE - Abnormal; Notable for the following:    B Natriuretic Peptide 293.3 (*)    All other components within normal limits  LIPASE, BLOOD  URINALYSIS, ROUTINE W REFLEX MICROSCOPIC (NOT AT [facility])  TROPONIN I    Imaging Review Dg Chest 2 View  02/17/2016  CLINICAL DATA:  Hx CHF - pt states increasing SOB and fluid gain - hx hypertension - nonsmoker EXAM: CHEST - 2 VIEW COMPARISON:  01/06/2016 FINDINGS: Lungs are clear. Mild cardiomegaly stable.  Atheromatous aorta. No effusion. Anterior vertebral endplate spurring at multiple levels in the mid and lower thoracic spine. Degenerative changes in bilateral shoulders. IMPRESSION: 1. Stable mild cardiomegaly.  No acute abnormality. 2.  Aortic Atherosclerosis  (ICD10-170.0) Electronically Signed   By: Lucrezia Europe M.D.   On: 02/17/2016 17:15   Ct Renal Stone Study  02/17/2016  ADDENDUM REPORT: 02/17/2016 19:05 ADDENDUM: There is an apparent 4-5 mm distal right ureteral stone that is stable from 2016, without evidence for obstruction. This is unlikely to explain the patient's symptoms. There is no evidence of hydronephrosis. Electronically Signed   By: Garald Balding M.D.   On: 02/17/2016 19:05  02/17/2016  CLINICAL DATA:  Acute onset of left flank pain and difficulty urinating. Initial encounter. EXAM: CT ABDOMEN AND PELVIS WITHOUT CONTRAST TECHNIQUE: Multidetector CT imaging of the abdomen and pelvis was performed following the standard protocol without  IV contrast. COMPARISON:  CT of the abdomen and pelvis from 11/21/2014 FINDINGS: Mild bibasilar atelectasis or scarring is noted. The liver and spleen are unremarkable in appearance. Stones are noted within the gallbladder. The gallbladder is otherwise unremarkable. The pancreas and adrenal glands are unremarkable. Minimal left renal atrophy is noted. There is no evidence of hydronephrosis. No renal or ureteral stones are seen. No perinephric stranding is appreciated. No free fluid is identified. The small bowel is unremarkable in appearance. The stomach is within normal limits. No acute vascular abnormalities are seen. Diffuse calcification is seen along the abdominal aorta and its branches. The appendix is normal in caliber and contains air, without evidence of appendicitis. The colon is grossly unremarkable in appearance. The bladder is mildly distended and grossly unremarkable. The uterus is unremarkable in appearance. The ovaries are grossly symmetric. No suspicious adnexal masses are seen. No inguinal lymphadenopathy is seen. No acute osseous abnormalities are identified. Mild facet disease is noted along the lower lumbar spine, with minimal grade 1 anterolisthesis of L4 on L5. Vacuum phenomenon is noted along the  lower thoracic spine. IMPRESSION: 1. No acute abnormality seen within the abdomen or pelvis. 2. Diffuse calcification along the abdominal aorta and its branches. 3. Cholelithiasis.  Gallbladder otherwise unremarkable. 4. Minimal left renal atrophy noted. 5. Mild degenerative change along the lower thoracic and lower lumbar spine. Electronically Signed: By: Garald Balding M.D. On: 02/17/2016 19:01   I have personally reviewed and evaluated these images and lab results as part of my medical decision-making.   EKG Interpretation None      MDM   Final diagnoses:  Hypertension with fluid overload   Likely chf exacerbation. But also with acute worsening of chronic abdominal pain. Likely related to cystitis, but will also check to ensure no stone, pancreatitis, colitis, diverticulitis as the cause.  Ct scan ok. Labs and imagine appear to be c/w mild chf exacerbation without indication for admission, so I will give a dose of IV lasix here and double torsemide at home for the time being. Has cardiology follow up on Friday which will be helpful for reevaluation.   New Prescriptions: Discharge Medication List as of 02/17/2016  8:49 PM      I have personally and contemperaneously reviewed labs and imaging and used in my decision making as above.   A medical screening exam was performed and I feel the patient has had an appropriate workup for their chief complaint at this time and likelihood of emergent condition existing is low and thus workup can continue on an outpatient basis.. Their vital signs are stable. They have been counseled on decision, discharge, follow up and which symptoms necessitate immediate return to the emergency department.  They verbally stated understanding and agreement with plan and discharged in stable condition.      Merrily Pew, MD 02/18/16 1110

## 2016-02-17 NOTE — ED Notes (Signed)
Attempted blood draw with no success.     

## 2016-02-17 NOTE — Discharge Instructions (Signed)
Please double the amount of torsemide you are taking for the next 3 days then discuss with your cardiologist further workup. If you have worsening distension, shortness of breath, abdominal pain, chest pain or other concerning symptoms return here immediately.

## 2016-02-17 NOTE — ED Notes (Signed)
Family at bedside. 

## 2016-02-17 NOTE — Progress Notes (Signed)
EDCM spoke to patient ad her daughters at bedside.  Patient lies at home.  Patient's grand daughter lives with her.  Patient's daughter lives nearby and checks in on patient frequently.  Patient confirms she currently has home health services with [facility] for RN.  She reports PT and OT have stopped.  Patient reports she is able to ambulate to the bathroom at home with her cane.  Patient also has a walker and access to a bedside commode if needed.  Patient confirms her pcp is Dr. Sheryle Hail.  She reports she last saw her pcp last Tuesday.  Patient reports she does not require any further dme at home.  EDCM informed patient and her family that if she required further home health services in the future it may be arranged through her pcp.  Patient verbalized understanding.  Patient reports she is having pain in her abdomen and her back.  EDRN made aware.  Patient thankful for services.  No further EDCM needs at this time.

## 2016-02-17 NOTE — ED Notes (Signed)
Pt dx with CHF in May.  Pt has home health and weighed frequently.  Pt is increasing in weight with shortness of breath.

## 2016-02-19 NOTE — Progress Notes (Addendum)
Cardiology Office Note:    Date:  02/20/2016   ID:  [name], DOB [birth year], MRN [id number]  PCP:  Antonietta Jewel, MD  Cardiologist:  Dr. Allegra Lai //  Richardson Dopp, PA-C  Electrophysiologist: N/a Nephrologist: Dr. Moshe Cipro  Referring MD: Antonietta Jewel, MD   Chief Complaint  Patient presents with  . Congestive Heart Failure    Follow up  . Hospitalization Follow-up    In ED 6/20 with CHF    History of Present Illness:     AMILLI CHIODI is a 82 y.o. female with a hx of HTN, HL, CKD, prior DVT.   Admitted in 5/17 with acute diastolic CHF.  Echocardiogram demonstrated moderate LVH, normal LV function and moderate to severe diastolic dysfunction. She had minimally elevated troponins without trend.    I saw her initially in 5/17 for cardiology evaluation.  Her volume was improved.  I reviewed her case with Dr. Allegra Lai who was the DOD that day and we [place] her Pletal given her recent dx of CHF.    Seen in the ED 6/20 with abdominal pain and dyspnea.  CXR demonstrated no acute abnormality.  Abd CT was unremarkable for acute findings.  There was diff Ca along abd aorta and its branches, gallstones, L renal atrophy, thoracic and lumbar DJD.  Renal stone CT was notable for R ureteral stone unchanged from 2016.  BNP was 293.  She was given 1 dose of IV Lasix for mild volume overload and her home Torsemide was increased for 3 days.    She returns for FU.  Here with her daughter. Since going to the emergency room, she is feeling better. Her weight has come back down. She is able a flat. She denies PND. LE edema is improved. She denies chest pain. Denies syncope or near syncope.   Past Medical History  Diagnosis Date  . Hypertensive heart and kidney disease with heart failure and with chronic kidney disease stage IV ([place])   . Hypercholesteremia   . Arthritis   . Heart murmur     long time ago--no problems  . CKD (chronic kidney disease)     Sees Dr. Moshe Cipro who  took her off benicar in 2013.             Marland Kitchen Chronic diastolic CHF (congestive heart failure) ([place])     a. Echo 01/02/16: Moderate concentric LVH, vigorous LV function, EF 65-70%, normal wall motion, grade 3 diastolic dysfunction, calcified aortic valve, MAC, mild MR, mild LAE    Past Surgical History  Procedure Laterality Date  . Joint replacement      both knees  . Failed knee surgery      on the right x 2  . Cataract extraction w/phaco Right 03/20/2014    Procedure: CATARACT EXTRACTION PHACO AND INTRAOCULAR LENS PLACEMENT (IOC);  Surgeon: [name], MD;  Location: [place];  Service: Ophthalmology;  Laterality: Right;  . Left ankle orif      Current Medications: Outpatient Prescriptions Prior to Visit  Medication Sig Dispense Refill  . allopurinol (ZYLOPRIM) 100 MG tablet Take 100 mg by mouth 2 (two) times daily.  3  . ALPRAZolam (XANAX) 0.5 MG tablet Take 0.5 mg by mouth 3 (three) times daily as needed. For anxiety    . ferrous sulfate 325 (65 FE) MG tablet Take 325 mg by mouth daily with breakfast.    . HYDROcodone-acetaminophen (NORCO) 10-325 MG per tablet Take 1 tablet by mouth every 6 (six) hours as  needed for severe pain.   0  . omeprazole (PRILOSEC) 20 MG capsule Take 20 mg by mouth daily.     . hydrALAZINE (APRESOLINE) 25 MG tablet Take 1 tablet (25 mg total) by mouth 2 (two) times daily. (Patient not taking: Reported on 02/20/2016) 60 tablet 11  . levofloxacin (LEVAQUIN) 250 MG tablet Reported on 02/20/2016  0  . metoprolol tartrate (LOPRESSOR) 25 MG tablet Take 1 tablet (25 mg total) by mouth 2 (two) times daily. (Patient not taking: Reported on 02/20/2016) 60 tablet 0  . promethazine (PHENERGAN) 12.5 MG tablet Reported on 02/20/2016  0  . torsemide (DEMADEX) 20 MG tablet Take 2 tablets (40 mg total) by mouth as directed. 20 mg BID x 3 days; then change to 40 mg AM/20 mg PM (Patient not taking: Reported on 02/20/2016)     No facility-administered medications prior to visit.       Allergies:   Naprosyn   Social History   Social History  . Marital Status: Single    Spouse Name: N/A  . Number of Children: N/A  . Years of Education: N/A   Social History Main Topics  . Smoking status: Never Smoker   . Smokeless tobacco: None  . Alcohol Use: 0.6 oz/week    1 Standard drinks or equivalent per week     Comment: occasional  . Drug Use: No  . Sexual Activity: Not Asked   Other Topics Concern  . 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 There is no history of Heart attack.   ROS:    Please see the history of present illness.    Review of Systems  Constitution: Positive for weight gain.  Cardiovascular: Positive for dyspnea on exertion.  Respiratory: Positive for shortness of breath.   Gastrointestinal: Positive for abdominal pain and nausea.   All other systems reviewed and are negative.   Physical Exam:    VS:  BP 118/30 mmHg  Pulse 60  Ht 5\' 5"  (1.651 m)  Wt 212 lb 12.8 oz (96.525 kg)  BMI 35.41 kg/m2   Physical Exam  Constitutional: She is oriented to person, place, and time. She appears well-developed and well-nourished.  HENT:  Head: Normocephalic and atraumatic.  Neck: No JVD present.  Cardiovascular: Normal rate, regular rhythm and normal heart sounds.   No murmur heard. Pulmonary/Chest: Effort normal and breath sounds normal. She has no wheezes. She has no rales.  Abdominal: Soft. There is no tenderness.  Musculoskeletal:  Trace bilateral LE edema  Neurological: She is alert and oriented to person, place, and time.  Skin: Skin is warm and dry.  Psychiatric: She has a normal mood and affect.    Wt Readings from Last 3 Encounters:   02/20/16 212 lb 12.8 oz (96.525 kg)  01/15/16 212 lb 6.4 oz (96.344 kg)  01/07/16 211 lb 12.8 oz (96.072 kg)      Studies/Labs Reviewed:     EKG:  EKG is  ordered today.  The ekg ordered today demonstrates NSR, HR 60, normal axis, early repolarization, QTc 448 ms  Recent Labs: 01/01/2016: TSH 2.008 01/05/2016: Magnesium 2.2 02/17/2016: ALT 16; B  Natriuretic Peptide 293.3*; BUN 55*; Creatinine, Ser 2.75*; Hemoglobin 11.5*; Platelets 182; Potassium 4.0; Sodium 138   Recent Lipid Panel No results found for: CHOL, TRIG, HDL, CHOLHDL, VLDL, LDLCALC, LDLDIRECT  Additional studies/ records that were reviewed today include:   Echo 01/02/16 Moderate concentric LVH, vigorous LV function, EF 65-70%, normal wall motion, grade 3 diastolic dysfunction, calcified aortic valve, MAC, mild MR, mild LAE  Myoview 2/02 IMPRESSION 1. NORMAL PERFUSION. NO ISCHEMIA. 2. EJECTION FRACTION IS 71%.  ASSESSMENT:     1. Chronic diastolic heart failure ([place])   2. Hypertensive heart and kidney disease with heart failure and with chronic kidney disease stage IV ([place])   3. CKD (chronic kidney disease), stage IV ([facility])     PLAN:     In order of problems listed above:  1. Chronic diastolic CHF - NYHA 2b.  Her volume has improved since adjusting her torsemide. Her diastolic reading is somewhat low today. I have asked her to go ahead and resume her usual dose of torsemide. We discussed the importance of daily weights and when to take extra torsemide. She limits her salt. She also limits her fluid intake. If she continues to have more and more issues with volume management, consider referral to the CHF clinic.  2. HTN Heart Disease - Mod LVH on echo with grade 3 diastolic dysfunction. Blood pressure is controlled.  3. CKD - FU with Dr. Louie Boston as planned. SCr 02/17/16 was 2.75.  She has follow-up lab work July 3.   Medication Adjustments/Labs and Tests Ordered: Current medicines are reviewed at length with  the patient today.  Concerns regarding medicines are outlined above.  Medication changes, Labs and Tests ordered today are outlined in the Patient Instructions noted below. Patient Instructions  Medication Instructions:  Your physician recommends that you continue on your current medications as directed. Please refer to the Current Medication list given to you today.  Labwork: No new orders.   Testing/Procedures: No new orders.   Follow-Up: Your physician recommends that you schedule a follow-up appointment in: 3 MONTHS with Richardson Dopp PA-C  Any Other Special Instructions Will Be Listed Below (If Applicable).  If you need a refill on your cardiac medications before your next appointment, please call your pharmacy.   Signed, Richardson Dopp, PA-C  02/20/2016 11:46 AM    [facility] [address], [place], [place]  [address] Phone: [phone]; Fax: [phone]

## 2016-02-20 ENCOUNTER — Encounter: Payer: Self-pay | Admitting: Physician Assistant

## 2016-02-20 ENCOUNTER — Ambulatory Visit (INDEPENDENT_AMBULATORY_CARE_PROVIDER_SITE_OTHER): Payer: Medicare HMO | Admitting: Physician Assistant

## 2016-02-20 VITALS — BP 118/30 | HR 60 | Ht 65.0 in | Wt 212.8 lb

## 2016-02-20 DIAGNOSIS — N184 Chronic kidney disease, stage 4 (severe): Secondary | ICD-10-CM

## 2016-02-20 DIAGNOSIS — I130 Hypertensive heart and chronic kidney disease with heart failure and stage 1 through stage 4 chronic kidney disease, or unspecified chronic kidney disease: Secondary | ICD-10-CM | POA: Diagnosis not present

## 2016-02-20 DIAGNOSIS — I5032 Chronic diastolic (congestive) heart failure: Secondary | ICD-10-CM

## 2016-02-20 NOTE — Patient Instructions (Addendum)
Medication Instructions:  Your physician recommends that you continue on your current medications as directed. Please refer to the Current Medication list given to you today.  Labwork: No new orders.   Testing/Procedures: No new orders.   Follow-Up: Your physician recommends that you schedule a follow-up appointment in: 3 MONTHS with Richardson Dopp PA-C  Any Other Special Instructions Will Be Listed Below (If Applicable).  If you need a refill on your cardiac medications before your next appointment, please call your pharmacy.

## 2016-02-26 ENCOUNTER — Ambulatory Visit: Payer: Medicare HMO | Admitting: Gastroenterology

## 2016-05-14 ENCOUNTER — Encounter: Payer: Self-pay | Admitting: Physician Assistant

## 2016-05-27 NOTE — Progress Notes (Signed)
Cardiology Office Note:    Date:  05/28/2016   ID:  [name], DOB 12/07/1928, MRN [id number]  PCP:  Antonietta Jewel, MD  Cardiologist:  Dr. Allegra Lai //  Richardson Dopp, PA-C  Electrophysiologist: N/a Nephrologist: Dr. Moshe Cipro  Referring MD: Antonietta Jewel, MD   Chief Complaint  Patient presents with  . Follow-up    CHF    History of Present Illness:    [name] is a 82 y.o. female with a hx of chronic diastolic CHF, HTN, HL, CKD, prior DVT.   Admitted in 5/17 with acute diastolic CHF.  Echocardiogram demonstrated moderate LVH, normal LV function and moderate to severe diastolic dysfunction.   Last seen 02/20/16.  She is here with her daughters for FU. She is doing well. She is going to the [facility] and doing water aerobics. She denies significant dyspnea.  She denies chest pain, orthopnea, PND, edema.  She denies syncope.  She has a lot of issues with abdominal pain, especially in the AM.  Prior CV studies that were reviewed today include:    Echo 01/02/16 Moderate concentric LVH, vigorous LV function, EF 65-70%, normal wall motion, grade 3 diastolic dysfunction, calcified aortic valve, MAC, mild MR, mild LAE  Myoview 2/02 IMPRESSION 1. NORMAL PERFUSION. NO ISCHEMIA. 2. EJECTION FRACTION IS 71%.  Past Medical History:  Diagnosis Date  . Arthritis   . Chronic diastolic CHF (congestive heart failure) ([place])    a. Echo 01/02/16: Moderate concentric LVH, vigorous LV function, EF 65-70%, normal wall motion, grade 3 diastolic dysfunction, calcified aortic valve, MAC, mild MR, mild LAE  . CKD (chronic kidney disease)    Sees Dr. Moshe Cipro who took her off benicar in 2013.             Marland Kitchen Heart murmur    long time ago--no problems  . Hypercholesteremia   . Hypertensive heart and kidney disease with heart failure and with chronic kidney disease stage IV [facility])     Past Surgical History:  Procedure Laterality Date  . CATARACT EXTRACTION W/PHACO  Right 03/20/2014   Procedure: CATARACT EXTRACTION PHACO AND INTRAOCULAR LENS PLACEMENT (IOC);  Surgeon: [name], MD;  Location: [facility];  Service: Ophthalmology;  Laterality: Right;  . failed knee surgery     on the right x 2  . JOINT REPLACEMENT     both knees  . left ankle orif      Current Medications: Current Meds  Medication Sig  . allopurinol (ZYLOPRIM) 100 MG tablet Take 100 mg by mouth 2 (two) times daily.  Marland Kitchen ALPRAZolam (XANAX) 0.5 MG tablet Take 0.5 mg by mouth 3 (three) times daily as needed. For anxiety  . ferrous sulfate 325 (65 FE) MG tablet Take 325 mg by mouth daily with breakfast.  . hydrALAZINE (APRESOLINE) 50 MG tablet Take 50 mg by mouth 3 (three) times daily.  Marland Kitchen HYDROcodone-acetaminophen (NORCO) 10-325 MG per tablet Take 1 tablet by mouth every 6 (six) hours as needed for severe pain.   . metoprolol (LOPRESSOR) 50 MG tablet Take 50 mg by mouth 2 (two) times daily.  Marland Kitchen omeprazole (PRILOSEC) 20 MG capsule Take 20 mg by mouth daily.   . promethazine (PHENERGAN) 12.5 MG tablet Take 12.5 mg by mouth every 6 (six) hours as needed for nausea or vomiting.  . torsemide (DEMADEX) 20 MG tablet Take 20 mg by mouth 2 (two) times daily.     Allergies:   Naprosyn [naproxen]   Social History  Social History  . Marital status: Single    Spouse name: N/A  . Number of children: N/A  . Years of education: N/A   Social History Main Topics  . Smoking status: Never Smoker  . Smokeless tobacco: Never Used  . Alcohol use 0.6 oz/week    1 Standard drinks or equivalent per week     Comment: occasional  . Drug use: No  . Sexual activity: Not on file   Other Topics Concern  . 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   ROS:   Please see the history of present illness.    Review of Systems  Musculoskeletal: Positive for back pain and joint pain.  Gastrointestinal: Positive for abdominal pain.  Psychiatric/Behavioral: The patient is nervous/anxious.    All other systems reviewed and are negative.   EKGs/Labs/Other Test Reviewed:    EKG:  EKG is not ordered today.  The ekg ordered today demonstrates n/a  Recent Labs: 01/01/2016: TSH 2.008 01/05/2016: Magnesium 2.2 02/17/2016: ALT 16; B Natriuretic Peptide 293.3; BUN 55; Creatinine, Ser 2.75; Hemoglobin 11.5; Platelets 182; Potassium 4.0; Sodium 138   Recent Lipid Panel No results found for: CHOL, TRIG, HDL, CHOLHDL, VLDL, LDLCALC, LDLDIRECT   Physical Exam:    VS:  BP (!) 142/40   Pulse (!) 50   Ht 5\' 5"  (1.651 m)   Wt 210 lb 12.8 oz (95.6 kg)   SpO2 98%   BMI 35.08 kg/m     Wt Readings from Last 3 Encounters:  05/28/16 210 lb 12.8 oz (95.6 kg)  02/20/16 212 lb 12.8 oz (96.5 kg)  01/15/16 212 lb 6.4 oz (96.3 kg)     Physical Exam  Constitutional: She is oriented to person, place, and time. She appears well-developed and well-nourished. No distress.  HENT:  Head: Normocephalic and atraumatic.  Eyes: No scleral icterus.  Neck: No JVD present.  Cardiovascular: Normal rate, regular rhythm and normal heart sounds.   No murmur heard. Pulmonary/Chest: Effort normal. She has no wheezes. She has no rales.  Abdominal: Soft. There is no tenderness.  Musculoskeletal: She exhibits no edema.  Neurological: She is alert and oriented to person, place, and time.  Skin: Skin is warm and dry.  Psychiatric: She has a normal mood and affect.    ASSESSMENT:    1. Chronic diastolic heart failure ([place])   2. Hypertensive heart and kidney disease with heart failure and with chronic kidney disease stage IV ([place])   3. CKD (chronic kidney disease), stage IV ([facility])    PLAN:    In order of problems listed  above:  1. Chronic diastolic CHF - NYHA 2b.  Volume is stable on current dose of Torsemide.  Continue current Rx.   2. HTN Heart Disease - Mod LVH on echo with grade 3 diastolic dysfunction. Blood pressure is controlled.  Continue current regimen.   3. CKD - FU with Dr. Louie Boston as planned.     Medication Adjustments/Labs and Tests Ordered: Current medicines are reviewed at length with the patient today.  Concerns regarding medicines are outlined above.  Medication changes, Labs and Tests ordered  today are outlined in the Patient Instructions noted below. Patient Instructions  Medication Instructions:  Your physician recommends that you continue on your current medications as directed. Please refer to the Current Medication list given to you today.  Labwork: NONE  Testing/Procedures: NONE  Follow-Up: Your physician wants you to follow-up in: [place], [facility]  You will receive a reminder letter in the mail two months in advance. If you don't receive a letter, please call our office to schedule the follow-up appointment.  Any Other Special Instructions Will Be Listed Below (If Applicable). If you need a refill on your cardiac medications before your next appointment, please call your pharmacy.  Signed, Richardson Dopp, PA-C  05/28/2016 12:48 PM    [facility] [place], [place], [place]  [address] Phone: [phone]; Fax: [phone]

## 2016-05-28 ENCOUNTER — Ambulatory Visit (INDEPENDENT_AMBULATORY_CARE_PROVIDER_SITE_OTHER): Payer: Medicare HMO | Admitting: Physician Assistant

## 2016-05-28 ENCOUNTER — Encounter: Payer: Self-pay | Admitting: Physician Assistant

## 2016-05-28 ENCOUNTER — Ambulatory Visit: Payer: Medicare HMO | Admitting: Physician Assistant

## 2016-05-28 VITALS — BP 142/40 | HR 50 | Ht 65.0 in | Wt 210.8 lb

## 2016-05-28 DIAGNOSIS — N184 Chronic kidney disease, stage 4 (severe): Secondary | ICD-10-CM | POA: Diagnosis not present

## 2016-05-28 DIAGNOSIS — I130 Hypertensive heart and chronic kidney disease with heart failure and stage 1 through stage 4 chronic kidney disease, or unspecified chronic kidney disease: Secondary | ICD-10-CM | POA: Diagnosis not present

## 2016-05-28 DIAGNOSIS — I5032 Chronic diastolic (congestive) heart failure: Secondary | ICD-10-CM

## 2016-05-28 NOTE — Patient Instructions (Addendum)
Medication Instructions:  Your physician recommends that you continue on your current medications as directed. Please refer to the Current Medication list given to you today.  Labwork: NONE  Testing/Procedures: NONE  Follow-Up: Your physician wants you to follow-up in: 1 YEAR WITH [facility]ER,  [facility]  You will receive a reminder letter in the mail two months in advance. If you don't receive a letter, please call our office to schedule the follow-up appointment.  Any Other Special Instructions Will Be Listed Below (If Applicable).  If you need a refill on your cardiac medications before your next appointment, please call your pharmacy. 

## 2017-01-17 ENCOUNTER — Encounter (HOSPITAL_COMMUNITY): Payer: Self-pay | Admitting: Emergency Medicine

## 2017-01-17 ENCOUNTER — Emergency Department (HOSPITAL_COMMUNITY): Payer: Medicare HMO

## 2017-01-17 ENCOUNTER — Ambulatory Visit (HOSPITAL_BASED_OUTPATIENT_CLINIC_OR_DEPARTMENT_OTHER)
Admission: RE | Admit: 2017-01-17 | Discharge: 2017-01-17 | Disposition: A | Discharge status: Home / Self Care | Payer: Medicare HMO | Source: Ambulatory Visit | Attending: Emergency Medicine | Admitting: Emergency Medicine

## 2017-01-17 ENCOUNTER — Emergency Department (HOSPITAL_COMMUNITY)
Admission: EM | Admit: 2017-01-17 | Discharge: 2017-01-17 | Disposition: A | Discharge status: Home / Self Care | Payer: Medicare HMO | Attending: Emergency Medicine | Admitting: Emergency Medicine

## 2017-01-17 DIAGNOSIS — M79672 Pain in left foot: Secondary | ICD-10-CM | POA: Diagnosis not present

## 2017-01-17 DIAGNOSIS — Z96653 Presence of artificial knee joint, bilateral: Secondary | ICD-10-CM | POA: Insufficient documentation

## 2017-01-17 DIAGNOSIS — N184 Chronic kidney disease, stage 4 (severe): Secondary | ICD-10-CM | POA: Diagnosis not present

## 2017-01-17 DIAGNOSIS — Z79899 Other long term (current) drug therapy: Secondary | ICD-10-CM | POA: Insufficient documentation

## 2017-01-17 DIAGNOSIS — I5032 Chronic diastolic (congestive) heart failure: Secondary | ICD-10-CM | POA: Diagnosis not present

## 2017-01-17 DIAGNOSIS — I130 Hypertensive heart and chronic kidney disease with heart failure and stage 1 through stage 4 chronic kidney disease, or unspecified chronic kidney disease: Secondary | ICD-10-CM | POA: Insufficient documentation

## 2017-01-17 DIAGNOSIS — M79671 Pain in right foot: Secondary | ICD-10-CM | POA: Diagnosis not present

## 2017-01-17 DIAGNOSIS — M79609 Pain in unspecified limb: Secondary | ICD-10-CM

## 2017-01-17 LAB — I-STAT TROPONIN, ED: Troponin i, poc: 0.01 ng/mL (ref 0.00–0.08)

## 2017-01-17 LAB — CBC WITH DIFFERENTIAL/PLATELET
BASOS PCT: 0 %
Basophils Absolute: 0 10*3/uL (ref 0.0–0.1)
Eosinophils Absolute: 0.1 10*3/uL (ref 0.0–0.7)
Eosinophils Relative: 1 %
HEMATOCRIT: 35.7 % — AB (ref 36.0–46.0)
HEMOGLOBIN: 10.8 g/dL — AB (ref 12.0–15.0)
LYMPHS ABS: 3.1 10*3/uL (ref 0.7–4.0)
LYMPHS PCT: 46 %
MCH: 25.3 pg — ABNORMAL LOW (ref 26.0–34.0)
MCHC: 30.3 g/dL (ref 30.0–36.0)
MCV: 83.6 fL (ref 78.0–100.0)
MONOS PCT: 6 %
Monocytes Absolute: 0.4 10*3/uL (ref 0.1–1.0)
NEUTROS ABS: 3.3 10*3/uL (ref 1.7–7.7)
NEUTROS PCT: 48 %
Platelets: 138 10*3/uL — ABNORMAL LOW (ref 150–400)
RBC: 4.27 MIL/uL (ref 3.87–5.11)
RDW: 15 % (ref 11.5–15.5)
WBC: 6.9 10*3/uL (ref 4.0–10.5)

## 2017-01-17 LAB — I-STAT CHEM 8, ED
BUN: 59 mg/dL — AB (ref 6–20)
CHLORIDE: 105 mmol/L (ref 101–111)
CREATININE: 3.2 mg/dL — AB (ref 0.44–1.00)
Calcium, Ion: 1.13 mmol/L — ABNORMAL LOW (ref 1.15–1.40)
Glucose, Bld: 113 mg/dL — ABNORMAL HIGH (ref 65–99)
HEMATOCRIT: 35 % — AB (ref 36.0–46.0)
Hemoglobin: 11.9 g/dL — ABNORMAL LOW (ref 12.0–15.0)
Potassium: 4.3 mmol/L (ref 3.5–5.1)
SODIUM: 141 mmol/L (ref 135–145)
TCO2: 28 mmol/L (ref 0–100)

## 2017-01-17 MED ORDER — FENTANYL CITRATE (PF) 100 MCG/2ML IJ SOLN
50.0000 ug | Freq: Once | INTRAMUSCULAR | Status: AC
  Administered 2017-01-17: 50 ug via INTRAVENOUS
  Filled 2017-01-17: qty 2

## 2017-01-17 MED ORDER — AMLODIPINE BESYLATE 5 MG PO TABS
2.5000 mg | ORAL_TABLET | Freq: Every day | ORAL | Status: DC
Start: 2017-01-17 — End: 2017-01-17
  Administered 2017-01-17: 2.5 mg via ORAL
  Filled 2017-01-17: qty 1

## 2017-01-17 MED ORDER — METHOCARBAMOL 1000 MG/10ML IJ SOLN
1000.0000 mg | Freq: Once | INTRAVENOUS | Status: AC
  Administered 2017-01-17: 1000 mg via INTRAVENOUS
  Filled 2017-01-17: qty 10

## 2017-01-17 MED ORDER — HYDRALAZINE HCL 20 MG/ML IJ SOLN
5.0000 mg | Freq: Once | INTRAMUSCULAR | Status: AC
  Administered 2017-01-17: 5 mg via INTRAVENOUS
  Filled 2017-01-17: qty 1

## 2017-01-17 MED ORDER — METHOCARBAMOL 1000 MG/10ML IJ SOLN: 1000.0000 mg | Freq: Once | INTRAMUSCULAR | Status: DC

## 2017-01-17 MED ORDER — SODIUM CHLORIDE 0.9 % IV BOLUS (SEPSIS): 250.0000 mL | Freq: Once | INTRAVENOUS | Status: DC

## 2017-01-17 NOTE — Progress Notes (Signed)
VASCULAR LAB PRELIMINARY  PRELIMINARY  PRELIMINARY  PRELIMINARY  Bilateral lower extremity venous duplex completed.    Preliminary report:  There is no DVT or SVT noted in the bilateral lower extremities.  There is evidence of chronic arterial disease and would suggest a lower arterial workup.    Called Dr. Ralene Cork office at [phone], and let the secretary know the patient has chronic arterial disease and would benefit from an arterial workup.  Patient states she has appointment there 01/19/17   [name], RVT 01/17/2017, 11:25 AM

## 2017-01-17 NOTE — ED Notes (Signed)
Pt complains of HTN and numbness to the left foot for a couple of days now.

## 2017-01-17 NOTE — ED Triage Notes (Signed)
Per [facility]  Coming from home. Pt aroused 30 min   Pain across toes and felt as if her topes were drawing up. Past hx of "DVT". Having some trouble with ambulating. Hx of CHF. Exertional SOB.

## 2017-01-17 NOTE — ED Provider Notes (Addendum)
[facility] DEPT Provider Note   CSN: [id number] Arrival date & time: 01/17/17  8811  By signing my name below, I, Hansel Feinstein, attest that this documentation has been prepared under the direction and in the presence of [place], Valentin Benney, MD. Electronically Signed: Hansel Feinstein, ED Scribe. 01/17/17. 12:43 AM.     History   Chief Complaint Chief Complaint  Patient presents with  . Hypertension  . Numbness    foot numbness    HPI [name] is a 83 y.o. female who presents to the Emergency Department complaining of bilateral foot pain that began a few days ago and worsened tonight. She reports associated aching left leg pain that began tonight. She denies recent injury, falls or trauma. Pt has been taking Hydrocodone with no relief, which she normally takes for chronic knee/back pain, with no relief. She states her chronic back pain has not worsened from baseline. She denies rash, wounds, additional complaints.   The history is provided by the patient. No language interpreter was used.  Foot Pain  This is a chronic problem. The current episode started more than 2 days ago. The problem occurs constantly. The problem has been gradually worsening. Pertinent negatives include no chest pain, no abdominal pain, no headaches and no shortness of breath. Nothing aggravates the symptoms. Nothing relieves the symptoms. Treatments tried: vicodin. The treatment provided no relief.    Past Medical History:  Diagnosis Date  . Arthritis   . Chronic diastolic CHF (congestive heart failure) ([place])    a. Echo 01/02/16: Moderate concentric LVH, vigorous LV function, EF 65-70%, normal wall motion, grade 3 diastolic dysfunction, calcified aortic valve, MAC, mild MR, mild LAE  . CKD (chronic kidney disease)    Sees Dr. Moshe Cipro who took her off benicar in 2013.             Marland Kitchen Heart murmur    long time ago--no problems  . Hypercholesteremia   . Hypertensive heart and kidney disease with heart failure  and with chronic kidney disease stage IV [facility])     Patient Active Problem List   Diagnosis Date Noted  . Hypertensive heart and kidney disease with heart failure and with chronic kidney disease stage IV ([place])   . Fever, unspecified 01/06/2016  . Acute respiratory failure with hypoxia ([place]) 01/01/2016  . Chronic diastolic heart failure ([place]) 01/01/2016  . History of DVT (deep vein thrombosis) 01/01/2016  . CKD (chronic kidney disease), stage IV ([place]) 01/01/2016  . Anemia of renal disease 01/01/2016  . Iron deficiency anemia 01/01/2016  . Hyperkalemia 11/21/2014  . Diarrhea 11/21/2014  . Metabolic acidosis 11/11/9456  . Renal failure (ARF), acute on chronic ([place]) 11/21/2014    Past Surgical History:  Procedure Laterality Date  . CATARACT EXTRACTION W/PHACO Right 03/20/2014   Procedure: CATARACT EXTRACTION PHACO AND INTRAOCULAR LENS PLACEMENT (IOC);  Surgeon: [name], MD;  Location: [facility];  Service: Ophthalmology;  Laterality: Right;  . failed knee surgery     on the right x 2  . JOINT REPLACEMENT     both knees  . left ankle orif      OB History    No data available       Home Medications    Prior to Admission medications   Medication Sig Start Date End Date Taking? Authorizing Provider  allopurinol (ZYLOPRIM) 100 MG tablet Take 100 mg by mouth 2 (two) times daily. 12/24/15   [provider]  ALPRAZolam Duanne Moron) 0.5 MG tablet Take 0.5  mg by mouth 3 (three) times daily as needed. For anxiety    [provider]  ferrous sulfate 325 (65 FE) MG tablet Take 325 mg by mouth daily with breakfast.    [provider]  hydrALAZINE (APRESOLINE) 50 MG tablet Take 50 mg by mouth 3 (three) times daily.    [provider]  HYDROcodone-acetaminophen (NORCO) 10-325 MG per tablet Take 1 tablet by mouth every 6 (six) hours as needed for severe pain.  10/02/14   [provider]  metoprolol (LOPRESSOR) 50 MG tablet Take 50 mg by mouth 2 (two) times  daily.    [provider]  omeprazole (PRILOSEC) 20 MG capsule Take 20 mg by mouth daily.     [provider]  promethazine (PHENERGAN) 12.5 MG tablet Take 12.5 mg by mouth every 6 (six) hours as needed for nausea or vomiting.    [provider]  torsemide (DEMADEX) 20 MG tablet Take 20 mg by mouth 2 (two) times daily. 05/11/16   [provider]    Family History Family History  Problem Relation Age of Onset  . Hypertension Mother   . Stroke Father   . Diabetes Mellitus II Daughter   . Hypertension Daughter   . Heart attack Neg Hx     Social History Social History  Substance Use Topics  . Smoking status: Never Smoker  . Smokeless tobacco: Never Used  . Alcohol use 0.6 oz/week    1 Standard drinks or equivalent per week     Comment: occasional     Allergies   Naprosyn [naproxen]   Review of Systems Review of Systems  Respiratory: Negative for shortness of breath.   Cardiovascular: Negative for chest pain and leg swelling.  Gastrointestinal: Negative for abdominal pain.  Musculoskeletal: Positive for myalgias. Negative for neck pain and neck stiffness.  Skin: Negative for rash and wound.  Neurological: Negative for tremors, seizures, syncope, speech difficulty, weakness, light-headedness, numbness and headaches.  All other systems reviewed and are negative.    Physical Exam Updated Vital Signs SpO2 99%   Physical Exam  Constitutional: She is oriented to person, place, and time. She appears well-developed and well-nourished.  HENT:  Head: Normocephalic and atraumatic.  Mouth/Throat: Oropharynx is clear and moist. No oropharyngeal exudate.  Moist mucous membranes   Eyes: Conjunctivae and EOM are normal. Pupils are equal, round, and reactive to light.  Neck: Normal range of motion. Neck supple. No JVD present. No tracheal deviation present.  No carotid bruits. Trachea midline.   Cardiovascular: Normal rate, regular rhythm, normal  heart sounds and intact distal pulses.  Exam reveals no gallop and no friction rub.   No murmur heard. RRR.   Pulmonary/Chest: Effort normal and breath sounds normal. No stridor. No respiratory distress. She has no wheezes. She has no rales.  Lungs CTA bilaterally.   Abdominal: Soft. Bowel sounds are normal. She exhibits no distension. There is no rebound and no guarding.  Musculoskeletal: Normal range of motion. She exhibits no edema or tenderness.  No peripheral edema. All compartments are soft. Normal cords. Intact achilles bilaterally.   Lymphadenopathy:    She has no cervical adenopathy.  Neurological: She is alert and oriented to person, place, and time. She has normal reflexes. She displays normal reflexes. No cranial nerve deficit or sensory deficit. She exhibits normal muscle tone. Coordination normal.  Sensation intact to bilateral feet   Skin: Skin is warm and dry. Capillary refill takes less than 2 seconds.  Psychiatric:  She has a normal mood and affect. Her behavior is normal.  Nursing note and vitals reviewed.    ED Treatments / Results    Vitals:   01/17/17 0300 01/17/17 0315  BP: (!) 192/41 (!) 195/39  Pulse: (!) 51 (!) 55  Resp: 12 19  Temp:      DIAGNOSTIC STUDIES: Oxygen Saturation is 99% on RA, normal by my interpretation.    COORDINATION OF CARE: 12:40 AM Discussed treatment plan with pt at bedside which includes labs and pt agreed to plan.    Labs (all labs ordered are listed, but only abnormal results are displayed) Results for orders placed or performed during the hospital encounter of 01/17/17  CBC with Differential/Platelet  Result Value Ref Range   WBC 6.9 4.0 - 10.5 K/uL   RBC 4.27 3.87 - 5.11 MIL/uL   Hemoglobin 10.8 (L) 12.0 - 15.0 g/dL   HCT 35.7 (L) 36.0 - 46.0 %   MCV 83.6 78.0 - 100.0 fL   MCH 25.3 (L) 26.0 - 34.0 pg   MCHC 30.3 30.0 - 36.0 g/dL   RDW 15.0 11.5 - 15.5 %   Platelets 138 (L) 150 - 400 K/uL   Neutrophils Relative % 48 %     Neutro Abs 3.3 1.7 - 7.7 K/uL   Lymphocytes Relative 46 %   Lymphs Abs 3.1 0.7 - 4.0 K/uL   Monocytes Relative 6 %   Monocytes Absolute 0.4 0.1 - 1.0 K/uL   Eosinophils Relative 1 %   Eosinophils Absolute 0.1 0.0 - 0.7 K/uL   Basophils Relative 0 %   Basophils Absolute 0.0 0.0 - 0.1 K/uL  I-Stat Chem 8, ED  Result Value Ref Range   Sodium 141 135 - 145 mmol/L   Potassium 4.3 3.5 - 5.1 mmol/L   Chloride 105 101 - 111 mmol/L   BUN 59 (H) 6 - 20 mg/dL   Creatinine, Ser 3.20 (H) 0.44 - 1.00 mg/dL   Glucose, Bld 113 (H) 65 - 99 mg/dL   Calcium, Ion 1.13 (L) 1.15 - 1.40 mmol/L   TCO2 28 0 - 100 mmol/L   Hemoglobin 11.9 (L) 12.0 - 15.0 g/dL   HCT 35.0 (L) 36.0 - 46.0 %  I-stat troponin, ED  Result Value Ref Range   Troponin i, poc 0.01 0.00 - 0.08 ng/mL   Comment 3           Dg Chest 2 View  Result Date: 01/17/2017 CLINICAL DATA:  Hypertension tonight. EXAM: CHEST  2 VIEW COMPARISON:  Frontal and lateral views 02/17/2016 FINDINGS: The cardiomediastinal contours are unchanged with stable cardiomegaly and atherosclerosis of the thoracic aorta. Pulmonary vasculature is normal. No consolidation, pleural effusion, or pneumothorax. No acute osseous abnormalities are seen. Degenerative changes of both shoulders and throughout spine. IMPRESSION: Stable cardiomegaly and thoracic aortic atherosclerosis. No congestive failure or acute abnormality. Electronically Signed   By: Jeb Levering M.D.   On: 01/17/2017 01:21     EKG Interpretation  Date/Time:  Monday Jan 17 2017 01:33:15 EDT Ventricular Rate:  56 PR Interval:    QRS Duration: 92 QT Interval:  492 QTC Calculation: 475 R Axis:   73 Text Interpretation:  Sinus rhythm Consider left ventricular hypertrophy Confirmed by Randal Buba, Kore Madlock ([id number]) on 01/17/2017 1:57:07 AM       Procedures Procedures (including critical care time)  Medications Ordered in ED  Medications  hydrALAZINE (APRESOLINE) injection 5 mg (5 mg Intravenous  Given 01/17/17 0141)  fentaNYL (SUBLIMAZE) injection 50 mcg (  50 mcg Intravenous Given 01/17/17 0142)  methocarbamol (ROBAXIN) 1,000 mg in dextrose 5 % 50 mL IVPB (0 mg Intravenous Stopped 01/17/17 0221)      Final Clinical Impressions(s) / ED Diagnoses  Foot pain: sensation was intact to confrontation in all nerve distributions of the foot. I suspect this is neuropathic pain or paresthesias.  Patient cannot be on NSAIDs secondary to stage 3-4 kidney disease and is already on vicodin.  Will rule out for dvt but this is highly unlikely as the cause will refer to specialists for ongoing testing and care.  I have set up for outpatient doppler and refer to podiatry and nephrology for renal insufficiency which is worsening.   Return for exterional chest pain shortness of breath, leg pain or swelling or any concerns.  Follow up with your family doctor for ongoing care  The patient is nontoxic-appearing on exam and vital signs are within normal limits.   I have reviewed the triage vital signs and the nursing notes. Pertinent labs &imaging results that were available during my care of the patient were reviewed by me and considered in my medical decision making (see chart for details). The patient is nontoxic-appearing on exam and vital signs are within normal limits. Return for fevers, chest pain with exertion, weakness, numbness, neck pain or stiffness, inability to make or understand speech or any concerns.   After history, exam, and medical workup I feel the patient has been appropriately medically screened and is safe for discharge home. Pertinent diagnoses were discussed with the patient. Patient was given return precautions.   I personally performed the services described in this documentation, which was scribed in my presence. The recorded information has been reviewed and is accurate.         Anara Cowman, MD 01/17/17 [place], Maanvi Lecompte, MD 01/17/17 [id number]

## 2017-01-17 NOTE — ED Notes (Signed)
Patient transported to X-ray 

## 2017-06-20 ENCOUNTER — Other Ambulatory Visit (HOSPITAL_COMMUNITY): Payer: Self-pay

## 2017-06-21 ENCOUNTER — Ambulatory Visit (HOSPITAL_COMMUNITY)
Admission: RE | Admit: 2017-06-21 | Discharge: 2017-06-21 | Disposition: A | Discharge status: Home / Self Care | Payer: Medicare HMO | Source: Ambulatory Visit | Attending: Nephrology | Admitting: Nephrology

## 2017-06-21 DIAGNOSIS — D631 Anemia in chronic kidney disease: Secondary | ICD-10-CM | POA: Diagnosis present

## 2017-06-21 DIAGNOSIS — N189 Chronic kidney disease, unspecified: Secondary | ICD-10-CM | POA: Insufficient documentation

## 2017-06-21 MED ORDER — SODIUM CHLORIDE 0.9 % IV SOLN
510.0000 mg | INTRAVENOUS | Status: DC
  Administered 2017-06-21: 510 mg via INTRAVENOUS
  Filled 2017-06-21: qty 17

## 2017-06-21 NOTE — Discharge Instructions (Signed)

## 2017-06-27 ENCOUNTER — Ambulatory Visit (HOSPITAL_COMMUNITY)
Admission: RE | Admit: 2017-06-27 | Discharge: 2017-06-27 | Disposition: A | Discharge status: Home / Self Care | Payer: Medicare HMO | Source: Ambulatory Visit | Attending: Nephrology | Admitting: Nephrology

## 2017-06-27 DIAGNOSIS — D631 Anemia in chronic kidney disease: Secondary | ICD-10-CM | POA: Insufficient documentation

## 2017-06-27 DIAGNOSIS — N189 Chronic kidney disease, unspecified: Secondary | ICD-10-CM | POA: Insufficient documentation

## 2017-06-27 MED ORDER — SODIUM CHLORIDE 0.9 % IV SOLN
510.0000 mg | INTRAVENOUS | Status: DC
  Administered 2017-06-27: 510 mg via INTRAVENOUS
  Filled 2017-06-27: qty 17

## 2017-06-27 MED FILL — Sodium Chloride IV Soln 0.9%: INTRAVENOUS | Qty: 100 | Status: AC

## 2017-06-27 MED FILL — Ferumoxytol Inj 510 MG/17ML (30 MG/ML) (Elemental Fe): INTRAVENOUS | Qty: 17 | Status: AC

## 2017-09-12 ENCOUNTER — Telehealth: Payer: Self-pay | Admitting: Cardiology

## 2017-09-12 NOTE — Telephone Encounter (Signed)
New Message     Daughter called regarding patient having sob and weakness   Pt c/o Shortness Of Breath: STAT if SOB developed within the last 24 hours or pt is noticeably SOB on the phone  1. Are you currently SOB (can you hear that pt is SOB on the phone)?  no  2. How long have you been experiencing SOB?  A couple of weeks   3. Are you SOB when sitting or when up moving around?  Both   4. Are you currently experiencing any other symptoms? No

## 2017-09-12 NOTE — Telephone Encounter (Signed)
Spoke with patient's daughter and set up an appointment for Thursday @ 1:30pm with Ellen Henri.Marland KitchenMarland Kitchen

## 2017-09-15 ENCOUNTER — Ambulatory Visit: Payer: Medicare HMO | Admitting: Cardiology

## 2017-09-15 ENCOUNTER — Encounter: Payer: Self-pay | Admitting: Cardiology

## 2017-09-15 VITALS — BP 190/100 | HR 47 | Ht 65.0 in | Wt 202.8 lb

## 2017-09-15 DIAGNOSIS — R001 Bradycardia, unspecified: Secondary | ICD-10-CM

## 2017-09-15 DIAGNOSIS — I10 Essential (primary) hypertension: Secondary | ICD-10-CM

## 2017-09-15 DIAGNOSIS — R0600 Dyspnea, unspecified: Secondary | ICD-10-CM

## 2017-09-15 MED ORDER — METOPROLOL TARTRATE 25 MG PO TABS: 25.0000 mg | ORAL_TABLET | Freq: Two times a day (BID) | ORAL | 3 refills | Status: DC

## 2017-09-15 MED ORDER — AMLODIPINE BESYLATE 5 MG PO TABS: 5.0000 mg | ORAL_TABLET | Freq: Every day | ORAL | 3 refills | Status: DC

## 2017-09-15 NOTE — Patient Instructions (Signed)
Medication Instructions:  Your physician has recommended you make the following change in your medication:  1.  DECREASE the Metoprolol to 25 mg daily 2.  START Amlodipine 5 mg daily  Labwork: TODAY:  BMET, PRO BNP, & CBC  Testing/Procedures: None ordered  Follow-Up: Your physician recommends that you schedule a follow-up appointment in: 09/29/17 ARRIVE AT 1:15 TO SEE BRITTANY SIMMONS, PA    Any Other Special Instructions Will Be Listed Below (If Applicable).     If you need a refill on your cardiac medications before your next appointment, please call your pharmacy.

## 2017-09-15 NOTE — Progress Notes (Signed)
09/15/2017 [name]   Jul 13, 1929  [id number]  Primary Physician Antonietta Jewel, MD Primary Cardiologist: Dr. Romilda Garret  Reason for Visit/CC: Dyspnea  HPI:  84 y.o. female with a hx of chronic diastolic CHF, HTN, HL, CKD, prior DVT.  She was admitted in 5/17 with acute diastolic CHF. Echocardiogram demonstrated moderate LVH, normal LV function and moderate to severe diastolic dysfunction. She had a NST in 2002 that was normal. She is on toresimide, 20 mg daily.   She presents to clinic today for evaluation given dypnea, both at rest and with exertion, that has been present fot the past 6 weeks. She denies CP. No wheezing. Also denies dizziness, fatigue, syncope/ near syncope. No weight gain but she notes orthopnea, and has had to increase from 1-2 pillows at night.   EKG today shows sinus bradycardia, 47 bpm. She is on metoprolol 50 mg BID and clonidine 0.1 mg. BP is elevated at 190/100. She has not yet taken her mid day dose of hydralazine and not yet taken her torsemide.    Current Meds  Medication Sig  . allopurinol (ZYLOPRIM) 100 MG tablet Take 100 mg by mouth 2 (two) times daily.  Marland Kitchen ALPRAZolam (XANAX) 0.5 MG tablet Take 0.5 mg by mouth 3 (three) times daily as needed. For anxiety  . aspirin EC 81 MG tablet Take 81 mg by mouth daily.  . cloNIDine (CATAPRES) 0.1 MG tablet Take 0.1 mg by mouth daily.   Marland Kitchen docusate sodium (COLACE) 100 MG capsule Take 100 mg by mouth 3 (three) times a week.  . ferrous sulfate 325 (65 FE) MG tablet Take 325 mg by mouth 2 (two) times daily with a meal.   . hydrALAZINE (APRESOLINE) 100 MG tablet Take 100 mg by mouth 3 (three) times daily.   Marland Kitchen HYDROcodone-acetaminophen (NORCO) 10-325 MG tablet Take 1 tablet by mouth every 6 (six) hours as needed.  . lovastatin (MEVACOR) 20 MG tablet Take 20 mg by mouth daily.  . Multiple Vitamin (MULTIVITAMIN WITH MINERALS) TABS tablet Take 1 tablet by mouth daily.  Marland Kitchen torsemide (DEMADEX) 20 MG tablet  Take 40 mg by mouth daily.   . [DISCONTINUED] metoprolol (LOPRESSOR) 50 MG tablet Take 50 mg by mouth 2 (two) times daily.   Allergies  Allergen Reactions  . Naprosyn [Naproxen] Itching    Hot burning feeling   Past Medical History:  Diagnosis Date  . Arthritis   . Chronic diastolic CHF (congestive heart failure) ([place])    a. Echo 01/02/16: Moderate concentric LVH, vigorous LV function, EF 65-70%, normal wall motion, grade 3 diastolic dysfunction, calcified aortic valve, MAC, mild MR, mild LAE  . CKD (chronic kidney disease)    Sees Dr. Moshe Cipro who took her off benicar in 2013.             Marland Kitchen Heart murmur    long time ago--no problems  . Hypercholesteremia   . Hypertensive heart and kidney disease with heart failure and with chronic kidney disease stage IV (HCC)    Family History  Problem Relation Age of Onset  . Hypertension Mother   . Stroke Father   . Diabetes Mellitus II Daughter   . Hypertension Daughter   . Heart attack Neg Hx    Past Surgical History:  Procedure Laterality Date  . CATARACT EXTRACTION W/PHACO Right 03/20/2014   Procedure: CATARACT EXTRACTION PHACO AND INTRAOCULAR LENS PLACEMENT (IOC);  Surgeon: [name], MD;  Location: [facility];  Service: Ophthalmology;  Laterality: Right;  .  failed knee surgery     on the right x 2  . JOINT REPLACEMENT     both knees  . left ankle orif     Social History   Socioeconomic History  . Marital status: Single    Spouse name: Not on file  . Number of children: Not on file  . Years of education: Not on file  . Highest education level: Not on file  Social Needs  . Financial resource strain: Not on file  . Food insecurity - worry: Not on file  . Food insecurity - inability: Not on file  . Transportation needs - medical: Not on file  . Transportation needs - non-medical: Not on file  Occupational History  . Not on file  Tobacco Use  . Smoking status: Never Smoker  . Smokeless tobacco: Never Used  Substance and  Sexual Activity  . Alcohol use: Yes    Alcohol/week: 0.6 oz    Types: 1 Standard drinks or equivalent per week    Comment: occasional  . Drug use: No  . Sexual activity: Not on file  Other Topics Concern  . Not on file  Social History Narrative   Retired   Worked at [employer] x 25 years ([place])   [employer] childcare business for 20 years   Divorced   6 kids (3 boys, 3 girls)   23 grandkids     Review of Systems: General: negative for chills, fever, night sweats or weight changes.  Cardiovascular: negative for chest pain, dyspnea on exertion, edema, orthopnea, palpitations, paroxysmal nocturnal dyspnea or shortness of breath Dermatological: negative for rash Respiratory: negative for cough or wheezing Urologic: negative for hematuria Abdominal: negative for nausea, vomiting, diarrhea, bright red blood per rectum, melena, or hematemesis Neurologic: negative for visual changes, syncope, or dizziness All other systems reviewed and are otherwise negative except as noted above.   Physical Exam:  Blood pressure (!) 190/100, pulse (!) 47, height 5' 5"  (1.651 m), weight 202 lb 12.8 oz (92 kg), SpO2 96 %.  General appearance: alert, cooperative, no distress and moderately obese Neck: no carotid bruit and no JVD Lungs: clear to auscultation bilaterally Heart: regular rate, bradycardia, no murmurs Extremities: extremities normal, atraumatic, no cyanosis or edema Pulses: 2+ and symmetric Skin: Skin color, texture, turgor normal. No rashes or lesions Neurologic: Grossly normal  EKG sinus brady 47 bpm -- personally reviewed   ASSESSMENT AND PLAN:   1. Dyspnea: likely multifactorial. Known diastolic dysfunction and describes what sounds to be orthopnea. However, she does not appear grossly volume overloaded on exam. Will check a BNP today. If abnormal, will increase Torsemide. Will also check a BMP and CBC. Bradycardia may also be contributing. HR 47 and sinus on EKG. We will reduce dose of  metoprolol from 50 mg BID to 25 mg BID. Reassess on return visit and further adjust BB if needed.   2. Chronic Diastolic HF: check BNP today given dyspnea. Will adjust diuretic dose based on BNP results. We discussed daily weights and low sodium diet.   3. Sinus Bradycardia: only symptom is dyspnea. Denies CP, dizziness, syncope/ near syncope and fatigue. She is on both a BB and clonidine. We will first reduce dose of metoprolol down to 25 mg BID and will reassess HR at f/u visit.   4. HTN: elevated in clinic today at 190/100. Currently asymptomatic. Pt notes she is nervous, plus has not yet taken her mid day dose of hydralazine and also skipped her Torsemide today to  avoid inconvience of frequent urination due to office visit. Pt instructed to take her hydralazine and Toresimide when she returns home. We will also add amlodipine 5 mg given we are reducing metoprolol for bradycardia. She is already maxed on hydralazine. Recheck at f/u visit.  Follow-Up in 2 weeks.   Joniel Graumann Ladoris Gene, MHS [facility] 09/15/2017 1:58 PM

## 2017-09-16 ENCOUNTER — Telehealth: Payer: Self-pay | Admitting: Cardiology

## 2017-09-16 DIAGNOSIS — I5032 Chronic diastolic (congestive) heart failure: Secondary | ICD-10-CM

## 2017-09-16 LAB — CBC
Hematocrit: 37.2 % (ref 34.0–46.6)
Hemoglobin: 11.2 g/dL (ref 11.1–15.9)
MCH: 25.3 pg — ABNORMAL LOW (ref 26.6–33.0)
MCHC: 30.1 g/dL — AB (ref 31.5–35.7)
MCV: 84 fL (ref 79–97)
Platelets: 165 10*3/uL (ref 150–379)
RBC: 4.43 x10E6/uL (ref 3.77–5.28)
RDW: 15.3 % (ref 12.3–15.4)
WBC: 5.7 10*3/uL (ref 3.4–10.8)

## 2017-09-16 LAB — BASIC METABOLIC PANEL
BUN / CREAT RATIO: 17 (ref 12–28)
BUN: 33 mg/dL — ABNORMAL HIGH (ref 8–27)
CO2: 25 mmol/L (ref 20–29)
Calcium: 8.8 mg/dL (ref 8.7–10.3)
Chloride: 106 mmol/L (ref 96–106)
Creatinine, Ser: 1.96 mg/dL — ABNORMAL HIGH (ref 0.57–1.00)
GFR calc Af Amer: 26 mL/min/{1.73_m2} — ABNORMAL LOW (ref >59)
GFR, EST NON AFRICAN AMERICAN: 23 mL/min/{1.73_m2} — AB (ref >59)
Glucose: 110 mg/dL — ABNORMAL HIGH (ref 65–99)
Potassium: 4.5 mmol/L (ref 3.5–5.2)
SODIUM: 146 mmol/L — AB (ref 134–144)

## 2017-09-16 LAB — PRO B NATRIURETIC PEPTIDE: NT-Pro BNP: 8192 pg/mL — ABNORMAL HIGH (ref 0–738)

## 2017-09-16 MED ORDER — TORSEMIDE 20 MG PO TABS: 40.0000 mg | ORAL_TABLET | Freq: Two times a day (BID) | ORAL | 3 refills | Status: DC

## 2017-09-16 NOTE — Telephone Encounter (Signed)
New message   Pt c/o medication issue:  1. Name of Medication: torsemide (DEMADEX) 20 MG tablet  2. How are you currently taking this medication (dosage and times per day)? Take 40 mg by mouth daily  3. Are you having a reaction (difficulty breathing--STAT)? no  4. What is your medication issue? Patient just needs to know the dosage she is suppose to take per day please call

## 2017-09-16 NOTE — Telephone Encounter (Signed)
I s/w pt in regards to her lab results. BNP 8,192. Pt advised to increase Torsemide to 40 mg BID x 5 days which at that time we will obtain BMET, PRO BNP and echo for further evaluation. Pt is agreeable to plan of care with verbal instructions and read back x 2 by the pt. Pt thanked me for my call.

## 2017-09-16 NOTE — Telephone Encounter (Signed)
-----   Message from Consuelo Pandy, [place] sent at 09/16/2017  2:59 PM EST ----- BNP (fluid marker) is significantly elevated. Shortness of breath is from acute CHF. Renal function and K is ok. Need to adjust diuretics. Increase Torsemide to 40 mg BID x 5 days and recheck BNP and BMP. Also recommend 2D echo to recheck pump function of heart.

## 2017-09-21 ENCOUNTER — Other Ambulatory Visit: Payer: Self-pay

## 2017-09-21 ENCOUNTER — Ambulatory Visit (HOSPITAL_COMMUNITY): Payer: Medicare HMO | Attending: Cardiovascular Disease

## 2017-09-21 ENCOUNTER — Other Ambulatory Visit: Payer: Medicare HMO | Admitting: *Deleted

## 2017-09-21 DIAGNOSIS — E785 Hyperlipidemia, unspecified: Secondary | ICD-10-CM | POA: Insufficient documentation

## 2017-09-21 DIAGNOSIS — I5032 Chronic diastolic (congestive) heart failure: Secondary | ICD-10-CM | POA: Diagnosis present

## 2017-09-21 DIAGNOSIS — I071 Rheumatic tricuspid insufficiency: Secondary | ICD-10-CM | POA: Insufficient documentation

## 2017-09-21 DIAGNOSIS — I110 Hypertensive heart disease with heart failure: Secondary | ICD-10-CM | POA: Diagnosis not present

## 2017-09-22 LAB — BASIC METABOLIC PANEL
BUN / CREAT RATIO: 20 (ref 12–28)
BUN: 43 mg/dL — AB (ref 8–27)
CO2: 27 mmol/L (ref 20–29)
CREATININE: 2.15 mg/dL — AB (ref 0.57–1.00)
Calcium: 9.3 mg/dL (ref 8.7–10.3)
Chloride: 100 mmol/L (ref 96–106)
GFR calc Af Amer: 23 mL/min/{1.73_m2} — ABNORMAL LOW (ref >59)
GFR, EST NON AFRICAN AMERICAN: 20 mL/min/{1.73_m2} — AB (ref >59)
GLUCOSE: 116 mg/dL — AB (ref 65–99)
Potassium: 4.3 mmol/L (ref 3.5–5.2)
SODIUM: 142 mmol/L (ref 134–144)

## 2017-09-22 LAB — PRO B NATRIURETIC PEPTIDE: NT-Pro BNP: 3019 pg/mL — ABNORMAL HIGH (ref 0–738)

## 2017-09-29 ENCOUNTER — Ambulatory Visit (INDEPENDENT_AMBULATORY_CARE_PROVIDER_SITE_OTHER): Payer: Medicare HMO | Admitting: Cardiology

## 2017-09-29 ENCOUNTER — Encounter: Payer: Self-pay | Admitting: Cardiology

## 2017-09-29 VITALS — BP 144/50 | HR 62 | Ht 65.0 in | Wt 201.0 lb

## 2017-09-29 DIAGNOSIS — I5033 Acute on chronic diastolic (congestive) heart failure: Secondary | ICD-10-CM

## 2017-09-29 NOTE — Progress Notes (Signed)
09/29/2017 [name]   [birth year]  [id number]  Primary Physician Antonietta Jewel, MD Primary Cardiologist: Dr. Curt Bears   Reason for Visit/CC: f/u for Acute on Chronic Diastolic HF  HPI:  84 y.o.femalewith a hx of chronic diastolic CHF,HTN, HL, CKD, prior DVT.  She was admitted in 5/17 with acute diastolic CHF. Echocardiogram demonstrated moderate LVH, normal LV function and moderate to severe diastolic dysfunction.She had a NST in 2002 that was normal.   Pt was seen on 09/15/17 given complaint of new dyspnea, both at rest and with exertion, that had been present for nearly 6 weeks. She denied CP. No wheezing. Also denied dizziness, fatigue, syncope/ near syncope. No weight gain but she noted orthopnea, and had to increase from 1-2 pillows at night.   EKG at that visit showed sinus bradycardia, 47 bpm. She had been on metoprolol 50 mg BID and clonidine 0.1 mg. BP was elevated at 190/100. However, she did not take her mid day dose of hydralazine nor her torsemide prior to that visit. Given her symptoms and bradycardia, her metoprolol dose was reduced down from 50 mg BID to 25 mg BID. Amlodipine 5 mg was added to regimen to help with BP to offset reduction in BB dose. BNP was also obtained at that office visit and was markedly abnormal at 8,192. She was instructed to increase her torsemide to 40 mg BID x 5 days. She presented back for repeat labs 7 days later. BNP was still elevated but improved to 3,019. She had a slight bump in SCr from 1.9 to 2.15. Echo was also obtained and showed normal LVEF, 60-65%, and G2DD and increased filling pressures. PA pressure was severely increased at 73 mm Hg.   She is back in clinic today with her daughter. She notes improvement in symptoms. Breathing significantly improved. No further orthopnea or PND. HR is improved after metoprolol dose was reduced, up from the 40s to low 60s. BP improved, now in the 009F systolic (previously 818E).    Current Meds    Medication Sig  . allopurinol (ZYLOPRIM) 100 MG tablet Take 100 mg by mouth 2 (two) times daily.  Marland Kitchen ALPRAZolam (XANAX) 0.25 MG tablet Take 0.25 mg by mouth 2 (two) times daily as needed.  Marland Kitchen amLODipine (NORVASC) 5 MG tablet Take 1 tablet (5 mg total) by mouth daily.  Marland Kitchen aspirin EC 81 MG tablet Take 81 mg by mouth daily.  . cloNIDine (CATAPRES) 0.1 MG tablet Take 0.1 mg by mouth daily.   Marland Kitchen docusate sodium (COLACE) 100 MG capsule Take 100 mg by mouth 3 (three) times a week.  . ferrous sulfate 325 (65 FE) MG tablet Take 325 mg by mouth 2 (two) times daily with a meal.   . hydrALAZINE (APRESOLINE) 100 MG tablet Take 100 mg by mouth 3 (three) times daily.   Marland Kitchen HYDROcodone-acetaminophen (NORCO) 10-325 MG tablet Take 1 tablet by mouth every 6 (six) hours as needed.  . lovastatin (MEVACOR) 20 MG tablet Take 20 mg by mouth daily.  . metoprolol tartrate (LOPRESSOR) 25 MG tablet Take 1 tablet (25 mg total) by mouth 2 (two) times daily.  . Multiple Vitamin (MULTIVITAMIN WITH MINERALS) TABS tablet Take 1 tablet by mouth daily.  Marland Kitchen torsemide (DEMADEX) 20 MG tablet Take 2 tablets (40 mg total) by mouth 2 (two) times daily.   Allergies  Allergen Reactions  . Naprosyn [Naproxen] Itching    Hot burning feeling   Past Medical History:  Diagnosis Date  . Arthritis   .  Chronic diastolic CHF (congestive heart failure) ([place])    a. Echo 01/02/16: Moderate concentric LVH, vigorous LV function, EF 65-70%, normal wall motion, grade 3 diastolic dysfunction, calcified aortic valve, MAC, mild MR, mild LAE  . CKD (chronic kidney disease)    Sees Dr. Moshe Cipro who took her off benicar in 2013.             Marland Kitchen Heart murmur    long time ago--no problems  . Hypercholesteremia   . Hypertensive heart and kidney disease with heart failure and with chronic kidney disease stage IV (HCC)    Family History  Problem Relation Age of Onset  . Hypertension Mother   . Stroke Father   . Diabetes Mellitus II Daughter   .  Hypertension Daughter   . Heart attack Neg Hx    Past Surgical History:  Procedure Laterality Date  . CATARACT EXTRACTION W/PHACO Right 03/20/2014   Procedure: CATARACT EXTRACTION PHACO AND INTRAOCULAR LENS PLACEMENT (IOC);  Surgeon: [name], MD;  Location: [place];  Service: Ophthalmology;  Laterality: Right;  . failed knee surgery     on the right x 2  . JOINT REPLACEMENT     both knees  . left ankle orif     Social History   Socioeconomic History  . Marital status: Single    Spouse name: Not on file  . Number of children: Not on file  . Years of education: Not on file  . Highest education level: Not on file  Social Needs  . Financial resource strain: Not on file  . Food insecurity - worry: Not on file  . Food insecurity - inability: Not on file  . Transportation needs - medical: Not on file  . Transportation needs - non-medical: Not on file  Occupational History  . Not on file  Tobacco Use  . Smoking status: Never Smoker  . Smokeless tobacco: Never Used  Substance and Sexual Activity  . Alcohol use: Yes    Alcohol/week: 0.6 oz    Types: 1 Standard drinks or equivalent per week    Comment: occasional  . Drug use: No  . Sexual activity: Not on file  Other Topics Concern  . Not on file  Social History Narrative   Retired   Worked at [employer] x 25 years ([place])   [employer] childcare business for 20 years   Divorced   6 kids (3 boys, 3 girls)   23 grandkids     Review of Systems: General: negative for chills, fever, night sweats or weight changes.  Cardiovascular: negative for chest pain, dyspnea on exertion, edema, orthopnea, palpitations, paroxysmal nocturnal dyspnea or shortness of breath Dermatological: negative for rash Respiratory: negative for cough or wheezing Urologic: negative for hematuria Abdominal: negative for nausea, vomiting, diarrhea, bright red blood per rectum, melena, or hematemesis Neurologic: negative for visual changes, syncope, or  dizziness All other systems reviewed and are otherwise negative except as noted above.   Physical Exam:  Blood pressure (!) 144/50, pulse 62, height _0  (1.651 m), weight 201 lb (91.2 kg), SpO2 96 %.  General appearance: alert, cooperative and no distress Neck: no carotid bruit and no JVD Lungs: clear to auscultation bilaterally Heart: regular rate and rhythm, S1, S2 normal, no murmur, click, rub or gallop Extremities: extremities normal, atraumatic, no cyanosis or edema Pulses: 2+ and symmetric Skin: Skin color, texture, turgor normal. No rashes or lesions Neurologic: Grossly normal  EKG Not performed  -- personally reviewed   ASSESSMENT AND  PLAN:   1. Chronic Systolic HF: BNP on 2/50/53 was 8,192 with echo showing normal LVEF, G2DD and elevated filling pressures. PA pressure 73 mmHg. Pt notes significant improvement after diuretic regimen increased. Torsemide increased to 40 mg BID. F/u BNP on 1/23 down to 3,019. We will repeat BNP and BMP today. Further guidance regarding diuretic regimen will be based on results.    2. HTN: better controlled today, in comparison to last OV. BP 144/50. Tolerating addition of amlodipine well. Continue current regimen.   3. Bradycardia: resolved. HR at last OV was 47 bpm. Metoprolol dose reduced from 50 mg BID to 25 mg BID. Resting HR 62 today. Exertional fatigue resolved.   4. CKD: followed by [facility]. Baseline SCr has been in the 2 range over the last several years. 2.15 after recent increase in Torsemide to 40 mg BID for a/c CHF. Will repeat BMP today to reasses. Further guidance regarding diuretic regimen will be based on results. She has f/u her her nephrologist in 2 weeks.    Follow-Up with primary cardiologist in 3 months.   [name], MHS [facility] 09/29/2017 2:11 PM

## 2017-09-29 NOTE — Patient Instructions (Addendum)
Medication Instructions:  Your physician recommends that you continue on your current medications as directed. Please refer to the Current Medication list given to you today.   Labwork: Bmet and BNP today  Testing/Procedures: None ordered   Follow-Up:Any Other Special Instructions Will Be Listed Below (If Applicable). Your physician recommends that you schedule a follow-up appointment in: 3 months Dr.Camnitz or Dr.End       If you need a refill on your cardiac medications before your next appointment, please call your pharmacy.

## 2017-09-30 ENCOUNTER — Telehealth: Payer: Self-pay | Admitting: *Deleted

## 2017-09-30 LAB — PRO B NATRIURETIC PEPTIDE: NT-Pro BNP: 1536 pg/mL — ABNORMAL HIGH (ref 0–738)

## 2017-09-30 NOTE — Telephone Encounter (Signed)
Recall placed in Epic for 34mo NP appt w/ Dr. Saunders Revel. Family notified.

## 2017-09-30 NOTE — Telephone Encounter (Signed)
-----   Message from Will Meredith Leeds, MD sent at 09/29/2017  4:45 PM EST ----- Since she has no EP issues, it seems more appropriate for her to follow up with a general cardiologist. End sounds perfect. ----- Message ----- From: Consuelo Pandy, PA-C Sent: 09/29/2017   4:27 PM To: Will Meredith Leeds, MD, [facility], RN  Dr. Curt Bears, you saw this pt in 2017 when she was admitted to the hospital for acute diastolic CHF. You were on call that weekend and was assigned her primary cardiologist but she has no EP issues. She has not been seen by a cardiologist since. She has seen Richardson Dopp and myself since that time. Do you want to follow her or should we reassign her to someone new, such as Dr. Saunders Revel? Roniesha Hollingshead do you mind putting in recall with Dr. Curt Bears or new pt visit with Dr. Saunders Revel in 3 months, once Dr. Ileana Ladd decides? Thanks.

## 2017-10-04 ENCOUNTER — Telehealth: Payer: Self-pay | Admitting: Cardiology

## 2017-10-04 NOTE — Telephone Encounter (Signed)
New Message   Patients daughter is returning call in reference to patients lab results. Please call to discuss.

## 2017-10-04 NOTE — Telephone Encounter (Signed)
Returned [employer], no DPR on file, so couldn't release any information to her. She will go to pts house and call when she can.

## 2017-10-04 NOTE — Telephone Encounter (Signed)
-----   Message from [place], [place] sent at 09/30/2017  2:18 PM EST ----- BNP level continues to improve, down from 8,000>>3,000>>1,500. Lab failed to collect BMP despite order. Thus no labs results to look at renal function. Recommend reducing dose of torsemide back down to 40 mg once daily and monitor weight closely. If weight starts to increase up over the weekend, then call our office back on Monday for advise as dose may need to be increased back to BID. Keep f/u with kidney doctor in 2 weeks. Watch sodium intake, <2 gm a day.

## 2017-10-14 ENCOUNTER — Encounter: Payer: Self-pay | Admitting: *Deleted

## 2017-10-24 ENCOUNTER — Telehealth: Payer: Self-pay | Admitting: Cardiology

## 2017-10-24 NOTE — Telephone Encounter (Signed)
New Message   Patient is returning call in reference to lab results. Please call to discuss.  

## 2017-10-24 NOTE — Telephone Encounter (Signed)
Spoke with patient's daughter in regards to her mother's labs and medication recommendations.. Patient's daughter verbalized understanding and will closely monitor her mother's weight and inform us of any significant increases.

## 2018-01-02 ENCOUNTER — Ambulatory Visit: Payer: Medicare HMO | Admitting: Internal Medicine

## 2018-01-02 VITALS — BP 142/80 | HR 50 | Ht 65.5 in | Wt 200.2 lb

## 2018-01-02 DIAGNOSIS — N184 Chronic kidney disease, stage 4 (severe): Secondary | ICD-10-CM | POA: Diagnosis not present

## 2018-01-02 DIAGNOSIS — I10 Essential (primary) hypertension: Secondary | ICD-10-CM

## 2018-01-02 DIAGNOSIS — I5032 Chronic diastolic (congestive) heart failure: Secondary | ICD-10-CM

## 2018-01-02 DIAGNOSIS — R0789 Other chest pain: Secondary | ICD-10-CM | POA: Diagnosis not present

## 2018-01-02 DIAGNOSIS — R001 Bradycardia, unspecified: Secondary | ICD-10-CM | POA: Diagnosis not present

## 2018-01-02 MED ORDER — AMLODIPINE BESYLATE 10 MG PO TABS: 10.0000 mg | ORAL_TABLET | Freq: Every day | ORAL | 3 refills | Status: DC

## 2018-01-02 NOTE — Patient Instructions (Addendum)
Medication Instructions:   INCREASE AMLODIPINE to 10mg  by mouth daily   -- If you need a refill on your cardiac medications before your next appointment, please call your pharmacy. --  Labwork: None ordered  Testing/Procedures: None ordered  Follow-Up: Your physician wants you to follow-up in: 6 months with Dr. Saunders Revel.    You will receive a reminder letter in the mail two months in advance. If you don't receive a letter, please call our office to schedule the follow-up appointment.  Thank you for choosing [facility]!!    Any Other Special Instructions Will Be Listed Below (If Applicable).      DASH Eating Plan DASH stands for "Dietary Approaches to Stop Hypertension." The DASH eating plan is a healthy eating plan that has been shown to reduce high blood pressure (hypertension). It may also reduce your risk for type 2 diabetes, heart disease, and stroke. The DASH eating plan may also help with weight loss. What are tips for following this plan? General guidelines  Avoid eating more than 2,300 mg (milligrams) of salt (sodium) a day. If you have hypertension, you may need to reduce your sodium intake to 1,500 mg a day.  Limit alcohol intake to no more than 1 drink a day for nonpregnant women and 2 drinks a day for men. One drink equals 12 oz of beer, 5 oz of wine, or 1 oz of hard liquor.  Work with your health care provider to maintain a healthy body weight or to lose weight. Ask what an ideal weight is for you.  Get at least 30 minutes of exercise that causes your heart to beat faster (aerobic exercise) most days of the week. Activities may include walking, swimming, or biking.  Work with your health care provider or diet and nutrition specialist (dietitian) to adjust your eating plan to your individual calorie needs. Reading food labels  Check food labels for the amount of sodium per serving. Choose foods with less than 5 percent of the Daily Value of sodium. Generally,  foods with less than 300 mg of sodium per serving fit into this eating plan.  To find whole grains, look for the word "whole" as the first word in the ingredient list. Shopping  Buy products labeled as "low-sodium" or "no salt added."  Buy fresh foods. Avoid canned foods and premade or frozen meals. Cooking  Avoid adding salt when cooking. Use salt-free seasonings or herbs instead of table salt or sea salt. Check with your health care provider or pharmacist before using salt substitutes.  Do not fry foods. Cook foods using healthy methods such as baking, boiling, grilling, and broiling instead.  Cook with heart-healthy oils, such as olive, canola, soybean, or sunflower oil. Meal planning   Eat a balanced diet that includes: ? 5 or more servings of fruits and vegetables each day. At each meal, try to fill half of your plate with fruits and vegetables. ? Up to 6-8 servings of whole grains each day. ? Less than 6 oz of lean meat, poultry, or fish each day. A 3-oz serving of meat is about the same size as a deck of cards. One egg equals 1 oz. ? 2 servings of low-fat dairy each day. ? A serving of nuts, seeds, or beans 5 times each week. ? Heart-healthy fats. Healthy fats called Omega-3 fatty acids are found in foods such as flaxseeds and coldwater fish, like sardines, salmon, and mackerel.  Limit how much you eat of the following: ? Canned or  prepackaged foods. ? Food that is high in trans fat, such as fried foods. ? Food that is high in saturated fat, such as fatty meat. ? Sweets, desserts, sugary drinks, and other foods with added sugar. ? Full-fat dairy products.  Do not salt foods before eating.  Try to eat at least 2 vegetarian meals each week.  Eat more home-cooked food and less restaurant, buffet, and fast food.  When eating at a restaurant, ask that your food be prepared with less salt or no salt, if possible. What foods are recommended? The items listed may not be a  complete list. Talk with your dietitian about what dietary choices are best for you. Grains Whole-grain or whole-wheat bread. Whole-grain or whole-wheat pasta. Brown rice. [place]ena Morrow. Bulgur. Whole-grain and low-sodium cereals. Pita bread. Low-fat, low-sodium crackers. Whole-wheat flour tortillas. Vegetables Fresh or frozen vegetables (raw, steamed, roasted, or grilled). Low-sodium or reduced-sodium tomato and vegetable juice. Low-sodium or reduced-sodium tomato sauce and tomato paste. Low-sodium or reduced-sodium canned vegetables. Fruits All fresh, dried, or frozen fruit. Canned fruit in natural juice (without added sugar). Meat and other protein foods Skinless chicken or [place]. Ground chicken or [place]. Pork with fat trimmed off. Fish and seafood. Egg whites. Dried beans, peas, or lentils. Unsalted nuts, nut butters, and seeds. Unsalted canned beans. Lean cuts of beef with fat trimmed off. Low-sodium, lean deli meat. Dairy Low-fat (1%) or fat-free (skim) milk. Fat-free, low-fat, or reduced-fat cheeses. Nonfat, low-sodium ricotta or cottage cheese. Low-fat or nonfat yogurt. Low-fat, low-sodium cheese. Fats and oils Soft margarine without trans fats. Vegetable oil. Low-fat, reduced-fat, or light mayonnaise and salad dressings (reduced-sodium). Canola, safflower, olive, soybean, and sunflower oils. Avocado. Seasoning and other foods Herbs. Spices. Seasoning mixes without salt. Unsalted popcorn and pretzels. Fat-free sweets. What foods are not recommended? The items listed may not be a complete list. Talk with your dietitian about what dietary choices are best for you. Grains Baked goods made with fat, such as croissants, muffins, or some breads. Dry pasta or rice meal packs. Vegetables Creamed or fried vegetables. Vegetables in a cheese sauce. Regular canned vegetables (not low-sodium or reduced-sodium). Regular canned tomato sauce and paste (not low-sodium or reduced-sodium). Regular  tomato and vegetable juice (not low-sodium or reduced-sodium). Angie Fava. Olives. Fruits Canned fruit in a light or heavy syrup. Fried fruit. Fruit in cream or butter sauce. Meat and other protein foods Fatty cuts of meat. Ribs. Fried meat. Berniece Salines. Sausage. Bologna and other processed lunch meats. Salami. Fatback. Hotdogs. Bratwurst. Salted nuts and seeds. Canned beans with added salt. Canned or smoked fish. Whole eggs or egg yolks. Chicken or [place] with skin. Dairy Whole or 2% milk, cream, and half-and-half. Whole or full-fat cream cheese. Whole-fat or sweetened yogurt. Full-fat cheese. Nondairy creamers. Whipped toppings. Processed cheese and cheese spreads. Fats and oils Butter. Stick margarine. Lard. Shortening. Ghee. Bacon fat. Tropical oils, such as coconut, palm kernel, or palm oil. Seasoning and other foods Salted popcorn and pretzels. Onion salt, garlic salt, seasoned salt, table salt, and sea salt. Worcestershire sauce. Tartar sauce. Barbecue sauce. Teriyaki sauce. Soy sauce, including reduced-sodium. Steak sauce. Canned and packaged gravies. Fish sauce. Oyster sauce. Cocktail sauce. Horseradish that you find on the shelf. Ketchup. Mustard. Meat flavorings and tenderizers. Bouillon cubes. Hot sauce and Tabasco sauce. Premade or packaged marinades. Premade or packaged taco seasonings. Relishes. Regular salad dressings. Where to find more information:  National Heart, Lung, and [place]: https://wilson-eaton.com/  American Heart Association: www.heart.org Summary  The DASH  eating plan is a healthy eating plan that has been shown to reduce high blood pressure (hypertension). It may also reduce your risk for type 2 diabetes, heart disease, and stroke.  With the DASH eating plan, you should limit salt (sodium) intake to 2,300 mg a day. If you have hypertension, you may need to reduce your sodium intake to 1,500 mg a day.  When on the DASH eating plan, aim to eat more fresh fruits and  vegetables, whole grains, lean proteins, low-fat dairy, and heart-healthy fats.  Work with your health care provider or diet and nutrition specialist (dietitian) to adjust your eating plan to your individual calorie needs. This information is not intended to replace advice given to you by your health care provider. Make sure you discuss any questions you have with your health care provider. Document Released: 08/05/2011 Document Revised: 08/09/2016 Document Reviewed: 08/09/2016 Elsevier Interactive Patient Education  [employer].

## 2018-01-02 NOTE — Progress Notes (Signed)
Follow-up Outpatient Visit Date: 01/02/2018  Primary Care Provider: Antonietta Jewel, MD [address] [place] [address]  Chief Complaint: Shortness of breath  HPI:  [name] is a 84 y.o. year-old female with history of HFpEF, hypertension, hyperlipidemia, CKD, and DVT, who presents for follow-up of chronic diastolic heart failure.  She has been previously seen [facility] and [facility] under the direction of Dr. Curt Bears.  She was last seen in January by Lyda Jester, PA, at which time she was feeling better after reduction in metoprolol in the setting of sinus bradycardia at hier prior visit earlier in the month.  Amlodipine was added and torsemide increased to 40 mg BID x 5 days.  Today, [name] presents with 2 of her daughters, who provide much of the history.  [name] continues to have exertional dyspnea with light housework.  She feels it is stable to slightly better compared to her last visit in January.  She had significant diuresis on torsemide 40 mg twice daily and was advised to cut down to 40 mg daily by her nephrologist, [name].  She still has some intermittent leg swelling, though this overall has been well controlled.  She denies significant weight gain, as well as edema.  She notes occasional sharp, left-sided chest pain lasting a few seconds when she "walks a lot."  She is able to walk about 100 feet before needing to stop, predominantly due to shortness of breath.  [name] monitors her blood pressure several times a week and notes that it is typically even higher than what we found today, with average readings of 160-170/40-60.  Heart rate typically stays in the 50s.  --------------------------------------------------------------------------------------------------  Past Medical History:  Diagnosis Date  . Arthritis   . Chronic diastolic CHF (congestive heart failure) ([name])    a. Echo 01/02/16: Moderate concentric LVH, vigorous LV  function, EF 65-70%, normal wall motion, grade 3 diastolic dysfunction, calcified aortic valve, MAC, mild MR, mild LAE  . CKD (chronic kidney disease)    Sees [name] who took her off benicar in 2013.             Marland Kitchen Heart murmur    long time ago--no problems  . Hypercholesteremia   . Hypertensive heart and kidney disease with heart failure and with chronic kidney disease stage IV [facility])    Past Surgical History:  Procedure Laterality Date  . CATARACT EXTRACTION W/PHACO Right 03/20/2014   Procedure: CATARACT EXTRACTION PHACO AND INTRAOCULAR LENS PLACEMENT (IOC);  Surgeon: [name], MD;  Location: [place];  Service: Ophthalmology;  Laterality: Right;  . failed knee surgery     on the right x 2  . JOINT REPLACEMENT     both knees  . left ankle orif      Current Meds  Medication Sig  . allopurinol (ZYLOPRIM) 100 MG tablet Take 100 mg by mouth 2 (two) times daily.  Marland Kitchen ALPRAZolam (XANAX) 0.25 MG tablet Take 0.25 mg by mouth 2 (two) times daily as needed.  Marland Kitchen aspirin EC 81 MG tablet Take 81 mg by mouth daily.  . cloNIDine (CATAPRES) 0.1 MG tablet Take 0.1 mg by mouth 2 (two) times daily.   Marland Kitchen docusate sodium (COLACE) 100 MG capsule Take 100 mg by mouth 3 (three) times a week.  . ferrous sulfate 325 (65 FE) MG tablet Take 325 mg by mouth 2 (two) times daily with a meal.   . hydrALAZINE (APRESOLINE) 100 MG tablet Take 100  mg by mouth 3 (three) times daily.   Marland Kitchen HYDROcodone-acetaminophen (NORCO) 10-325 MG tablet Take 1 tablet by mouth every 6 (six) hours as needed.  . lovastatin (MEVACOR) 20 MG tablet Take 20 mg by mouth daily.  . metoprolol tartrate (LOPRESSOR) 25 MG tablet Take 25 mg by mouth 2 (two) times daily.  . Multiple Vitamin (MULTIVITAMIN WITH MINERALS) TABS tablet Take 1 tablet by mouth daily.  Marland Kitchen torsemide (DEMADEX) 20 MG tablet Take 40 mg by mouth daily.  . [DISCONTINUED] amLODipine (NORVASC) 5 MG tablet Take 5 mg by mouth daily.    Allergies: Naprosyn [naproxen]  Social  History   Tobacco Use  . Smoking status: Never Smoker  . Smokeless tobacco: Never Used  Substance Use Topics  . Alcohol use: Yes    Alcohol/week: 0.6 oz    Types: 1 Standard drinks or equivalent per week    Comment: occasional  . Drug use: No    Family History  Problem Relation Age of Onset  . Hypertension Mother   . Stroke Father   . Diabetes Mellitus II Daughter   . Hypertension Daughter   . Heart attack Neg Hx     Review of Systems: Review of Systems  Constitutional: Negative.   HENT: Positive for hearing loss.   Eyes: Negative.   Respiratory: Positive for shortness of breath.   Cardiovascular: Negative.   Gastrointestinal: Negative.   Genitourinary: Negative.   Musculoskeletal: Positive for back pain, joint pain and myalgias.  Skin: Negative.   Neurological: Positive for weakness (balance/walking problems).  Endo/Heme/Allergies: Negative.   Psychiatric/Behavioral: Negative.    --------------------------------------------------------------------------------------------------  Physical Exam: BP (!) 142/80   Pulse (!) 50   Ht 5' 5.5" (1.664 m)   Wt 200 lb 3.2 oz (90.8 kg)   BMI 32.81 kg/m   General: Elderly woman, seated comfortably in the exam room.  She is accompanied by 2 daughters. HEENT: No conjunctival pallor or scleral icterus. Moist mucous membranes.  OP clear. Neck: Supple without lymphadenopathy, thyromegaly, JVD, or HJR.  Bilateral carotid bruits present. Lungs: Normal work of breathing. Clear to auscultation bilaterally without wheezes or crackles. Heart: Bradycardic but regular with 2/6 systolic murmur loudest at the right upper sternal border.  Nondisplaced PMI. Abd: Bowel sounds present. Soft, NT/ND without hepatosplenomegaly Ext: 1+ pretibial edema bilaterally to the proximal calves.  2+ radial pulses bilaterally. Skin: Warm and dry without rash.  EKG: Sinus bradycardia (heart rate 50 bpm) with borderline LVH and early repolarization.  Lab  Results  Component Value Date   WBC 5.7 09/15/2017   HGB 11.2 09/15/2017   HCT 37.2 09/15/2017   MCV 84 09/15/2017   PLT 165 09/15/2017    Lab Results  Component Value Date   NA 142 09/21/2017   K 4.3 09/21/2017   CL 100 09/21/2017   CO2 27 09/21/2017   BUN 43 (H) 09/21/2017   CREATININE 2.15 (H) 09/21/2017   GLUCOSE 116 (H) 09/21/2017   ALT 16 02/17/2016    No results found for: CHOL, HDL, LDLCALC, LDLDIRECT, TRIG, CHOLHDL  --------------------------------------------------------------------------------------------------  ASSESSMENT AND PLAN: Chronic diastolic heart failure [name] has stable to slightly improved NYHA class III heart failure symptoms and she still has some lower extremity edema that she attributes to being on her feet a lot today.  Her weight is down slightly from prior visits.  We discussed escalation of diuretic therapy, but in light of her chronic kidney disease and age, [name] is hesitant to do this.  I  have advised her to continue taking torsemide 40 mg daily, with an additional dose if she gains more than 2 pounds a day or 5 pounds in a week.  The importance of sodium restriction was reinforced.  Chronic kidney disease stage IV Creatinine at baseline upon last check in January.  Avoid nephrotoxic agents.  Defer further management to [name].  Hypertension Blood pressure modestly elevated today, and actually better than frequent home readings.  We have discussed the importance of blood pressure control and have agreed to increase amlodipine to 10 mg daily.  She is scheduled to follow-up with her PCP in the next week.  I will defer further changes to Dr. Sheryle Black.  Atypical chest pain Ms. Coppa reports occasional sharp chest pains with activity lasting a few seconds at a time.  Certainly, she is at risk for coronary artery disease.  We discussed the risks and benefits of further evaluation; given her age, Ms. Lavery and her family would  like to defer additional testing at this time.  It is reasonable to continue with medical therapy including amlodipine, metoprolol, and lovastatin.  Sinus bradycardia Persistent and likely driven by rate controlling agents, including metoprolol and clonidine.  Since Ms. Winker is not particularly symptomatic, we will continue her current medications.  If dyspnea were to worsen, we may need to consider de-escalating or stopping metoprolol to see if this helps improve her symptoms.  Follow-up: Return to clinic in 6 months.  Nelva Bush, MD 01/03/2018 8:45 PM

## 2018-01-03 ENCOUNTER — Encounter: Payer: Self-pay | Admitting: Internal Medicine

## 2018-01-03 DIAGNOSIS — R001 Bradycardia, unspecified: Secondary | ICD-10-CM | POA: Insufficient documentation

## 2018-01-03 DIAGNOSIS — I10 Essential (primary) hypertension: Secondary | ICD-10-CM | POA: Insufficient documentation

## 2018-01-03 DIAGNOSIS — R0789 Other chest pain: Secondary | ICD-10-CM | POA: Insufficient documentation

## 2018-01-24 ENCOUNTER — Telehealth: Payer: Self-pay | Admitting: Internal Medicine

## 2018-01-24 NOTE — Telephone Encounter (Signed)
New Message    Pt c/o BP issue:  1. What are your last 5 BP readings? 201/59 2. Are you having any other symptoms (ex. Dizziness, headache, blurred vision, passed out)? Swelling in her feet  3. What is your medication issue? No    Pt c/o swelling: STAT is pt has developed SOB within 24 hours  How much weight have you gained and in what time span? 0 1) If swelling, where is the swelling located? swelling in feet   2) Are you currently taking a fluid pill? yes torsemide (DEMADEX) 20 MG tablet  3) Are you currently SOB? no  4) Do you have a log of your daily weights (if so, list)? Yes not available but its been no more than a 2lb gain   5) Have you gained 3 pounds in a day or 5 pounds in a week? no  6) Have you traveled recently? No

## 2018-01-24 NOTE — Telephone Encounter (Signed)
Spoke with patient's daughter [name]) in regards to patient's BP and swelling in the feet.  When she takes her morning medication for BP, it usually takes until afternoon to control her BP.    Presently it is 201/59, never really this high.  She is not SOB or having CP.  She does having swelling in her feet recognized as 2+ pitting edema and cold.  She is taking torsemide 20 mg daily, but on 5/6 was prescribed 40 mg for a period of 5 days because of her SOB.  Her weight remains constant.  Please advise, thank you.

## 2018-01-25 NOTE — Telephone Encounter (Signed)
Spoke to patient who is feeling much better.  I gave her Dr Darnelle Bos recommendations (Increase Torsemide 40 mg bid x 3 days) and come in for follow up appt.  I could not get her in until Monday @ 11:40 (6/3) Dr End first available, unfortunately nothing this week.  She verbalized understanding.

## 2018-01-25 NOTE — Telephone Encounter (Signed)
I recommend increasing torsemide to 40 mg BID x 3 days and having the patient be seen in our office sometime this week.  If she has worsening edema or develops shortness of breath, chest pain, or neurologic changes, she should seek immediate medical attention in the ED.  Nelva Bush, MD [facility] Pager: [phone]

## 2018-01-30 ENCOUNTER — Encounter: Payer: Self-pay | Admitting: Internal Medicine

## 2018-01-30 ENCOUNTER — Ambulatory Visit (INDEPENDENT_AMBULATORY_CARE_PROVIDER_SITE_OTHER): Payer: Medicare HMO | Admitting: Internal Medicine

## 2018-01-30 VITALS — BP 150/50 | HR 52 | Ht 65.5 in | Wt 202.0 lb

## 2018-01-30 DIAGNOSIS — I10 Essential (primary) hypertension: Secondary | ICD-10-CM

## 2018-01-30 DIAGNOSIS — N184 Chronic kidney disease, stage 4 (severe): Secondary | ICD-10-CM | POA: Diagnosis not present

## 2018-01-30 DIAGNOSIS — I5032 Chronic diastolic (congestive) heart failure: Secondary | ICD-10-CM

## 2018-01-30 DIAGNOSIS — R001 Bradycardia, unspecified: Secondary | ICD-10-CM | POA: Diagnosis not present

## 2018-01-30 MED ORDER — TORSEMIDE 20 MG PO TABS: ORAL_TABLET | ORAL | 3 refills | Status: DC

## 2018-01-30 NOTE — Patient Instructions (Addendum)
Medication Instructions:  START taking Torsemide 40 mg twice per day.   -- If you need a refill on your cardiac medications before your next appointment, please call your pharmacy. --  Labwork:TODAY  CMP/TSH   Testing/Procedures: None ordered  Follow-Up: Your physician wants you to follow-up in: 1 MONTH with Dr. Saunders Revel or APP   Thank you for choosing [facility]!!    Any Other Special Instructions Will Be Listed Below (If Applicable).  Compression Stockings to be wear during the day.  Located at a medical supply company.

## 2018-01-30 NOTE — Progress Notes (Signed)
Follow-up Outpatient Visit Date: 01/30/2018  Primary Care Provider: Antonietta Jewel, MD [address] [place] [address]  Chief Complaint: Leg swelling  HPI:  Ms. Glidden is a 84 y.o. year-old female with history of HFpEF, hypertension, hyperlipidemia, CKD, and DVT, who presents for evaluation of worsening lower extremity edema.  I last saw her a month ago, at which time she reported stable exertional dyspnea and no significant edema.  He family contacted our office last week regarding elevated blood pressures and increased leg edema.  She was advised to increase torsemide to 40 mg BID x 3 days and then return to 40 mg daily.  While taking torsemide 40 mg twice daily, Ms. Petrik noted significant improvement in her leg swelling.  However, since returning back to once daily 2 days ago, her leg swelling has worsened again.  She notes that her blood pressure remains elevated at home, with systolic readings sometimes up to 180 mmHg.  She also noticed a small bruise on the dorsum of the left foot a few days ago.  It has continued to expand in size.  She denies trauma to the area.  Ms. Michalski denies chest pain, palpitations, lightheadedness, and orthopnea.  She is scheduled to follow-up with Dr. Moshe Cipro in about 2 weeks.  --------------------------------------------------------------------------------------------------  Past Medical History:  Diagnosis Date  . Arthritis   . Chronic diastolic CHF (congestive heart failure) ([place])    a. Echo 01/02/16: Moderate concentric LVH, vigorous LV function, EF 65-70%, normal wall motion, grade 3 diastolic dysfunction, calcified aortic valve, MAC, mild MR, mild LAE  . CKD (chronic kidney disease)    Sees Dr. Moshe Cipro who took her off benicar in 2013.             Marland Kitchen Heart murmur    long time ago--no problems  . Hypercholesteremia   . Hypertensive heart and kidney disease with heart failure and with chronic kidney disease stage IV [facility])     Past Surgical History:  Procedure Laterality Date  . CATARACT EXTRACTION W/PHACO Right 03/20/2014   Procedure: CATARACT EXTRACTION PHACO AND INTRAOCULAR LENS PLACEMENT (IOC);  Surgeon: [name], MD;  Location: [facility];  Service: Ophthalmology;  Laterality: Right;  . failed knee surgery     on the right x 2  . JOINT REPLACEMENT     both knees  . left ankle orif      Current Meds  Medication Sig  . allopurinol (ZYLOPRIM) 100 MG tablet Take 100 mg by mouth 2 (two) times daily.  Marland Kitchen ALPRAZolam (XANAX) 0.25 MG tablet Take 0.25 mg by mouth 2 (two) times daily as needed.  Marland Kitchen amLODipine (NORVASC) 10 MG tablet Take 1 tablet (10 mg total) by mouth daily.  Marland Kitchen aspirin EC 81 MG tablet Take 81 mg by mouth daily.  . cloNIDine (CATAPRES) 0.1 MG tablet Take 0.1 mg by mouth 2 (two) times daily.   Marland Kitchen docusate sodium (COLACE) 100 MG capsule Take 100 mg by mouth 3 (three) times a week.  . ferrous sulfate 325 (65 FE) MG tablet Take 325 mg by mouth 2 (two) times daily with a meal.   . hydrALAZINE (APRESOLINE) 100 MG tablet Take 100 mg by mouth 3 (three) times daily.   Marland Kitchen HYDROcodone-acetaminophen (NORCO) 10-325 MG tablet Take 1 tablet by mouth every 6 (six) hours as needed.  . lovastatin (MEVACOR) 20 MG tablet Take 20 mg by mouth daily.  . metoprolol tartrate (LOPRESSOR) 25 MG tablet Take 25 mg by mouth 2 (  two) times daily.  . Multiple Vitamin (MULTIVITAMIN WITH MINERALS) TABS tablet Take 1 tablet by mouth daily.  Marland Kitchen torsemide (DEMADEX) 20 MG tablet Take 40 mg by mouth daily.    Allergies: Naprosyn [naproxen]  Social History   Tobacco Use  . Smoking status: Never Smoker  . Smokeless tobacco: Never Used  Substance Use Topics  . Alcohol use: Yes    Alcohol/week: 0.6 oz    Types: 1 Standard drinks or equivalent per week    Comment: occasional  . Drug use: No    Family History  Problem Relation Age of Onset  . Hypertension Mother   . Stroke Father   . Diabetes Mellitus II Daughter   . Hypertension  Daughter   . Heart attack Neg Hx     Review of Systems: A 12-system review of systems was performed and was negative except as noted in the HPI.  --------------------------------------------------------------------------------------------------  Physical Exam: BP (!) 150/50 (BP Location: Left Arm)   Pulse (!) 52   Ht 5' 5.5" (1.664 m)   Wt 202 lb (91.6 kg)   SpO2 97%   BMI 33.10 kg/m   General: NAD.  Accompanied by her daughters. HEENT: No conjunctival pallor or scleral icterus. Moist mucous membranes.  OP clear. Neck: Supple without lymphadenopathy, thyromegaly, JVD, or HJR. No carotid bruit. Lungs: Normal work of breathing. Clear to auscultation bilaterally without wheezes or crackles. Heart: Regular rate and rhythm without murmurs, rubs, or gallops. Non-displaced PMI. Abd: Bowel sounds present. Soft, NT/ND without hepatosplenomegaly Ext: 2+ distal calf edema extending to the feet bilaterally. Skin: Warm and dry without rash.  Ecchymosis noted along the dorsum of the left foot.  No evidence of ulceration or skin breakdown.  No drainage.   Lab Results  Component Value Date   WBC 5.7 09/15/2017   HGB 11.2 09/15/2017   HCT 37.2 09/15/2017   MCV 84 09/15/2017   PLT 165 09/15/2017    Lab Results  Component Value Date   NA 142 09/21/2017   K 4.3 09/21/2017   CL 100 09/21/2017   CO2 27 09/21/2017   BUN 43 (H) 09/21/2017   CREATININE 2.15 (H) 09/21/2017   GLUCOSE 116 (H) 09/21/2017   ALT 16 02/17/2016    No results found for: CHOL, HDL, LDLCALC, LDLDIRECT, TRIG, CHOLHDL  --------------------------------------------------------------------------------------------------  ASSESSMENT AND PLAN: Chronic diastolic heart failure Unfortunately, Ms. Insco has continued to have difficulties maintaining a euvolemic state.  She seems to do well with torsemide 40 mg twice daily, though there has previously been concerned about the effects of this dose on her renal function.   Ultimately, I think she will need twice daily dosing to prevent fluid retention.  We will therefore continue torsemide 40 mg twice daily.  I have encouraged her to continue a low-sodium diet and to elevate her legs when possible.  I have also suggested she try using over-the-counter compression stockings to help with her swelling.  Hypertension Initial blood pressure reading today was normal, though repeat checks in both arms revealed mildly to moderately elevated blood pressure with a 10 mmHg systolic difference from right to left.  We are somewhat limited in medication options, given chronic kidney disease and bradycardia.  We have agreed to allow a degree of permissive hypertension.  Hopefully, with more aggressive diuresis, her blood pressure will also improve.  Chronic kidney disease stage IV In the setting of recent escalation of torsemide, I will check a complete metabolic panel today with plans for increased torsemide  to 40 mg twice daily.  I will defer further management to Dr. Moshe Cipro.  Sinus bradycardia Most likely due to a medications, including metoprolol and clonidine.  I will check a TSH, as it has been several years since this was last checked and chronic leg edema may also be a manifestation of hypothyroidism.  Follow-up: Return to clinic in 1 month.  Nelva Bush, MD 01/30/2018 12:03 PM

## 2018-01-31 ENCOUNTER — Encounter: Payer: Self-pay | Admitting: Internal Medicine

## 2018-01-31 LAB — COMPREHENSIVE METABOLIC PANEL
A/G RATIO: 1.4 (ref 1.2–2.2)
ALT: 12 IU/L (ref 0–32)
AST: 21 IU/L (ref 0–40)
Albumin: 3.9 g/dL (ref 3.5–4.7)
Alkaline Phosphatase: 71 IU/L (ref 39–117)
BILIRUBIN TOTAL: 0.3 mg/dL (ref 0.0–1.2)
BUN/Creatinine Ratio: 23 (ref 12–28)
BUN: 50 mg/dL — AB (ref 8–27)
CHLORIDE: 100 mmol/L (ref 96–106)
CO2: 24 mmol/L (ref 20–29)
Calcium: 9.2 mg/dL (ref 8.7–10.3)
Creatinine, Ser: 2.14 mg/dL — ABNORMAL HIGH (ref 0.57–1.00)
GFR calc Af Amer: 23 mL/min/{1.73_m2} — ABNORMAL LOW (ref >59)
GFR calc non Af Amer: 20 mL/min/{1.73_m2} — ABNORMAL LOW (ref >59)
GLUCOSE: 93 mg/dL (ref 65–99)
Globulin, Total: 2.8 g/dL (ref 1.5–4.5)
POTASSIUM: 5.1 mmol/L (ref 3.5–5.2)
Sodium: 139 mmol/L (ref 134–144)
Total Protein: 6.7 g/dL (ref 6.0–8.5)

## 2018-01-31 LAB — TSH: TSH: 3.98 u[IU]/mL (ref 0.450–4.500)

## 2018-02-24 ENCOUNTER — Ambulatory Visit: Payer: Medicare HMO | Admitting: Internal Medicine

## 2018-05-02 ENCOUNTER — Encounter (HOSPITAL_COMMUNITY): Payer: Self-pay | Admitting: Emergency Medicine

## 2018-05-02 ENCOUNTER — Emergency Department (HOSPITAL_COMMUNITY): Payer: Medicare HMO

## 2018-05-02 ENCOUNTER — Other Ambulatory Visit: Payer: Self-pay

## 2018-05-02 ENCOUNTER — Emergency Department (HOSPITAL_COMMUNITY)
Admission: EM | Admit: 2018-05-02 | Discharge: 2018-05-03 | Disposition: A | Discharge status: Home / Self Care | Payer: Medicare HMO | Attending: Emergency Medicine | Admitting: Emergency Medicine

## 2018-05-02 DIAGNOSIS — I130 Hypertensive heart and chronic kidney disease with heart failure and stage 1 through stage 4 chronic kidney disease, or unspecified chronic kidney disease: Secondary | ICD-10-CM | POA: Insufficient documentation

## 2018-05-02 DIAGNOSIS — Z86718 Personal history of other venous thrombosis and embolism: Secondary | ICD-10-CM | POA: Diagnosis not present

## 2018-05-02 DIAGNOSIS — Z79899 Other long term (current) drug therapy: Secondary | ICD-10-CM | POA: Diagnosis not present

## 2018-05-02 DIAGNOSIS — Z96653 Presence of artificial knee joint, bilateral: Secondary | ICD-10-CM | POA: Diagnosis not present

## 2018-05-02 DIAGNOSIS — Z7982 Long term (current) use of aspirin: Secondary | ICD-10-CM | POA: Diagnosis not present

## 2018-05-02 DIAGNOSIS — I5032 Chronic diastolic (congestive) heart failure: Secondary | ICD-10-CM | POA: Diagnosis not present

## 2018-05-02 DIAGNOSIS — N184 Chronic kidney disease, stage 4 (severe): Secondary | ICD-10-CM | POA: Insufficient documentation

## 2018-05-02 DIAGNOSIS — I509 Heart failure, unspecified: Secondary | ICD-10-CM

## 2018-05-02 DIAGNOSIS — R0602 Shortness of breath: Secondary | ICD-10-CM | POA: Diagnosis present

## 2018-05-02 LAB — COMPREHENSIVE METABOLIC PANEL
ALBUMIN: 3.5 g/dL (ref 3.5–5.0)
ALT: 13 U/L (ref 0–44)
AST: 26 U/L (ref 15–41)
Alkaline Phosphatase: 74 U/L (ref 38–126)
Anion gap: 12 (ref 5–15)
BUN: 73 mg/dL — AB (ref 8–23)
CHLORIDE: 103 mmol/L (ref 98–111)
CO2: 25 mmol/L (ref 22–32)
CREATININE: 2.63 mg/dL — AB (ref 0.44–1.00)
Calcium: 9.2 mg/dL (ref 8.9–10.3)
GFR calc Af Amer: 18 mL/min — ABNORMAL LOW (ref >60)
GFR, EST NON AFRICAN AMERICAN: 15 mL/min — AB (ref >60)
GLUCOSE: 101 mg/dL — AB (ref 70–99)
POTASSIUM: 4.1 mmol/L (ref 3.5–5.1)
SODIUM: 140 mmol/L (ref 135–145)
Total Bilirubin: 0.8 mg/dL (ref 0.3–1.2)
Total Protein: 6.6 g/dL (ref 6.5–8.1)

## 2018-05-02 LAB — I-STAT TROPONIN, ED: TROPONIN I, POC: 0 ng/mL (ref 0.00–0.08)

## 2018-05-02 LAB — CBC WITH DIFFERENTIAL/PLATELET
Abs Immature Granulocytes: 0 10*3/uL (ref 0.0–0.1)
Basophils Absolute: 0 10*3/uL (ref 0.0–0.1)
Basophils Relative: 0 %
EOS ABS: 0.1 10*3/uL (ref 0.0–0.7)
EOS PCT: 1 %
HEMATOCRIT: 36.9 % (ref 36.0–46.0)
Hemoglobin: 11.2 g/dL — ABNORMAL LOW (ref 12.0–15.0)
Immature Granulocytes: 0 %
LYMPHS ABS: 2.6 10*3/uL (ref 0.7–4.0)
LYMPHS PCT: 43 %
MCH: 25.5 pg — AB (ref 26.0–34.0)
MCHC: 30.4 g/dL (ref 30.0–36.0)
MCV: 84.1 fL (ref 78.0–100.0)
MONO ABS: 0.5 10*3/uL (ref 0.1–1.0)
Monocytes Relative: 8 %
Neutro Abs: 2.9 10*3/uL (ref 1.7–7.7)
Neutrophils Relative %: 48 %
Platelets: 166 10*3/uL (ref 150–400)
RBC: 4.39 MIL/uL (ref 3.87–5.11)
RDW: 15.3 % (ref 11.5–15.5)
WBC: 6.1 10*3/uL (ref 4.0–10.5)

## 2018-05-02 LAB — BRAIN NATRIURETIC PEPTIDE: B Natriuretic Peptide: 337 pg/mL — ABNORMAL HIGH (ref 0.0–100.0)

## 2018-05-02 MED ORDER — FUROSEMIDE 10 MG/ML IJ SOLN
80.0000 mg | Freq: Once | INTRAMUSCULAR | Status: AC
  Administered 2018-05-03: 80 mg via INTRAVENOUS
  Filled 2018-05-02: qty 8

## 2018-05-02 NOTE — ED Triage Notes (Signed)
Pt arrives to ED from home with complaints of shortness of breath since 2000 while she was eating dinner. EMS reports pt has SOB with no CP,N/V. Pt was 90%RA, and placed on NRB. Pt noticed swelling in legs and her feet feet feeling numb. R leg feeling numb. Pt placed in position of comfort with bed locked and lowered, call bell in reach.

## 2018-05-02 NOTE — ED Provider Notes (Signed)
[facility] EMERGENCY DEPARTMENT Provider Note   CSN: [id number] Arrival date & time: 05/02/18  2052     History   Chief Complaint Chief Complaint  Patient presents with  . Shortness of Breath    HPI [name] is a 84 y.o. female.  Patient is a 84 year old female with a history of CHF, chronic kidney disease and hypertension who presents with shortness of breath.  She states that she has had some gradually worsening shortness of breath throughout this afternoon.  She states it feels similar to when she has had prior CHF exacerbations.  She takes furosemide and has not changed her dose recently.  She denies any significant weight gain.  She has some chronic lower extremity swelling which is unchanged from her baseline.  She denies any chest pain or tightness.  She denies any known fevers.  No productive cough.  No other recent illnesses other than she has a small sore on her left foot which was seen in urgent care today.  She was started on doxycycline and is supposed to see a podiatrist tomorrow.     Past Medical History:  Diagnosis Date  . Arthritis   . Chronic diastolic CHF (congestive heart failure) ([place])    a. Echo 01/02/16: Moderate concentric LVH, vigorous LV function, EF 65-70%, normal wall motion, grade 3 diastolic dysfunction, calcified aortic valve, MAC, mild MR, mild LAE  . CKD (chronic kidney disease)    Sees Dr. Moshe Cipro who took her off benicar in 2013.             Marland Kitchen Heart murmur    long time ago--no problems  . Hypercholesteremia   . Hypertensive heart and kidney disease with heart failure and with chronic kidney disease stage IV [facility])     Patient Active Problem List   Diagnosis Date Noted  . Hypertension 01/03/2018  . Atypical chest pain 01/03/2018  . Sinus bradycardia 01/03/2018  . Hypertensive heart and kidney disease with heart failure and with chronic kidney disease stage IV ([place])   . Fever, unspecified 01/06/2016  . Acute  respiratory failure with hypoxia ([place]) 01/01/2016  . Chronic diastolic heart failure ([place]) 01/01/2016  . History of DVT (deep vein thrombosis) 01/01/2016  . Chronic kidney disease, stage IV (severe) ([place]) 01/01/2016  . Anemia of renal disease 01/01/2016  . Iron deficiency anemia 01/01/2016  . Hyperkalemia 11/21/2014  . Diarrhea 11/21/2014  . Metabolic acidosis 25/95/6387  . Renal failure (ARF), acute on chronic ([place]) 11/21/2014    Past Surgical History:  Procedure Laterality Date  . CATARACT EXTRACTION W/PHACO Right 03/20/2014   Procedure: CATARACT EXTRACTION PHACO AND INTRAOCULAR LENS PLACEMENT (IOC);  Surgeon: [name], MD;  Location: [facility];  Service: Ophthalmology;  Laterality: Right;  . failed knee surgery     on the right x 2  . JOINT REPLACEMENT     both knees  . left ankle orif       OB History   None      Home Medications    Prior to Admission medications   Medication Sig Start Date End Date Taking? Authorizing Provider  allopurinol (ZYLOPRIM) 100 MG tablet Take 100 mg by mouth 2 (two) times daily. 12/24/15  Yes [provider]  ALPRAZolam (XANAX) 0.25 MG tablet Take 0.25 mg by mouth every evening.  09/15/17  Yes [provider]  amLODipine (NORVASC) 10 MG tablet Take 1 tablet (10 mg total) by mouth daily. Patient taking differently: Take 5 mg by  mouth daily.  01/02/18  Yes End, Harrell Gave, MD  aspirin EC 81 MG tablet Take 81 mg by mouth daily.   Yes [provider]  cloNIDine (CATAPRES) 0.1 MG tablet Take 0.1 mg by mouth 2 (two) times daily.    Yes [provider]  docusate sodium (COLACE) 100 MG capsule Take 100 mg by mouth daily as needed for mild constipation.    Yes [provider]  ferrous sulfate 325 (65 FE) MG tablet Take 325 mg by mouth every evening.    Yes [provider]  hydrALAZINE (APRESOLINE) 100 MG tablet Take 100 mg by mouth 3 (three) times daily.    Yes [provider]    HYDROcodone-acetaminophen (NORCO) 10-325 MG tablet Take 1 tablet by mouth every 6 (six) hours as needed for moderate pain.    Yes [provider]  levothyroxine (SYNTHROID, LEVOTHROID) 50 MCG tablet Take 50 mcg by mouth daily before breakfast.   Yes [provider]  metoprolol tartrate (LOPRESSOR) 25 MG tablet Take 25 mg by mouth 2 (two) times daily.   Yes [provider]  Multiple Vitamin (MULTIVITAMIN WITH MINERALS) TABS tablet Take 1 tablet by mouth daily.   Yes [provider]  rosuvastatin (CRESTOR) 10 MG tablet Take 10 mg by mouth every evening.   Yes [provider]  torsemide (DEMADEX) 20 MG tablet Take 2 tablets (40 mg) daily twice per day 01/30/18  Yes End, Harrell Gave, MD    Family History Family History  Problem Relation Age of Onset  . Hypertension Mother   . Stroke Father   . Diabetes Mellitus II Daughter   . Hypertension Daughter   . Heart attack Neg Hx     Social History Social History   Tobacco Use  . Smoking status: Never Smoker  . Smokeless tobacco: Never Used  Substance Use Topics  . Alcohol use: Yes    Alcohol/week: 1.0 standard drinks    Types: 1 Standard drinks or equivalent per week    Comment: occasional  . Drug use: No     Allergies   Naprosyn [naproxen]   Review of Systems Review of Systems  Constitutional: Negative for chills, diaphoresis, fatigue and fever.  HENT: Negative for congestion, rhinorrhea and sneezing.   Eyes: Negative.   Respiratory: Positive for shortness of breath. Negative for cough and chest tightness.   Cardiovascular: Negative for chest pain and leg swelling.  Gastrointestinal: Negative for abdominal pain, blood in stool, diarrhea, nausea and vomiting.  Genitourinary: Negative for difficulty urinating, flank pain, frequency and hematuria.  Musculoskeletal: Negative for arthralgias and back pain.  Skin: Positive for wound. Negative for rash.  Neurological: Negative for dizziness,  speech difficulty, weakness, numbness and headaches.     Physical Exam Updated Vital Signs BP (!) 175/53   Pulse 63   Temp 99 F (37.2 C)   Resp (!) 21   Ht _0  (1.676 m)   Wt 91.2 kg   SpO2 100%   BMI 32.44 kg/m   Physical Exam  Constitutional: She is oriented to person, place, and time. She appears well-developed and well-nourished.  HENT:  Head: Normocephalic and atraumatic.  Eyes: Pupils are equal, round, and reactive to light.  Neck: Normal range of motion. Neck supple.  Cardiovascular: Normal rate, regular rhythm and normal heart sounds.  Pulmonary/Chest: Effort normal and breath sounds normal. No respiratory distress. She has no wheezes. She has no rales. She exhibits edema. She exhibits no tenderness.  Abdominal: Soft. Bowel sounds  are normal. There is no tenderness. There is no rebound and no guarding.  Musculoskeletal: Normal range of motion. She exhibits no edema.  Patient has a 1 cm circular callus to the plantar surface of her left lateral foot.  It is tender to palpation.  There is no drainage.  No significant surrounding erythema.  She has pedal pulses by Doppler.  No change in color or warmth as compared to the other foot.  Lymphadenopathy:    She has no cervical adenopathy.  Neurological: She is alert and oriented to person, place, and time.  Skin: Skin is warm and dry. No rash noted.  Psychiatric: She has a normal mood and affect.     ED Treatments / Results  Labs (all labs ordered are listed, but only abnormal results are displayed) Labs Reviewed  COMPREHENSIVE METABOLIC PANEL - Abnormal; Notable for the following components:      Result Value   Glucose, Bld 101 (*)    BUN 73 (*)    Creatinine, Ser 2.63 (*)    GFR calc non Af Amer 15 (*)    GFR calc Af Amer 18 (*)    All other components within normal limits  CBC WITH DIFFERENTIAL/PLATELET - Abnormal; Notable for the following components:   Hemoglobin 11.2 (*)    MCH 25.5 (*)    All other  components within normal limits  BRAIN NATRIURETIC PEPTIDE - Abnormal; Notable for the following components:   B Natriuretic Peptide 337.0 (*)    All other components within normal limits  I-STAT TROPONIN, ED    EKG EKG Interpretation  Date/Time:  Tuesday May 02 2018 20:59:50 EDT Ventricular Rate:  66 PR Interval:    QRS Duration: 104 QT Interval:  467 QTC Calculation: 490 R Axis:   81 Text Interpretation:  Sinus rhythm Borderline right axis deviation RSR' in V1 or V2, probably normal variant Borderline ST elevation, anterior leads Borderline prolonged QT interval Artifact in lead(s) I II aVR aVL aVF since last tracing no significant change Confirmed by Malvin Johns [phone]) on 05/02/2018 9:22:15 PM   Radiology Dg Chest 2 View  Result Date: 05/02/2018 CLINICAL DATA:  Shortness of breath EXAM: CHEST - 2 VIEW COMPARISON:  Chest radiograph 01/17/2017 FINDINGS: The lungs are well inflated. There is moderate cardiomegaly. There is no focal airspace consolidation or pulmonary edema. There is no pleural effusion or pneumothorax. IMPRESSION: Cardiomegaly without pulmonary edema. Electronically Signed   By: Ulyses Jarred M.D.   On: 05/02/2018 22:14    Procedures Procedures (including critical care time)  Medications Ordered in ED Medications  furosemide (LASIX) injection 80 mg (has no administration in time range)     Initial Impression / Assessment and Plan / ED Course  I have reviewed the triage vital signs and the nursing notes.  Pertinent labs & imaging results that were available during my care of the patient were reviewed by me and considered in my medical decision making (see chart for details).     Patient is a 84 year old female who presents with shortness of breath.  She had an initial oxygen saturation of 90% on room air.  Currently she is satting 100% on room air.  Her lungs are clear.  Her chest x-ray does not show any evidence of vascular congestion or pulmonary  edema.  Her BNP is mildly elevated.  Her troponin is negative.  She has no suggestions of pneumonia.  She was given a dose of Lasix in the ED.  The plan is  to discharge her if she is feeling better and able to ambulate without shortness of breath.  Dr. Dina Rich to take over care pending lasix and re-eval.  Final Clinical Impressions(s) / ED Diagnoses   Final diagnoses:  Acute on chronic congestive heart failure, unspecified heart failure type [facility])    ED Discharge Orders    None       Malvin Johns, MD 05/02/18 [id number]

## 2018-05-02 NOTE — ED Notes (Signed)
Patient transported to X-ray 

## 2018-05-03 ENCOUNTER — Telehealth: Payer: Self-pay | Admitting: Internal Medicine

## 2018-05-03 NOTE — ED Notes (Signed)
Patient verbalizes understanding of discharge instructions. Opportunity for questioning and answers were provided. Armband removed by staff, pt discharged from ED in wheelchair.  

## 2018-05-03 NOTE — Telephone Encounter (Signed)
Spoke to patient's daughter [name]) in regards to patient's admission to the hospital on the evening of 9/3.The patient had trouble breathing and had numbness in her legs/feet.  After numerous tests, the patient was discharged home.  She is much better today. The patient has a hx of blood clots and developed an ulcer on the bottom of her foot.  They have an appt with a podiatrist today 9/4.  The patient will call if needing further evaluation.

## 2018-05-03 NOTE — ED Provider Notes (Signed)
Per nursing, patient does not want to ambulate and daughters feel like she is tired.  However, overall she feels much better and feels less short of breath.  Pulse ox 97%.  Vital signs reviewed and largely reassuring.  Will discharge home with close follow-up per Dr. Mollie Germany d/c instructions.   Merryl Hacker, MD 05/03/18 [phone]

## 2018-05-03 NOTE — ED Notes (Signed)
purwick was unable to catch the pts urine. She saturated linens. Purwick replaced and working correctly

## 2018-05-03 NOTE — ED Notes (Addendum)
Went into pts room to ambulate, family spoke for pt stating that she would not not be able to ambulate without her cane which she does not have with her. RN notified.

## 2018-05-03 NOTE — Telephone Encounter (Signed)
New Message:     Patient daughter called because her mother went to the hospital last night and she has additional questions

## 2018-05-04 NOTE — Telephone Encounter (Signed)
Please have patient f/u with next available APP appointment.  Nelva Bush, MD [facility] Pager: [phone]

## 2018-05-04 NOTE — Telephone Encounter (Signed)
Spoke to patient's daughter [name]) and arranged an appt with Nicki Reaper on 9/11.  She verbalized understanding.

## 2018-05-10 ENCOUNTER — Ambulatory Visit: Payer: Medicare HMO | Admitting: Physician Assistant

## 2018-05-10 ENCOUNTER — Encounter: Payer: Self-pay | Admitting: Physician Assistant

## 2018-05-10 VITALS — BP 154/42 | HR 46 | Ht 66.0 in | Wt 201.2 lb

## 2018-05-10 DIAGNOSIS — I130 Hypertensive heart and chronic kidney disease with heart failure and stage 1 through stage 4 chronic kidney disease, or unspecified chronic kidney disease: Secondary | ICD-10-CM | POA: Diagnosis not present

## 2018-05-10 DIAGNOSIS — N184 Chronic kidney disease, stage 4 (severe): Secondary | ICD-10-CM

## 2018-05-10 DIAGNOSIS — R001 Bradycardia, unspecified: Secondary | ICD-10-CM

## 2018-05-10 DIAGNOSIS — I5032 Chronic diastolic (congestive) heart failure: Secondary | ICD-10-CM | POA: Diagnosis not present

## 2018-05-10 DIAGNOSIS — R0602 Shortness of breath: Secondary | ICD-10-CM

## 2018-05-10 MED ORDER — ISOSORBIDE MONONITRATE ER 30 MG PO TB24: 30.0000 mg | ORAL_TABLET | Freq: Every day | ORAL | 11 refills | Status: DC

## 2018-05-10 NOTE — Progress Notes (Signed)
Cardiology Office Note:    Date:  05/10/2018   ID:  [name], DOB March 11, 1929, MRN [id number]  PCP:  Antonietta Jewel, MD  Cardiologist:  Nelva Bush, MD  Electrophysiologist:  None  Nephrologist:  Dr. Moshe Cipro  Referring MD: Antonietta Jewel, MD   Chief Complaint  Patient presents with  . Hospitalization Follow-up    ED visit with dyspnea    History of Present Illness:    [name] is a 84 y.o. female with diastolic heart failure, chronic kidney disease, prior DVT, hypertension, hyperlipidemia.  She was last seen in clinic in June 2019.  Her diuretics were adjusted for volume excess.  She was recently seen in the emergency room 05/02/2018 with increased shortness of breath.  Her BNP was minimally elevated.  Chest x-ray demonstrated no edema.  She was given 1 dose of IV Lasix with improved symptoms.    [name] returns for follow-up after recent emergency room visit.  She is here today with her daughters.  Since she went to the emergency room, her breathing has improved.  She still sleeps on 2 pillows.  She has not had paroxysmal nocturnal dyspnea.  She has not had chest pain or syncope.  She does have a nonproductive cough.  Her lower extremity swelling is improved.  Prior CV studies:   The following studies were reviewed today:  Echocardiogram 09/21/2017 Moderate LVH, EF 60-65, normal wall motion, grade 2 diastolic dysfunction, mild to moderate MAC, mildly dilated RV, mildly reduced RVSF, mild RAE, PASP 73  Echocardiogram 01/02/2016 Moderate concentric LVH, EF 65-70, normal wall motion grade 3 diastolic dysfunction, calcified aortic valve MAC, mild MR  LE Angiogram 11/2000 FINAL IMPRESSION: 1. Mild bilateral renal artery stenosis. 2. Left superficial femoral artery occlusion. 3. 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  Nuclear stress test 09/2000 IMPRESSION 1.  NORMAL PERFUSION.  NO ISCHEMIA. 2.  EJECTION FRACTION IS 71%.  Past Medical History:    Diagnosis Date  . Arthritis   . Chronic diastolic CHF (congestive heart failure) ([place])    a. Echo 01/02/16: Moderate concentric LVH, vigorous LV function, EF 65-70%, normal wall motion, grade 3 diastolic dysfunction, calcified aortic valve, MAC, mild MR, mild LAE  . CKD (chronic kidney disease)    Sees Dr. Moshe Cipro who took her off benicar in 2013.             Marland Kitchen Heart murmur    long time ago--no problems  . Hypercholesteremia   . Hypertensive heart and kidney disease with heart failure and with chronic kidney disease stage IV [facility])    Surgical Hx: The patient  has a past surgical history that includes Joint replacement; failed knee surgery; Cataract extraction w/PHACO (Right, 03/20/2014); and left ankle orif.   Current Medications: Current Meds  Medication Sig  . allopurinol (ZYLOPRIM) 100 MG tablet Take 100 mg by mouth 2 (two) times daily.  Marland Kitchen ALPRAZolam (XANAX) 0.25 MG tablet Take 0.25 mg by mouth every evening.   Marland Kitchen amLODipine (NORVASC) 10 MG tablet Take 10 mg by mouth daily. Patient taking 5 mg every day  . aspirin EC 81 MG tablet Take 81 mg by mouth daily.  . cloNIDine (CATAPRES) 0.1 MG tablet Take 0.1 mg by mouth 2 (two) times daily.   Marland Kitchen docusate sodium (COLACE) 100 MG capsule Take 100 mg by mouth daily as needed for mild constipation.   . ferrous sulfate 325 (65 FE) MG tablet Take 325 mg by mouth every evening.   . hydrALAZINE (APRESOLINE)  100 MG tablet Take 100 mg by mouth 3 (three) times daily.   Marland Kitchen HYDROcodone-acetaminophen (NORCO) 10-325 MG tablet Take 1 tablet by mouth every 6 (six) hours as needed for moderate pain.   Marland Kitchen LACTULOSE PO Take by mouth as directed. liquid  . levothyroxine (SYNTHROID, LEVOTHROID) 50 MCG tablet Take 50 mcg by mouth daily before breakfast.  . Multiple Vitamin (MULTIVITAMIN WITH MINERALS) TABS tablet Take 1 tablet by mouth daily.  . rosuvastatin (CRESTOR) 10 MG tablet Take 10 mg by mouth every evening.  . torsemide (DEMADEX) 20 MG tablet Take 2  tablets (40 mg) daily twice per day  . [DISCONTINUED] metoprolol tartrate (LOPRESSOR) 25 MG tablet Take 25 mg by mouth 2 (two) times daily.     Allergies:   Naprosyn [naproxen]   Social History   Tobacco Use  . Smoking status: Never Smoker  . Smokeless tobacco: Never Used  Substance Use Topics  . Alcohol use: Yes    Alcohol/week: 1.0 standard drinks    Types: 1 Standard drinks or equivalent per week    Comment: occasional  . Drug use: No     Family Hx: The patient's family history includes Diabetes Mellitus II in her daughter; Hypertension in her daughter and mother; Stroke in her father. There is no history of Heart attack.  ROS:   Please see the history of present illness.    Review of Systems  Cardiovascular: Positive for dyspnea on exertion.  Respiratory: Positive for shortness of breath.    All other systems reviewed and are negative.   EKGs/Labs/Other Test Reviewed:    EKG:  EKG is  ordered today.  The ekg ordered today demonstrates sinus brady, HR 43, normal axis, QTc 420   Recent Labs: 09/29/2017: NT-Pro BNP 1,536 01/30/2018: TSH 3.980 05/02/2018: ALT 13; B Natriuretic Peptide 337.0; BUN 73; Creatinine, Ser 2.63; Hemoglobin 11.2; Platelets 166; Potassium 4.1; Sodium 140   Recent Lipid Panel No results found for: CHOL, TRIG, HDL, CHOLHDL, LDLCALC, LDLDIRECT  Physical Exam:    VS:  BP (!) 154/42   Pulse (!) 46   Ht _0  (1.676 m)   Wt 201 lb 4 oz (91.3 kg)   SpO2 98%   BMI 32.48 kg/m     Wt Readings from Last 3 Encounters:  05/10/18 201 lb 4 oz (91.3 kg)  05/02/18 201 lb (91.2 kg)  01/30/18 202 lb (91.6 kg)     Physical Exam  Constitutional: She is oriented to person, place, and time. She appears well-developed and well-nourished. No distress.  HENT:  Head: Normocephalic and atraumatic.  Eyes: No scleral icterus.  Neck: No JVD present. No thyromegaly present.  Cardiovascular: Normal rate and regular rhythm.  No murmur heard. Pulmonary/Chest: Effort  normal. She has no wheezes. She has no rales.  Abdominal: Soft. She exhibits no distension.  Musculoskeletal: She exhibits edema (trace bilat LE edema).  Lymphadenopathy:    She has no cervical adenopathy.  Neurological: She is alert and oriented to person, place, and time.  Skin: Skin is warm and dry.  Psychiatric: She has a normal mood and affect.    ASSESSMENT & PLAN:    Shortness of breath  She was recently seen in the emergency room for increased shortness of breath.  Her BNP was minimally elevated and she had no edema on chest x-ray.  However, she did have good response to IV Lasix.  However, she does still feel short of breath with exertion.  Her heart rate is quite low.  She is also at risk for coronary artery disease.  She is not a candidate for cardiac catheterization given her chronic kidney disease, unless it is an emergency.  Options for blood pressure control are limited.  I have recommended that we take her off of her beta-blocker and start her on long-acting nitrates.  If her symptoms continue to worsen over time, we will need to consider whether or not to proceed with stress testing.  -Decrease metoprolol to once daily x2 days, then stop  -Continue current dose of diuretic  -Start isosorbide 30 mg daily  Bradycardia Question if she is symptomatic.  I wonder if this may be contributing to some of her symptoms.  Taper off of metoprolol as noted.  If she remains bradycardic at follow-up, consider Holter monitoring.  Chronic diastolic heart failure (HCC) Overall, volume appears stable.  Continue current dose of diuretic.  Hypertensive heart and kidney disease with heart failure and with chronic kidney disease stage IV (HCC) Blood pressure above target.  She is on max dose amlodipine and hydralazine.  Given her bradycardia I would not increase her clonidine further.  As noted, I am taking her off of metoprolol.  Therefore, I will place her on isosorbide as noted  above.   Dispo:  Return in about 6 weeks (around 06/21/2018) for Routine Follow Up, w/ Richardson Dopp, PA-C.   Medication Adjustments/Labs and Tests Ordered: Current medicines are reviewed at length with the patient today.  Concerns regarding medicines are outlined above.  Tests Ordered: Orders Placed This Encounter  Procedures  . EKG 12-Lead   Medication Changes: Meds ordered this encounter  Medications  . isosorbide mononitrate (IMDUR) 30 MG 24 hr tablet    Sig: Take 1 tablet (30 mg total) by mouth daily.    Dispense:  30 tablet    Refill:  [address], Richardson Dopp, [place]  05/10/2018 5:47 PM    [facility] [place], [place], [place]  [address] Phone: [phone]; Fax: [phone]

## 2018-05-10 NOTE — Patient Instructions (Addendum)
Medication Instructions:  1. DECREASE METOPROLOL TO 1/2 TABLET ONCE A DAY FOR 2 DAYS THEN STOP  2. START IMDUR 30 MG DAILY; RX HAS BEEN SENT IN  Labwork: NONE ORDERED TODAY  Testing/Procedures: NONE ORDERED TODAY  Follow-Up: Richardson Dopp, [facility] 06/21/18 @ 2:45 PM   Any Other Special Instructions Will Be Listed Below (If Applicable).     If you need a refill on your cardiac medications before your next appointment, please call your pharmacy.

## 2018-06-21 ENCOUNTER — Ambulatory Visit (INDEPENDENT_AMBULATORY_CARE_PROVIDER_SITE_OTHER): Payer: Medicare HMO | Admitting: Physician Assistant

## 2018-06-21 ENCOUNTER — Encounter: Payer: Self-pay | Admitting: Physician Assistant

## 2018-06-21 VITALS — BP 150/52 | HR 76 | Ht 66.0 in | Wt 207.1 lb

## 2018-06-21 DIAGNOSIS — R001 Bradycardia, unspecified: Secondary | ICD-10-CM | POA: Diagnosis not present

## 2018-06-21 DIAGNOSIS — I130 Hypertensive heart and chronic kidney disease with heart failure and stage 1 through stage 4 chronic kidney disease, or unspecified chronic kidney disease: Secondary | ICD-10-CM | POA: Diagnosis not present

## 2018-06-21 DIAGNOSIS — N184 Chronic kidney disease, stage 4 (severe): Secondary | ICD-10-CM | POA: Diagnosis not present

## 2018-06-21 DIAGNOSIS — I5032 Chronic diastolic (congestive) heart failure: Secondary | ICD-10-CM | POA: Diagnosis not present

## 2018-06-21 MED ORDER — ISOSORBIDE MONONITRATE ER 60 MG PO TB24: 90.0000 mg | ORAL_TABLET | Freq: Every day | ORAL | 3 refills | Status: DC

## 2018-06-21 NOTE — Patient Instructions (Addendum)
Medication Instructions:  1. INCREASE IMDUR TO 90 MG DAILY; THIS WILL BE 1 AND 1/2 TABS DAILY OF THE 60 MG TABLET; NEW RX HAS BEEN SENT IN If you need a refill on your cardiac medications before your next appointment, please call your pharmacy.   Lab work: NONE ORDERED TODAY If you have labs (blood work) drawn today and your tests are completely normal, you will receive your results only by: Marland Kitchen MyChart Message (if you have MyChart) OR . A paper copy in the mail If you have any lab test that is abnormal or we need to change your treatment, we will call you to review the results.  Testing/Procedures: NONE ORDERED TODAY  Follow-Up: At [facility], you and your health needs are our priority.  As part of our continuing mission to provide you with exceptional heart care, we have created designated Provider Care Teams.  These Care Teams include your primary Cardiologist (physician) and Advanced Practice Providers (APPs -  Physician Assistants and Nurse Practitioners) who all work together to provide you with the care you need, when you need it. You will need a follow up appointment in:  6 months.  Please call our office 2 months in advance to schedule this appointment.  You may see Richardson Dopp, PAC one of the following Advanced Practice Providers on your designated Care Team: Richardson Dopp, PA-C [place], [place] . Daune Perch, NP  Any Other Special Instructions Will Be Listed Below (If Applicable).

## 2018-06-21 NOTE — Progress Notes (Signed)
Cardiology Office Note:    Date:  06/21/2018   ID:  [name], DOB 06-06-1929, MRN [id number]  PCP:  Antonietta Jewel, MD  Cardiologist:  Mertie Moores, MD / Richardson Dopp, PA-C  Electrophysiologist:  None  Nephrologist:  Dr. Moshe Cipro  Referring MD: Antonietta Jewel, MD   Chief Complaint  Patient presents with  . Follow-up    HTN, shortness of breath    History of Present Illness:    [name] is a 84 y.o. female with diastolic heart failure, chronic kidney disease, prior DVT, hypertension, hyperlipidemia.  She was last seen 05/10/2018 after a visit to the emergency room with volume excess.  She continued to have issues with shortness of breath.  Her heart rate was in the 40s.  I stopped her beta-blocker and placed her on isosorbide.  [name] returns for follow-up.  She is here with her daughter and her daughter's friend.  Since last seen, she has noted improved symptoms.  She has more energy and has had less shortness of breath.  She denies chest pain, paroxysmal nocturnal dyspnea.  She does have mild pedal edema without significant change.  She does note nocturnal leg cramps.  She denies any exertional leg pain.  Her nephrologist recently increase her isosorbide to 60 mg daily.  Prior CV studies:   The following studies were reviewed today:  Echocardiogram 09/21/2017 Moderate LVH, EF 60-65, normal wall motion, grade 2 diastolic dysfunction, mild to moderate MAC, mildly dilated RV, mildly reduced RVSF, mild RAE, PASP 73  Echocardiogram 01/02/2016 Moderate concentric LVH, EF 65-70, normal wall motion grade 3 diastolic dysfunction, calcified aortic valve MAC, mild MR  LE Angiogram 11/2000 FINAL IMPRESSION: 1. Mild bilateral renal artery stenosis. 2. Left superficial femoral artery occlusion. 3. 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  Nuclear stress test 09/2000 IMPRESSION 1. NORMAL PERFUSION. NO ISCHEMIA. 2. EJECTION FRACTION IS 71%.  Past  Medical History:  Diagnosis Date  . Arthritis   . Chronic diastolic CHF (congestive heart failure) ([place])    a. Echo 01/02/16: Moderate concentric LVH, vigorous LV function, EF 65-70%, normal wall motion, grade 3 diastolic dysfunction, calcified aortic valve, MAC, mild MR, mild LAE  . CKD (chronic kidney disease)    Sees Dr. Moshe Cipro who took her off benicar in 2013.             Marland Kitchen Heart murmur    long time ago--no problems  . Hypercholesteremia   . Hypertensive heart and kidney disease with heart failure and with chronic kidney disease stage IV [facility])    Surgical Hx: The patient  has a past surgical history that includes Joint replacement; failed knee surgery; Cataract extraction w/PHACO (Right, 03/20/2014); and left ankle orif.   Current Medications: Current Meds  Medication Sig  . allopurinol (ZYLOPRIM) 100 MG tablet Take 100 mg by mouth 2 (two) times daily.  Marland Kitchen ALPRAZolam (XANAX) 0.25 MG tablet Take 0.25 mg by mouth every evening.   Marland Kitchen amLODipine (NORVASC) 10 MG tablet Take 10 mg by mouth daily. Patient taking 5 mg every day  . aspirin EC 81 MG tablet Take 81 mg by mouth daily.  . cloNIDine (CATAPRES) 0.1 MG tablet Take 0.1 mg by mouth 2 (two) times daily.   Marland Kitchen docusate sodium (COLACE) 100 MG capsule Take 100 mg by mouth daily as needed for mild constipation.   . ferrous sulfate 325 (65 FE) MG tablet Take 325 mg by mouth every evening.   . hydrALAZINE (APRESOLINE) 100 MG tablet Take  100 mg by mouth 3 (three) times daily.   Marland Kitchen HYDROcodone-acetaminophen (NORCO) 10-325 MG tablet Take 1 tablet by mouth every 6 (six) hours as needed for moderate pain.   Marland Kitchen LACTULOSE PO Take by mouth as directed. liquid  . levothyroxine (SYNTHROID, LEVOTHROID) 75 MCG tablet Take 75 mcg by mouth daily.  . Multiple Vitamin (MULTIVITAMIN WITH MINERALS) TABS tablet Take 1 tablet by mouth daily.  . rosuvastatin (CRESTOR) 10 MG tablet Take 10 mg by mouth every evening.  . torsemide (DEMADEX) 20 MG tablet Take 2  tablets (40 mg) daily twice per day  . [DISCONTINUED] isosorbide mononitrate (IMDUR) 60 MG 24 hr tablet Take 60 mg by mouth daily.  . [DISCONTINUED] levothyroxine (SYNTHROID, LEVOTHROID) 50 MCG tablet Take 50 mcg by mouth daily before breakfast.     Allergies:   Naprosyn [naproxen]   Social History   Tobacco Use  . Smoking status: Never Smoker  . Smokeless tobacco: Never Used  Substance Use Topics  . Alcohol use: Yes    Alcohol/week: 1.0 standard drinks    Types: 1 Standard drinks or equivalent per week    Comment: occasional  . Drug use: No     Family Hx: The patient's family history includes Diabetes Mellitus II in her daughter; Hypertension in her daughter and mother; Stroke in her father. There is no history of Heart attack.  ROS:   Please see the history of present illness.    ROS All other systems reviewed and are negative.   EKGs/Labs/Other Test Reviewed:    EKG:  EKG is  ordered today.  The ekg ordered today demonstrates normal sinus rhythm, heart rate 70, normal axis, QTC 453  Recent Labs: 09/29/2017: NT-Pro BNP 1,536 01/30/2018: TSH 3.980 05/02/2018: ALT 13; B Natriuretic Peptide 337.0; BUN 73; Creatinine, Ser 2.63; Hemoglobin 11.2; Platelets 166; Potassium 4.1; Sodium 140   Recent Lipid Panel No results found for: CHOL, TRIG, HDL, CHOLHDL, LDLCALC, LDLDIRECT   Labs from nephrology 06/15/2018 Creatinine 2.06, BUN 45, potassium 4.3  Physical Exam:    VS:  BP (!) 150/52   Pulse 76   Ht _0  (1.676 m)   Wt 207 lb 1.9 oz (93.9 kg)   SpO2 98%   BMI 33.43 kg/m     Wt Readings from Last 3 Encounters:  06/21/18 207 lb 1.9 oz (93.9 kg)  05/10/18 201 lb 4 oz (91.3 kg)  05/02/18 201 lb (91.2 kg)     Physical Exam  Constitutional: She is oriented to person, place, and time. She appears well-developed and well-nourished. No distress.  HENT:  Head: Normocephalic and atraumatic.  Eyes: No scleral icterus.  Neck: No JVD present.  Cardiovascular: Normal rate and  regular rhythm.  Murmur heard.  Systolic murmur is present with a grade of 1/6 at the upper right sternal border. Pulmonary/Chest: Effort normal. She has no rales.  Abdominal: Soft.  Musculoskeletal: She exhibits no edema.  Neurological: She is alert and oriented to person, place, and time.  Skin: Skin is warm and dry.    ASSESSMENT & PLAN:    Chronic diastolic heart failure (HCC) EF 60-65 with grade 2 diastolic dysfunction by echo 1/19.  NYHA 2.  Volume status appears stable.  Recent creatinine improved.  Continue current therapy.  Given Dr. Darnelle Bos transition to [facility], I will follow her here going forward.  I will assign her to Dr. Acie Fredrickson on my Care Team.    Sinus bradycardia Heart rate improved.  Remain off of beta-blocker.  Hypertensive  heart and kidney disease with heart failure and with chronic kidney disease stage IV (HCC) Blood pressure remains above target.  Increase isosorbide mononitrate to 90 mg daily.   Dispo:  Return in about 6 months (around 12/21/2018) for Routine Follow Up, w/ Richardson Dopp, PA-C.   Medication Adjustments/Labs and Tests Ordered: Current medicines are reviewed at length with the patient today.  Concerns regarding medicines are outlined above.  Tests Ordered: Orders Placed This Encounter  Procedures  . EKG 12-Lead   Medication Changes: Meds ordered this encounter  Medications  . isosorbide mononitrate (IMDUR) 60 MG 24 hr tablet    Sig: Take 1.5 tablets (90 mg total) by mouth daily.    Dispense:  135 tablet    Refill:  3    DOSE INCREASE    Signed, Richardson Dopp, PA-C  06/21/2018 3:21 PM    [facility] [place], [place], [place]  [address] Phone: [phone]; Fax: [phone]

## 2018-12-13 ENCOUNTER — Telehealth: Payer: Self-pay | Admitting: *Deleted

## 2018-12-13 NOTE — Telephone Encounter (Signed)
      TELEPHONE CALL NOTE  [name] has been deemed a candidate for a follow-up tele-health visit to limit community exposure during the Covid-19 pandemic. I spoke with the patient via phone to ensure availability of phone/video source, confirm preferred email & phone number, and discuss instructions and expectations.  I reminded [name] to be prepared with any vital sign and/or heart rhythm information that could potentially be obtained via home monitoring, at the time of her visit. I reminded [name] to expect a phone call at the time of her visit if her visit.  Claude Manges, [place] 12/13/2018 12:37 PM   YES PT DTR CONSENTED

## 2018-12-18 NOTE — Progress Notes (Signed)
Virtual Visit via Video Note   This visit type was conducted due to national recommendations for restrictions regarding the COVID-19 Pandemic (e.g. social distancing) in an effort to limit this patient's exposure and mitigate transmission in our community.  Due to her co-morbid illnesses, this patient is at least at moderate risk for complications without adequate follow up.  This format is felt to be most appropriate for this patient at this time.  All issues noted in this document were discussed and addressed.  A limited physical exam was performed with this format.  Please refer to the patient's chart for her consent to telehealth for [facility].   Evaluation Performed:  Follow-up visit  Date:  12/19/2018   ID:  [name], DOB 28-Jul-1929, MRN [id number]  Patient Location: Home Provider Location: Home  PCP:  Antonietta Jewel, MD  Cardiologist:  Mertie Moores, MD / Richardson Dopp, PA-C  Electrophysiologist:  None  Nephrologist: Dr. Moshe Cipro  Chief Complaint:  FU on CHF, HTN  History of Present Illness:    [name] is a 85 y.o. female with diastolic heart failure, chronic kidney disease, prior DVT, hypertension, hyperlipidemia.  She was taken off of beta-blocker therapy in the past due to bradycardia.  She was last seen in 05/2018.    Today, she notes that she is doing well.  Her breathing is about the same.  She has not had chest discomfort, syncope, paroxysmal nocturnal dyspnea.  She has not had significant lower extremity swelling.  The patient does not have symptoms concerning for COVID-19 infection (fever, chills, cough, or new shortness of breath).    Past Medical History:  Diagnosis Date  . Arthritis   . Chronic diastolic CHF (congestive heart failure) ([place])    a. Echo 01/02/16: Moderate concentric LVH, vigorous LV function, EF 65-70%, normal wall motion, grade 3 diastolic dysfunction, calcified aortic valve, MAC, mild MR, mild LAE  . CKD (chronic kidney  disease)    Sees Dr. Moshe Cipro who took her off benicar in 2013.             Marland Kitchen Heart murmur    long time ago--no problems  . Hypercholesteremia   . Hypertensive heart and kidney disease with heart failure and with chronic kidney disease stage IV [facility])    Past Surgical History:  Procedure Laterality Date  . CATARACT EXTRACTION W/PHACO Right 03/20/2014   Procedure: CATARACT EXTRACTION PHACO AND INTRAOCULAR LENS PLACEMENT (IOC);  Surgeon: [name], MD;  Location: [facility];  Service: Ophthalmology;  Laterality: Right;  . failed knee surgery     on the right x 2  . JOINT REPLACEMENT     both knees  . left ankle orif       Current Meds  Medication Sig  . allopurinol (ZYLOPRIM) 100 MG tablet Take 100 mg by mouth 2 (two) times daily.  Marland Kitchen amLODipine (NORVASC) 10 MG tablet Take 10 mg by mouth daily.   Marland Kitchen aspirin EC 81 MG tablet Take 81 mg by mouth daily.  . cloNIDine (CATAPRES) 0.1 MG tablet Take 0.1 mg by mouth 2 (two) times daily.   Marland Kitchen ENULOSE 10 GM/15ML SOLN Place 15 mLs rectally daily.  . ferrous sulfate 325 (65 FE) MG tablet Take 325 mg by mouth every evening.   . hydrALAZINE (APRESOLINE) 100 MG tablet Take 100 mg by mouth 3 (three) times daily.   Marland Kitchen HYDROcodone-acetaminophen (NORCO) 10-325 MG tablet Take 1 tablet by mouth every 6 (six) hours as needed for moderate pain.   Marland Kitchen  levothyroxine (SYNTHROID, LEVOTHROID) 75 MCG tablet Take 75 mcg by mouth daily.  . Multiple Vitamin (MULTIVITAMIN WITH MINERALS) TABS tablet Take 1 tablet by mouth daily.  . rosuvastatin (CRESTOR) 10 MG tablet Take 10 mg by mouth every evening.  . torsemide (DEMADEX) 20 MG tablet Take 2 tablets (40 mg) daily twice per day  . traZODone (DESYREL) 100 MG tablet TK 1 T PO QHS PRN.     Allergies:   Naprosyn [naproxen]   Social History   Tobacco Use  . Smoking status: Never Smoker  . Smokeless tobacco: Never Used  Substance Use Topics  . Alcohol use: Yes    Alcohol/week: 1.0 standard drinks    Types: 1 Standard  drinks or equivalent per week    Comment: occasional  . Drug use: No     Family Hx: The patient's family history includes Diabetes Mellitus II in her daughter; Hypertension in her daughter and mother; Stroke in her father. There is no history of Heart attack.  ROS:   Please see the history of present illness.    All other systems reviewed and are negative.   Prior CV studies:   The following studies were reviewed today:  Echocardiogram 09/21/2017 Moderate LVH, EF 60-65, normal wall motion, grade 2 diastolic dysfunction, mild to moderate MAC, mildly dilated RV, mildly reduced RVSF, mild RAE, PASP 73   Echocardiogram 01/02/2016 Moderate concentric LVH, EF 65-70, normal wall motion grade 3 diastolic dysfunction, calcified aortic valve MAC, mild MR   LE Angiogram 11/2000 FINAL IMPRESSION: 1. Mild bilateral renal artery stenosis. 2. Left superficial femoral artery occlusion. 3. 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   Nuclear stress test 09/2000 IMPRESSION 1.  NORMAL PERFUSION.  NO ISCHEMIA. 2.  EJECTION FRACTION IS 71%.  Labs/Other Tests and Data Reviewed:    EKG:  No ECG reviewed.  Recent Labs: 01/30/2018: TSH 3.980 05/02/2018: ALT 13; B Natriuretic Peptide 337.0; BUN 73; Creatinine, Ser 2.63; Hemoglobin 11.2; Platelets 166; Potassium 4.1; Sodium 140   Recent Lipid Panel No results found for: CHOL, TRIG, HDL, CHOLHDL, LDLCALC, LDLDIRECT   From [facility]     Wt Readings from Last 3 Encounters:  12/19/18 210 lb (95.3 kg)  06/21/18 207 lb 1.9 oz (93.9 kg)  05/10/18 201 lb 4 oz (91.3 kg)     Objective:    Vital Signs:  BP (!) 172/48   Pulse 64   Ht 5' 5.5" (1.664 m)   Wt 210 lb (95.3 kg)   BMI 34.41 kg/m    VITAL SIGNS:  reviewed GEN:  no acute distress RESPIRATORY:  normal respiratory effort NEURO:  alert and oriented x 3, no obvious focal deficit PSYCH:  normal affect  ASSESSMENT & PLAN:    Chronic diastolic heart failure (HCC) EF 60-65 with  moderate diastolic dysfunction by echo in January 2019.  NYHA 2.  Volume status seems to be fairly stable.  Continue current medical management.  Hypertensive heart and kidney disease with heart failure and with chronic kidney disease stage IV (HCC) Recent creatinine improved/stable.  Blood pressure elevated today.  Her blood pressure readings at home continue to be above target.  Continue current dose of amlodipine, clonidine, hydralazine.  Increase isosorbide to 120 mg daily.  Monitor blood pressure over several weeks and send readings.  If blood pressure remains above target, consider increasing clonidine.  COVID-19 Education: The signs and symptoms of COVID-19 were discussed with the patient and how to seek care for testing (follow up with PCP  or arrange E-visit).  The importance of social distancing was discussed today.  Time:   Today, I have spent 11 minutes with the patient with telehealth technology discussing the above problems.     Medication Adjustments/Labs and Tests Ordered: Current medicines are reviewed at length with the patient today.  Concerns regarding medicines are outlined above.   Tests Ordered: No orders of the defined types were placed in this encounter.   Medication Changes: Meds ordered this encounter  Medications  . isosorbide mononitrate (IMDUR) 120 MG 24 hr tablet    Sig: Take 1 tablet (120 mg total) by mouth daily.    Dispense:  90 tablet    Refill:  3    Dose increase    Order Specific Question:   Supervising Provider    Answer:   Lelon Perla [[id number]]    Disposition:  Follow up in 6 month(s)  Signed, Richardson Dopp, PA-C  12/19/2018 2:21 PM    [facility]

## 2018-12-19 ENCOUNTER — Encounter: Payer: Self-pay | Admitting: Physician Assistant

## 2018-12-19 ENCOUNTER — Other Ambulatory Visit: Payer: Self-pay

## 2018-12-19 ENCOUNTER — Telehealth (INDEPENDENT_AMBULATORY_CARE_PROVIDER_SITE_OTHER): Payer: Medicare Other | Admitting: Physician Assistant

## 2018-12-19 VITALS — BP 172/48 | HR 64 | Ht 65.5 in | Wt 210.0 lb

## 2018-12-19 DIAGNOSIS — Z7189 Other specified counseling: Secondary | ICD-10-CM

## 2018-12-19 DIAGNOSIS — I5032 Chronic diastolic (congestive) heart failure: Secondary | ICD-10-CM | POA: Diagnosis not present

## 2018-12-19 DIAGNOSIS — I130 Hypertensive heart and chronic kidney disease with heart failure and stage 1 through stage 4 chronic kidney disease, or unspecified chronic kidney disease: Secondary | ICD-10-CM

## 2018-12-19 DIAGNOSIS — N184 Chronic kidney disease, stage 4 (severe): Secondary | ICD-10-CM

## 2018-12-19 MED ORDER — ISOSORBIDE MONONITRATE ER 120 MG PO TB24: 120.0000 mg | ORAL_TABLET | Freq: Every day | ORAL | 3 refills | Status: DC

## 2018-12-19 NOTE — Patient Instructions (Addendum)
Medication Instructions:  Increase Imdur (Isosorbide Mononitrate) to 120 mg once daily  You can take 4 of the 30 mg tablets until they run out I will send in a prescription for 120 mg tablets so you only have to take one of those a day  If you need a refill on your cardiac medications before your next appointment, please call your pharmacy.   Lab work: None   If you have labs (blood work) drawn today and your tests are completely normal, you will receive your results only by: Marland Kitchen MyChart Message (if you have MyChart) OR . A paper copy in the mail If you have any lab test that is abnormal or we need to change your treatment, we will call you to review the results.  Testing/Procedures: None   Follow-Up: At [facility], you and your health needs are our priority.  As part of our continuing mission to provide you with exceptional heart care, we have created designated Provider Care Teams.  These Care Teams include your primary Cardiologist (physician) and Advanced Practice Providers (APPs -  Physician Assistants and Nurse Practitioners) who all work together to provide you with the care you need, when you need it. Richardson Dopp, PA-C in 6 months.  Any Other Special Instructions Will Be Listed Below (If Applicable). Check BP 1-2 times a day and send readings to me for review.

## 2018-12-27 ENCOUNTER — Other Ambulatory Visit: Payer: Self-pay | Admitting: Physician Assistant

## 2018-12-27 MED ORDER — AMLODIPINE BESYLATE 10 MG PO TABS: 10.0000 mg | ORAL_TABLET | Freq: Every day | ORAL | 3 refills | Status: DC

## 2019-03-22 ENCOUNTER — Other Ambulatory Visit: Payer: Self-pay | Admitting: Physician Assistant

## 2019-03-23 NOTE — Telephone Encounter (Signed)
Outpatient Medication Detail   Disp Refills Start End   isosorbide mononitrate (IMDUR) 120 MG 24 hr tablet 90 tablet 3 12/19/2018 12/19/2019   Sig - Route: Take 1 tablet (120 mg total) by mouth daily. - Oral   Sent to pharmacy as: isosorbide mononitrate (IMDUR) 120 MG 24 hr tablet   Notes to Pharmacy: Dose increase   E-Prescribing Status: Receipt confirmed by pharmacy (12/19/2018 2:21 PM EDT)   [place], [place]

## 2019-06-20 ENCOUNTER — Ambulatory Visit: Payer: Medicare Other | Admitting: Physician Assistant

## 2019-06-20 ENCOUNTER — Encounter: Payer: Self-pay | Admitting: Physician Assistant

## 2019-06-20 ENCOUNTER — Other Ambulatory Visit: Payer: Self-pay

## 2019-06-20 VITALS — BP 150/54 | HR 63 | Ht 65.0 in | Wt 218.0 lb

## 2019-06-20 DIAGNOSIS — R0989 Other specified symptoms and signs involving the circulatory and respiratory systems: Secondary | ICD-10-CM

## 2019-06-20 DIAGNOSIS — I5032 Chronic diastolic (congestive) heart failure: Secondary | ICD-10-CM | POA: Diagnosis not present

## 2019-06-20 DIAGNOSIS — M25562 Pain in left knee: Secondary | ICD-10-CM

## 2019-06-20 DIAGNOSIS — N184 Chronic kidney disease, stage 4 (severe): Secondary | ICD-10-CM | POA: Diagnosis not present

## 2019-06-20 DIAGNOSIS — G8929 Other chronic pain: Secondary | ICD-10-CM

## 2019-06-20 DIAGNOSIS — I130 Hypertensive heart and chronic kidney disease with heart failure and stage 1 through stage 4 chronic kidney disease, or unspecified chronic kidney disease: Secondary | ICD-10-CM

## 2019-06-20 NOTE — Progress Notes (Addendum)
Cardiology Office Note:    Date:  06/20/2019   ID:  [name], DOB March 14, 1929, MRN [id number]  PCP:  Antonietta Jewel, MD  Cardiologist:  Mertie Moores, MD  Electrophysiologist:  None  Nephrologist:  Dr. Moshe Cipro  Referring MD: Antonietta Jewel, MD   Chief Complaint  Patient presents with  . Follow-up    CHF, HTN    History of Present Illness:    [name] is a 85 y.o. female with:   Chronic diastolic CHF  Echocardiogram 08/2017: EF 60-65, Gr 2 DD, PASP 73, mild reduced RVSF  Chronic kidney disease  History of prior DVT  Hypertension  Hyperlipidemia  PAD  LE angiogram 2002 >> L SFA occluded  Bradycardia  Beta-blocker stopped  [name] was last seen via telemedicine in April 2020.  [name] returns for follow-up.  [name] is here alone.  Since last seen, [name] has not had chest pain, significant shortness of breath, orthopnea, leg swelling.  [name] is mainly bothered by left knee pain.  It sounds like [name] will probably need knee replacement surgery.  However, [name] will need to remain in rehab for a few weeks postop.  [name] is concerned about this given COVID-19 and prefers to hold off for now.  [name] does bring in a recent health screening that picked up the L sided PAD as well as bilateral carotid bruits.  [name] has pain in her L knee that radiates up but no clear claudication symptoms.     Prior CV studies:   The following studies were reviewed today:  Echocardiogram 09/21/2017 Moderate LVH, EF 60-65, normal wall motion, grade 2 diastolic dysfunction, mild to moderate MAC, mildly dilated RV, mildly reduced RVSF, mild RAE, PASP 73  Echocardiogram 01/02/2016 Moderate concentric LVH, EF 65-70, normal wall motion grade 3 diastolic dysfunction, calcified aortic valve, MAC, mild MR  LE Angiogram 11/2000 FINAL IMPRESSION: 1. Mild bilateral renal artery stenosis. 2. Left superficial femoral artery occlusion. 3. 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   Nuclear stress test 09/2000 IMPRESSION 1. NORMAL PERFUSION. NO ISCHEMIA. 2. EJECTION FRACTION IS 71%.  Past Medical History:  Diagnosis Date  . Arthritis   . Chronic diastolic CHF (congestive heart failure) ([place])    a. Echo 01/02/16: Moderate concentric LVH, vigorous LV function, EF 65-70%, normal wall motion, grade 3 diastolic dysfunction, calcified aortic valve, MAC, mild MR, mild LAE  . CKD (chronic kidney disease)    Sees Dr. Moshe Cipro who took her off benicar in 2013.             Marland Kitchen Heart murmur    long time ago--no problems  . Hypercholesteremia   . Hypertensive heart and kidney disease with heart failure and with chronic kidney disease stage IV [facility])    Surgical Hx: The patient  has a past surgical history that includes Joint replacement; failed knee surgery; Cataract extraction w/PHACO (Right, 03/20/2014); and left ankle orif.   Current Medications: Current Meds  Medication Sig  . allopurinol (ZYLOPRIM) 100 MG tablet Take 100 mg by mouth 2 (two) times daily.  Marland Kitchen aspirin EC 81 MG tablet Take 81 mg by mouth daily.  Marland Kitchen ENULOSE 10 GM/15ML SOLN Place 15 mLs rectally daily.  . ferrous sulfate 325 (65 FE) MG tablet Take 325 mg by mouth every evening.   . hydrALAZINE (APRESOLINE) 100 MG tablet Take 100 mg by mouth 3 (three) times daily.   Marland Kitchen HYDROcodone-acetaminophen (NORCO) 10-325 MG tablet Take 1 tablet by mouth every 6 (six) hours as needed  for moderate pain.   . isosorbide mononitrate (IMDUR) 120 MG 24 hr tablet Take 1 tablet (120 mg total) by mouth daily.  Marland Kitchen levothyroxine (SYNTHROID, LEVOTHROID) 75 MCG tablet Take 75 mcg by mouth daily.  . Multiple Vitamin (MULTIVITAMIN WITH MINERALS) TABS tablet Take 1 tablet by mouth daily.  . rosuvastatin (CRESTOR) 10 MG tablet Take 10 mg by mouth every evening.  . torsemide (DEMADEX) 20 MG tablet Take 2 tablets (40 mg) daily twice per day  . traZODone (DESYREL) 100 MG tablet TK 1 T PO QHS PRN.     Allergies:   Naprosyn [naproxen]    Social History   Tobacco Use  . Smoking status: Never Smoker  . Smokeless tobacco: Never Used  Substance Use Topics  . Alcohol use: Yes    Alcohol/week: 1.0 standard drinks    Types: 1 Standard drinks or equivalent per week    Comment: occasional  . Drug use: No     Family Hx: The patient's family history includes Diabetes Mellitus II in her daughter; Hypertension in her daughter and mother; Stroke in her father. There is no history of Heart attack.  ROS:   Please see the history of present illness.    ROS All other systems reviewed and are negative.   EKGs/Labs/Other Test Reviewed:    EKG:  EKG is  ordered today.  The ekg ordered today demonstrates normal sinus rhythm, HR 63, normal axis, j point elevation, QTc 454, no change from prior tracing   Recent Labs: No results found for requested labs within last 8760 hours.   Recent Lipid Panel No results found for: CHOL, TRIG, HDL, CHOLHDL, LDLCALC, LDLDIRECT  Physical Exam:    VS:  BP (!) 150/54   Pulse 63   Ht _0  (1.651 m)   Wt 218 lb (98.9 kg)   SpO2 98%   BMI 36.28 kg/m     Wt Readings from Last 3 Encounters:  06/20/19 218 lb (98.9 kg)  12/19/18 210 lb (95.3 kg)  06/21/18 207 lb 1.9 oz (93.9 kg)     Physical Exam  Constitutional: [name] is oriented to person, place, and time. [name] appears well-developed and well-nourished. No distress.  HENT:  Head: Normocephalic and atraumatic.  Eyes: No scleral icterus.  Neck: No thyromegaly present.  Cardiovascular: Normal rate and regular rhythm.  Murmur heard.  Early systolic murmur is present with a grade of 1/6 at the upper right sternal border. Pulses:      Carotid pulses are on the right side with bruit and on the left side with bruit. Pulmonary/Chest: Effort normal. [name] has no wheezes. [name] has no rales.  Abdominal: Soft.  Musculoskeletal:        General: No edema.  Lymphadenopathy:    [name] has no cervical adenopathy.  Neurological: [name] is alert and oriented to  person, place, and time.  Skin: Skin is warm and dry.  Psychiatric: [name] has a normal mood and affect.    ASSESSMENT & PLAN:    1. Chronic diastolic heart failure (HCC) EF 60-65 with moderate diastolic dysfunction by echo in January 2019.    [name] is mainly limited by knee pain.  Volume status appears stable.  Continue current therapy.  2. Hypertensive heart and kidney disease with heart failure and with chronic kidney disease stage IV (HCC) Blood pressure above target.  [name] is no longer on amlodipine and clonidine.  [name] thinks that [name] is still taking these.  I have asked her to check her  bottles when [name] gets home and let us know.  Continue to monitor blood pressure.  3. Chronic kidney disease, stage IV (severe) ([place]) [name] is followed by nephrology.  Creatinine tends to run in the 2 range.  Most recent creatinine in August 2020 was 2.5.  4. Bilateral carotid bruits Noted on recent health screening.  [name] has fairly loud bruits bilaterally.  Arrange carotid ultrasound.  5. Chronic pain of left knee [name] may need surgery in the future.  RCRI = 6.6% risk of perioperative major cardiac event.  We will need to plan on doing a Lexiscan Myoview, when [name] is ready, for risk stratification.     Dispo:  Return in about 6 months (around 12/19/2019) for Routine Follow Up, w/ Richardson Dopp, PA-C, in person.   Medication Adjustments/Labs and Tests Ordered: Current medicines are reviewed at length with the patient today.  Concerns regarding medicines are outlined above.  Tests Ordered: Orders Placed This Encounter  Procedures  . EKG 12-Lead   Medication Changes: No orders of the defined types were placed in this encounter.   Signed, Richardson Dopp, PA-C  06/20/2019 4:57 PM    [facility] [place], [place], [place]  [address] Phone: [phone]; Fax: [phone]

## 2019-06-20 NOTE — Patient Instructions (Signed)
Medication Instructions:  Your physician recommends that you continue on your current medications as directed. Please refer to the Current Medication list given to you today.  *If you need a refill on your cardiac medications before your next appointment, please call your pharmacy*  Lab Work: -None If you have labs (blood work) drawn today and your tests are completely normal, you will receive your results only by: Marland Kitchen MyChart Message (if you have MyChart) OR . A paper copy in the mail If you have any lab test that is abnormal or we need to change your treatment, we will call you to review the results.  Testing/Procedures: Your physician has requested that you have a carotid duplex. This test is an ultrasound of the carotid arteries in your neck. It looks at blood flow through these arteries that supply the brain with blood. Allow one hour for this exam. There are no restrictions or special instructions.    Follow-Up: At [facility], you and your health needs are our priority.  As part of our continuing mission to provide you with exceptional heart care, we have created designated Provider Care Teams.  These Care Teams include your primary Cardiologist (physician) and Advanced Practice Providers (APPs -  Physician Assistants and Nurse Practitioners) who all work together to provide you with the care you need, when you need it.  Your next appointment:   6 months  The format for your next appointment:   In Person  Provider:   Richardson Dopp, PA-C  Other Instructions 1. Check to see if you are taking clonidine and amlodipine and call Richardson Dopp, PA-C.

## 2019-07-02 ENCOUNTER — Other Ambulatory Visit (HOSPITAL_COMMUNITY): Payer: Self-pay | Admitting: Internal Medicine

## 2019-07-02 DIAGNOSIS — R0989 Other specified symptoms and signs involving the circulatory and respiratory systems: Secondary | ICD-10-CM

## 2019-07-04 ENCOUNTER — Other Ambulatory Visit (HOSPITAL_COMMUNITY): Payer: Self-pay | Admitting: Physician Assistant

## 2019-07-04 DIAGNOSIS — R0989 Other specified symptoms and signs involving the circulatory and respiratory systems: Secondary | ICD-10-CM

## 2019-07-05 ENCOUNTER — Other Ambulatory Visit: Payer: Self-pay

## 2019-07-05 ENCOUNTER — Other Ambulatory Visit (HOSPITAL_COMMUNITY): Payer: Self-pay | Admitting: Physician Assistant

## 2019-07-05 ENCOUNTER — Ambulatory Visit (HOSPITAL_COMMUNITY)
Admission: RE | Admit: 2019-07-05 | Discharge: 2019-07-05 | Disposition: A | Discharge status: Home / Self Care | Payer: Medicare Other | Source: Ambulatory Visit | Attending: Cardiology | Admitting: Cardiology

## 2019-07-05 DIAGNOSIS — R0989 Other specified symptoms and signs involving the circulatory and respiratory systems: Secondary | ICD-10-CM

## 2019-07-05 DIAGNOSIS — I6523 Occlusion and stenosis of bilateral carotid arteries: Secondary | ICD-10-CM

## 2019-07-06 ENCOUNTER — Telehealth: Payer: Self-pay

## 2019-07-06 ENCOUNTER — Other Ambulatory Visit: Payer: Self-pay

## 2019-07-06 DIAGNOSIS — I6523 Occlusion and stenosis of bilateral carotid arteries: Secondary | ICD-10-CM

## 2019-07-06 NOTE — Telephone Encounter (Signed)
Notes recorded by Frederik Schmidt, RN on 07/06/2019 at 11:17 AM EST  I spoke to the patient and her daughter with results and recommendations.  ------

## 2019-07-06 NOTE — Telephone Encounter (Signed)
-----   Message from Liliane Shi, [place] sent at 07/06/2019 10:18 AM EST ----- The Carotid ultrasound shows bilateral carotid artery plaque.  There is worse stenosis on the left than the right.  There is also evidence of stenosis in the artery running up the back of the brain on the right as well as both arteries leading into the arms.  I think it would be good to have her followed by vascular surgery to keep a close eye on this.   PLAN:   -Refer to Vascular Surgery ([facility]VS) for carotid disease  -Continue ASA daily; Continue Rosuvastatin  -Request most recent Lipid panel done in 03/2019 (may have been done by PCP).  -Please send a copy to her PCP. Richardson Dopp, PA-C    07/06/2019 10:06 AM

## 2019-07-06 NOTE — Telephone Encounter (Signed)
Notes recorded by Frederik Schmidt, RN on 07/06/2019 at 10:59 AM EST  I reached out to patient's PCP and they said that they gave the patient a lab requisition for [facility], but the patient never went to have drawn.  ------

## 2019-07-06 NOTE — Telephone Encounter (Signed)
Notes recorded by Frederik Schmidt, RN on 07/06/2019 at 10:53 AM EST  [id number] 11/6  ------

## 2019-08-10 ENCOUNTER — Other Ambulatory Visit: Payer: Self-pay

## 2019-08-10 ENCOUNTER — Telehealth (HOSPITAL_COMMUNITY): Payer: Self-pay | Admitting: *Deleted

## 2019-08-10 DIAGNOSIS — I6529 Occlusion and stenosis of unspecified carotid artery: Secondary | ICD-10-CM

## 2019-08-10 NOTE — Telephone Encounter (Signed)

## 2019-08-13 ENCOUNTER — Other Ambulatory Visit: Payer: Self-pay

## 2019-08-13 ENCOUNTER — Ambulatory Visit (INDEPENDENT_AMBULATORY_CARE_PROVIDER_SITE_OTHER): Payer: Medicare Other | Admitting: Surgery

## 2019-08-13 ENCOUNTER — Encounter: Payer: Self-pay | Admitting: Surgery

## 2019-08-13 ENCOUNTER — Ambulatory Visit (HOSPITAL_COMMUNITY)
Admission: RE | Admit: 2019-08-13 | Discharge: 2019-08-13 | Disposition: A | Discharge status: Home / Self Care | Payer: Medicare Other | Source: Ambulatory Visit | Attending: Surgery | Admitting: Surgery

## 2019-08-13 VITALS — BP 159/59 | HR 62 | Temp 97.7°F | Resp 20 | Ht 65.0 in | Wt 218.0 lb

## 2019-08-13 DIAGNOSIS — I6529 Occlusion and stenosis of unspecified carotid artery: Secondary | ICD-10-CM | POA: Insufficient documentation

## 2019-08-13 DIAGNOSIS — R600 Localized edema: Secondary | ICD-10-CM | POA: Diagnosis not present

## 2019-08-13 DIAGNOSIS — I6523 Occlusion and stenosis of bilateral carotid arteries: Secondary | ICD-10-CM

## 2019-08-13 NOTE — Progress Notes (Signed)
[facility] [place]  Patient name: [name] MRN: [id number] DOB: 19-Aug-1929 Sex: female   REQUESTING PROVIDER:    Dr. Kathlen Mody   REASON FOR CONSULT:    Carotid disease  HISTORY OF PRESENT ILLNESS:   [name] is a 85 y.o. female, who is referred for evaluation of carotid stenosis which was detected by ultrasound following auscultation of a bruit.  She is asymptomatic.  Specifically she denies numbness or weakness in either extremity.  She denies slurred speech.  She denies amaurosis fugax.  The patient has history of lower extremity peripheral vascular disease.  She had arteriogram in 2002 that showed an occluded left superficial femoral artery and significant stenosis on the right.  She denies having any rest pain however she does say that her legs are heavy.  She does ambulate on occasion.  She does not have any open wounds.  She suffers from chronic diastolic heart failure.  She does have b she is medically managed for hypertension.  She has stage IV renal insufficiency with creatinine in the mid 2 range.  She takes a statin for hypercholesterolemia.  Ilateral lower extremity edema for which she wears compression stockings.  PAST MEDICAL HISTORY    Past Medical History:  Diagnosis Date  . Arthritis   . Chronic diastolic CHF (congestive heart failure) ([place])    a. Echo 01/02/16: Moderate concentric LVH, vigorous LV function, EF 65-70%, normal wall motion, grade 3 diastolic dysfunction, calcified aortic valve, MAC, mild MR, mild LAE  . CKD (chronic kidney disease)    Sees Dr. Moshe Cipro who took her off benicar in 2013.             Marland Kitchen Heart murmur    long time ago--no problems  . Hypercholesteremia   . Hypertensive heart and kidney disease with heart failure and with chronic kidney disease stage IV (HCC)      FAMILY HISTORY   Family History  Problem Relation Age of Onset  . Hypertension Mother   . Stroke  Father   . Diabetes Mellitus II Daughter   . Hypertension Daughter   . Heart attack Neg Hx     SOCIAL HISTORY:   Social History   Socioeconomic History  . Marital status: Single    Spouse name: Not on file  . Number of children: Not on file  . Years of education: Not on file  . Highest education level: Not on file  Occupational History  . Not on file  Tobacco Use  . Smoking status: Never Smoker  . Smokeless tobacco: Never Used  Substance and Sexual Activity  . Alcohol use: Yes    Alcohol/week: 1.0 standard drinks    Types: 1 Standard drinks or equivalent per week    Comment: occasional  . Drug use: No  . Sexual activity: Not on file  Other Topics Concern  . Not on file  Social History Narrative   Retired   Worked at [employer] x 25 years ([place])   [employer] childcare business for 20 years   Divorced   6 kids (3 boys, 3 girls)   23 grandkids   Social Determinants of Radio broadcast assistant Strain:   . Difficulty of Paying Living Expenses: Not on file  Food Insecurity:   . Worried About Charity fundraiser in the Last Year: Not on file  . Ran Out of Food in the Last Year: Not on file  Transportation Needs:   . Lack of Transportation (Medical):  Not on file  . Lack of Transportation (Non-Medical): Not on file  Physical Activity:   . Days of Exercise per Week: Not on file  . Minutes of Exercise per Session: Not on file  Stress:   . Feeling of Stress : Not on file  Social Connections:   . Frequency of Communication with Friends and Family: Not on file  . Frequency of Social Gatherings with Friends and Family: Not on file  . Attends Religious Services: Not on file  . Active Member of Clubs or Organizations: Not on file  . Attends Archivist Meetings: Not on file  . Marital Status: Not on file  Intimate Partner Violence:   . Fear of Current or Ex-Partner: Not on file  . Emotionally Abused: Not on file  . Physically Abused: Not on file  . Sexually  Abused: Not on file    ALLERGIES:    Allergies  Allergen Reactions  . Naprosyn [Naproxen] Itching    Hot burning feeling    CURRENT MEDICATIONS:    Current Outpatient Medications  Medication Sig Dispense Refill  . allopurinol (ZYLOPRIM) 100 MG tablet Take 100 mg by mouth 2 (two) times daily.  3  . aspirin EC 81 MG tablet Take 81 mg by mouth daily.    Marland Kitchen ENULOSE 10 GM/15ML SOLN Place 15 mLs rectally daily.    . ferrous sulfate 325 (65 FE) MG tablet Take 325 mg by mouth every evening.     . hydrALAZINE (APRESOLINE) 100 MG tablet Take 100 mg by mouth 3 (three) times daily.     Marland Kitchen HYDROcodone-acetaminophen (NORCO) 10-325 MG tablet Take 1 tablet by mouth every 6 (six) hours as needed for moderate pain.     . hydrOXYzine (VISTARIL) 50 MG capsule     . isosorbide mononitrate (IMDUR) 120 MG 24 hr tablet Take 1 tablet (120 mg total) by mouth daily. 90 tablet 3  . levothyroxine (SYNTHROID, LEVOTHROID) 75 MCG tablet Take 75 mcg by mouth daily.  11  . Multiple Vitamin (MULTIVITAMIN WITH MINERALS) TABS tablet Take 1 tablet by mouth daily.    . rosuvastatin (CRESTOR) 10 MG tablet Take 10 mg by mouth every evening.    . torsemide (DEMADEX) 20 MG tablet Take 2 tablets (40 mg) daily twice per day 180 tablet 3  . traZODone (DESYREL) 100 MG tablet TK 1 T PO QHS PRN.     No current facility-administered medications for this visit.    REVIEW OF SYSTEMS:   _0  denotes positive finding, _1  denotes negative finding Cardiac  Comments:  Chest pain or chest pressure:    Shortness of breath upon exertion: x   Short of breath when lying flat:    Irregular heart rhythm:        Vascular    Pain in calf, thigh, or hip brought on by ambulation: x   Pain in feet at night that wakes you up from your sleep:     Blood clot in your veins:    Leg swelling:  x       Pulmonary    Oxygen at home:    Productive cough:     Wheezing:         Neurologic    Sudden weakness in arms or legs:     Sudden  numbness in arms or legs:     Sudden onset of difficulty speaking or slurred speech:    Temporary loss of vision in one eye:     Problems  with dizziness:         Gastrointestinal    Blood in stool:      Vomited blood:         Genitourinary    Burning when urinating:     Blood in urine:        Psychiatric    Major depression:         Hematologic    Bleeding problems:    Problems with blood clotting too easily:        Skin    Rashes or ulcers:        Constitutional    Fever or chills:     PHYSICAL EXAM:   Vitals:   08/13/19 1346 08/13/19 1348  BP: 120/64 (!) 159/59  Pulse: 62   Resp: 20   Temp: 97.7 F (36.5 C)   SpO2: 98%   Weight: 98.9 kg   Height: _0  (1.651 m)     GENERAL: The patient is a well-nourished female, in no acute distress. The vital signs are documented above. CARDIAC: There is a regular rate and rhythm.  VASCULAR: Biphasic peroneal Doppler signals bilaterally PULMONARY: Nonlabored respirations  MUSCULOSKELETAL: There are no major deformities or cyanosis. NEUROLOGIC: No focal weakness or paresthesias are detected. SKIN: There are no ulcers or rashes noted. PSYCHIATRIC: The patient has a normal affect.  STUDIES:   I have reviewed th e following carotid duplex Right Carotid: Velocities in the right ICA are consistent with a 60-79%                stenosis. The ECA appears >50% stenosed.  Left Carotid: Velocities in the left ICA are consistent with a 60-79% stenosis.               The ECA appears >50% stenosed.  Vertebrals:  Left vertebral artery demonstrates antegrade flow. Right vertebral              artery demonstrates high resistant flow. Subclavians: Bilateral subclavian artery flow was disturbed. ASSESSMENT and PLAN   Carotid: Ultrasound shows bilateral 60 to 79% stenosis.  She is asymptomatic.  I would recommend intervention if she develops symptoms or stenosis greater than 80%.  I will schedule her for follow-up in 6 months with  repeat ultrasound  Leg heaviness:  Recommend compression stockings.  She was measured today and given instructions on ordering them from elastic therapy.   Leia Alf, MD, FACS [facility] [phone] Pager [phone]

## 2019-08-29 ENCOUNTER — Other Ambulatory Visit: Payer: Self-pay | Admitting: *Deleted

## 2019-08-29 DIAGNOSIS — I6523 Occlusion and stenosis of bilateral carotid arteries: Secondary | ICD-10-CM

## 2019-09-25 ENCOUNTER — Ambulatory Visit: Payer: Medicare Other

## 2019-10-04 ENCOUNTER — Ambulatory Visit: Payer: Medicare Other | Attending: Internal Medicine

## 2019-10-04 DIAGNOSIS — Z23 Encounter for immunization: Secondary | ICD-10-CM | POA: Insufficient documentation

## 2019-10-04 NOTE — Progress Notes (Signed)
   Covid-19 Vaccination Clinic  Name:  [name]    MRN: [id number] DOB: [birth year]  10/04/2019  [name] was observed post Covid-19 immunization for 15 minutes without incidence. She was provided with Vaccine Information Sheet and instruction to access the V-Safe system.   [name] was instructed to call 911 with any severe reactions post vaccine: Marland Kitchen Difficulty breathing  . Swelling of your face and throat  . A fast heartbeat  . A bad rash all over your body  . Dizziness and weakness    Immunizations Administered    Name Date Dose VIS Date Route   Pfizer COVID-19 Vaccine 10/04/2019  4:07 PM 0.3 mL 08/10/2019 Intramuscular   Manufacturer: [place]   Lot: [id number]   [place]: [id number]

## 2019-10-12 ENCOUNTER — Ambulatory Visit: Payer: Medicare Other

## 2019-10-29 ENCOUNTER — Ambulatory Visit: Payer: Medicare Other | Attending: Internal Medicine

## 2019-10-29 DIAGNOSIS — Z23 Encounter for immunization: Secondary | ICD-10-CM | POA: Insufficient documentation

## 2019-10-29 NOTE — Progress Notes (Signed)
   Covid-19 Vaccination Clinic  Name:  [name]    MRN: [id number] DOB: 02/11/1929  10/29/2019  [name] was observed post Covid-19 immunization for 15 minutes without incidence. She was provided with Vaccine Information Sheet and instruction to access the V-Safe system.   Ms. Blot was instructed to call 911 with any severe reactions post vaccine: Marland Kitchen Difficulty breathing  . Swelling of your face and throat  . A fast heartbeat  . A bad rash all over your body  . Dizziness and weakness    Immunizations Administered    Name Date Dose VIS Date Route   Pfizer COVID-19 Vaccine 10/29/2019  5:50 PM 0.3 mL 08/10/2019 Intramuscular   Manufacturer: [place]   Lot: [id number]   [place]: [id number]

## 2019-11-05 ENCOUNTER — Other Ambulatory Visit: Payer: Self-pay | Admitting: Physician Assistant

## 2019-11-06 ENCOUNTER — Other Ambulatory Visit: Payer: Self-pay | Admitting: Physician Assistant

## 2019-11-07 ENCOUNTER — Other Ambulatory Visit: Payer: Self-pay

## 2019-11-07 MED ORDER — TORSEMIDE 20 MG PO TABS: ORAL_TABLET | ORAL | 3 refills | Status: DC

## 2019-11-07 MED ORDER — ISOSORBIDE MONONITRATE ER 120 MG PO TB24: 120.0000 mg | ORAL_TABLET | Freq: Every day | ORAL | 2 refills | Status: DC

## 2019-11-12 ENCOUNTER — Other Ambulatory Visit: Payer: Self-pay | Admitting: Physician Assistant

## 2019-11-13 MED ORDER — TORSEMIDE 20 MG PO TABS: 40.0000 mg | ORAL_TABLET | Freq: Two times a day (BID) | ORAL | 1 refills | Status: DC

## 2019-12-18 ENCOUNTER — Ambulatory Visit: Payer: Medicare Other | Admitting: Physician Assistant

## 2019-12-18 ENCOUNTER — Ambulatory Visit: Payer: Medicare Other | Admitting: Cardiology

## 2019-12-18 NOTE — Progress Notes (Deleted)
Cardiology Office Note   Date:  12/18/2019   ID:  [name], DOB 23-Jan-1929, MRN [id number]  PCP:  [name] Jewel, MD  Cardiologist: Dr. Acie Fredrickson, MD   No chief complaint on file.     History of Present Illness: [name] is a 86 y.o. female who presents for 7-monthfollow-up, seen for Dr. NAcie Fredrickson  Ms. BGieselmanhas a history of chronic diastolic CHF per echocardiogram 08/2017 with an LVEF at 60 to 65% with G2 DD, chronic kidney disease, history of prior DVT, hypertension, hyperlipidemia, PAD with LE angiogram in 2002 with occluded left SFA followed by Dr. BTrula Sladeand bradycardia for which her beta-blocker was stopped.  She was last seen by [name] Dopp PA 06/20/2019 in follow-up and was doing well from a CV standpoint.  Her knees were bothering her with plans for probable knee replacement surgery.  Has been most recently evaluated by Dr. BTrula Sladefor carotid artery disease.   Right ICA consistent with 60 to 79% stenosis, left ICA consistent with 60 to 79% stenosis.  In the absence of symptoms, intervention was deferred with close follow-up for repeat ultrasound.  If she develops symptoms or stenosis becomes greater than 80% plan per Dr. BTrula Sladewas for intervention.   1. Chronic diastolic heart failure: -LVEF per echocardiogram at 60 to 65% with moderate diastolic dysfunction G2 DD  -Physical activity mainly limited by knee pain  -Appears euvolemic on exam  -Continue current regimen    2.  Hypertension: -Elevated, -Most recently with some confusion regarding her antihypertensive therapy.  Previously on amlodipine, clonidine however she was to check these and get back to our office -Continue  3. CKD stage IV: -Followed by nephrology -Baseline creatinine around the 2.0 range -Most recent creatinine, 2.5 in 03/2019   4. Bilateral carotid bruits: -Bilateral carotid bruits found on health screening with follow-up imaging showing bilateral 60 to 79% stenosis in the right  and left ICA vessels -Currently followed by Dr. BTrula Sladewith plans to monitor progression given the absence of symptoms   5. Chronic pain of left knee: She may need surgery in the future.  RCRI = 6.6% risk of perioperative major cardiac event.  We will need to plan on doing a Lexiscan Myoview, when she is ready, for risk stratification.      Past Medical History:  Diagnosis Date  . Arthritis   . Chronic diastolic CHF (congestive heart failure) ([place]    a. Echo 01/02/16: Moderate concentric LVH, vigorous LV function, EF 65-70%, normal wall motion, grade 3 diastolic dysfunction, calcified aortic valve, MAC, mild MR, mild LAE  . CKD (chronic kidney disease)    Sees [name] took her off benicar in 2013.             .[name] KitchenHeart murmur    long time ago--no problems  . Hypercholesteremia   . Hypertensive heart and kidney disease with heart failure and with chronic kidney disease stage IV ([facility]     Past Surgical History:  Procedure Laterality Date  . CATARACT EXTRACTION W/PHACO Right 03/20/2014   Procedure: CATARACT EXTRACTION PHACO AND INTRAOCULAR LENS PLACEMENT (IOC);  Surgeon: [name] Pearson MD;  [place]  Service: Ophthalmology;  Laterality: Right;  . failed knee surgery     on the right x 2  . JOINT REPLACEMENT     both knees  . left ankle orif       Current Outpatient Medications  Medication Sig Dispense Refill  . allopurinol (ZYLOPRIM)  100 MG tablet Take 100 mg by mouth 2 (two) times daily.  3  . aspirin EC 81 MG tablet Take 81 mg by mouth daily.    [name] ENULOSE 10 GM/15ML SOLN Place 15 mLs rectally daily.    . ferrous sulfate 325 (65 FE) MG tablet Take 325 mg by mouth every evening.     . hydrALAZINE (APRESOLINE) 100 MG tablet Take 100 mg by mouth 3 (three) times daily.     [name] HYDROcodone-acetaminophen (NORCO) 10-325 MG tablet Take 1 tablet by mouth every 6 (six) hours as needed for moderate pain.     . hydrOXYzine (VISTARIL) 50 MG capsule     . isosorbide  mononitrate (IMDUR) 120 MG 24 hr tablet Take 1 tablet (120 mg total) by mouth daily. 90 tablet 2  . levothyroxine (SYNTHROID, LEVOTHROID) 75 MCG tablet Take 75 mcg by mouth daily.  11  . Multiple Vitamin (MULTIVITAMIN WITH MINERALS) TABS tablet Take 1 tablet by mouth daily.    . rosuvastatin (CRESTOR) 10 MG tablet Take 10 mg by mouth every evening.    . torsemide (DEMADEX) 20 MG tablet Take 2 tablets (40 mg total) by mouth 2 (two) times daily. 360 tablet 1  . traZODone (DESYREL) 100 MG tablet TK 1 T PO QHS PRN.     No current facility-administered medications for this visit.    Allergies:   Naprosyn [naproxen]    Social History:  The patient  reports that she has never smoked. She has never used smokeless tobacco. She reports current alcohol use of about 1.0 standard drinks of alcohol per week. She reports that she does not use drugs.   Family History:  The patient's ***family history includes Diabetes Mellitus II in her daughter; Hypertension in her daughter and mother; Stroke in her father.    ROS:  Please see the history of present illness.   Otherwise, review of systems are positive for {NONE DEFAULTED:18576::"none"}.   All other systems are reviewed and negative.    PHYSICAL EXAM: VS:  There were no vitals taken for this visit. , BMI There is no height or weight on file to calculate BMI. GEN: Well nourished, well developed, in no acute distress HEENT: normal Neck: no JVD, carotid bruits, or masses Cardiac: ***RRR; no murmurs, rubs, or gallops,no edema  Respiratory:  clear to auscultation bilaterally, normal work of breathing GI: soft, nontender, nondistended, + BS MS: no deformity or atrophy Skin: warm and dry, no rash Neuro:  Strength and sensation are intact Psych: euthymic mood, full affect   EKG:  EKG {ACTION; IS/IS [id number]} ordered today. The ekg ordered today demonstrates ***   Recent Labs: No results found for requested labs within last 8760 hours.    Lipid  Panel No results found for: CHOL, TRIG, HDL, CHOLHDL, VLDL, LDLCALC, LDLDIRECT    Wt Readings from Last 3 Encounters:  08/13/19 218 lb (98.9 kg)  06/20/19 218 lb (98.9 kg)  12/19/18 210 lb (95.3 kg)      Other studies Reviewed: Additional studies/ records that were reviewed today include: ***. Review of the above records demonstrates: ***  Right Carotid: Velocities in the right ICA are consistent with a 60-79% stenosis. The ECA appears >50% stenosed.  Left Carotid: Velocities in the left ICA are consistent with a 60-79% stenosis. The ECA appears >50% stenosed.  Vertebrals: Left vertebral artery demonstrates antegrade flow. Right vertebral artery demonstrates high resistant flow. Subclavians: Bilateral subclavian artery flow was disturbed.   Echocardiogram 09/21/2017 Moderate LVH, EF  60-65, normal wall motion, grade 2 diastolic dysfunction, mild to moderate MAC, mildly dilated RV, mildly reduced RVSF, mild RAE, PASP 73  Echocardiogram 01/02/2016 Moderate concentric LVH, EF 65-70, normal wall motion grade 3 diastolic dysfunction, calcified aortic valve, MAC, mild MR  LE Angiogram 11/2000 FINAL IMPRESSION: 1. Mild bilateral renal artery stenosis. 2. Left superficial femoral artery occlusion. 3. 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  Nuclear stress test 09/2000 IMPRESSION 1. NORMAL PERFUSION. NO ISCHEMIA. 2. EJECTION FRACTION IS 71%.     ASSESSMENT AND PLAN:  1.  ***   Current medicines are reviewed at length with the patient today.  The patient {ACTIONS; HAS/DOES NOT HAVE:19233} concerns regarding medicines.  The following changes have been made:  {PLAN; NO CHANGE:13088:s}  Labs/ tests ordered today include: *** No orders of the defined types were placed in this encounter.    Disposition:   FU with *** in {gen number [id number]} {Days to years:10300}  Signed, Kathyrn Drown, NP  12/18/2019  5:05 AM    [facility] [place], [place], [place]  [address] Phone: [phone]; Fax: [phone]

## 2019-12-18 NOTE — Progress Notes (Deleted)
Cardiology Office Note   Date:  12/18/2019   ID:  [name], DOB October 27, 1928, MRN [id number]  PCP:  [name] Jewel, MD  Cardiologist: Dr. Acie Fredrickson, MD   No chief complaint on file.     History of Present Illness: [name] is a 86 y.o. female who presents for for 17-monthfollow-up, seen for Dr. NAcie Fredrickson  Ms. BDemarinishas a history of chronic diastolic CHF per echocardiogram 08/2017 with an LVEF at 60 to 65% with G2 DD, chronic kidney disease, history of prior DVT, hypertension, hyperlipidemia, PAD with LE angiogram in 2002 with occluded left SFA followed by Dr. BTrula Sladeand bradycardia for which her beta-blocker was stopped.  She was last seen by SRichardson Dopp PA 06/20/2019 in follow-up and was doing well from a CV standpoint.  Her knees were bothering her with plans for probable knee replacement surgery.  Has been most recently evaluated by Dr. BTrula Sladefor carotid artery disease.   Right ICA consistent with 60 to 79% stenosis, left ICA consistent with 60 to 79% stenosis.  In the absence of symptoms, intervention was deferred with close follow-up for repeat ultrasound.  If she develops symptoms or stenosis becomes greater than 80% plan per Dr. BTrula Sladewas for intervention.   1. Chronic diastolic heart failure: -LVEF per echocardiogram at 60 to 65% with moderate diastolic dysfunction G2 DD  -Physical activity mainly limited by knee pain  -Appears euvolemic on exam  -Continue current regimen    2.  Hypertension: -Elevated, -Most recently with some confusion regarding her antihypertensive therapy.  Previously on amlodipine, clonidine however she was to check these and get back to our office -Continue  3. CKD stage IV: -Followed by nephrology -Baseline creatinine around the 2.0 range -Most recent creatinine, 2.5 in 03/2019   4. Bilateral carotid bruits: -Bilateral carotid bruits found on health screening with follow-up imaging showing bilateral 60 to 79% stenosis  in the right and left ICA vessels -Currently followed by Dr. BTrula Sladewith plans to monitor progression given the absence of symptoms   5. Chronic pain of left knee: She may need surgery in the future.RCRI = 6.6% risk of perioperative major cardiac event. We will need to plan on doing a Lexiscan Myoview, when she is ready, for risk stratification.       Past Medical History:  Diagnosis Date  . Arthritis   . Chronic diastolic CHF (congestive heart failure) ([facility]    a. Echo 01/02/16: Moderate concentric LVH, vigorous LV function, EF 65-70%, normal wall motion, grade 3 diastolic dysfunction, calcified aortic valve, MAC, mild MR, mild LAE  . CKD (chronic kidney disease)    Sees Dr. GMoshe Ciprowho took her off benicar in 2013.             .[name] KitchenHeart murmur    long time ago--no problems  . Hypercholesteremia   . Hypertensive heart and kidney disease with heart failure and with chronic kidney disease stage IV ([facility]     Past Surgical History:  Procedure Laterality Date  . CATARACT EXTRACTION W/PHACO Right 03/20/2014   Procedure: CATARACT EXTRACTION PHACO AND INTRAOCULAR LENS PLACEMENT (IOC);  Surgeon: RMarylynn Pearson MD;  [facility]  Service: Ophthalmology;  Laterality: Right;  . failed knee surgery     on the right x 2  . JOINT REPLACEMENT     both knees  . left ankle orif       Current Outpatient Medications  Medication Sig Dispense Refill  . allopurinol (ZYLOPRIM) 100  MG tablet Take 100 mg by mouth 2 (two) times daily.  3  . aspirin EC 81 MG tablet Take 81 mg by mouth daily.    [name] ENULOSE 10 GM/15ML SOLN Place 15 mLs rectally daily.    . ferrous sulfate 325 (65 FE) MG tablet Take 325 mg by mouth every evening.     . hydrALAZINE (APRESOLINE) 100 MG tablet Take 100 mg by mouth 3 (three) times daily.     [name] HYDROcodone-acetaminophen (NORCO) 10-325 MG tablet Take 1 tablet by mouth every 6 (six) hours as needed for moderate pain.     . hydrOXYzine (VISTARIL) 50 MG capsule       . isosorbide mononitrate (IMDUR) 120 MG 24 hr tablet Take 1 tablet (120 mg total) by mouth daily. 90 tablet 2  . levothyroxine (SYNTHROID, LEVOTHROID) 75 MCG tablet Take 75 mcg by mouth daily.  11  . Multiple Vitamin (MULTIVITAMIN WITH MINERALS) TABS tablet Take 1 tablet by mouth daily.    . rosuvastatin (CRESTOR) 10 MG tablet Take 10 mg by mouth every evening.    . torsemide (DEMADEX) 20 MG tablet Take 2 tablets (40 mg total) by mouth 2 (two) times daily. 360 tablet 1  . traZODone (DESYREL) 100 MG tablet TK 1 T PO QHS PRN.     No current facility-administered medications for this visit.    Allergies:   Naprosyn [naproxen]    Social History:  The patient  reports that she has never smoked. She has never used smokeless tobacco. She reports current alcohol use of about 1.0 standard drinks of alcohol per week. She reports that she does not use drugs.   Family History:  The patient's ***family history includes Diabetes Mellitus II in her daughter; Hypertension in her daughter and mother; Stroke in her father.    ROS:  Please see the history of present illness.   Otherwise, review of systems are positive for {NONE DEFAULTED:18576::"none"}.   All other systems are reviewed and negative.    PHYSICAL EXAM: VS:  There were no vitals taken for this visit. , BMI There is no height or weight on file to calculate BMI. GEN: Well nourished, well developed, in no acute distress HEENT: normal Neck: no JVD, carotid bruits, or masses Cardiac: ***RRR; no murmurs, rubs, or gallops,no edema  Respiratory:  clear to auscultation bilaterally, normal work of breathing GI: soft, nontender, nondistended, + BS MS: no deformity or atrophy Skin: warm and dry, no rash Neuro:  Strength and sensation are intact Psych: euthymic mood, full affect   EKG:  EKG {ACTION; IS/IS PIR:51884166} ordered today. The ekg ordered today demonstrates ***   Recent Labs: No results found for requested labs within last 8760  hours.    Lipid Panel No results found for: CHOL, TRIG, HDL, CHOLHDL, VLDL, LDLCALC, LDLDIRECT    Wt Readings from Last 3 Encounters:  08/13/19 218 lb (98.9 kg)  06/20/19 218 lb (98.9 kg)  12/19/18 210 lb (95.3 kg)      Other studies Reviewed: Additional studies/ records that were reviewed today include: ***. Review of the above records demonstrates: ***  Right Carotid: Velocities in the right ICA are consistent with a 60-79% stenosis. The ECA appears >50% stenosed.  Left Carotid: Velocities in the left ICA are consistent with a 60-79% stenosis. The ECA appears >50% stenosed.  Vertebrals: Left vertebral artery demonstrates antegrade flow. Right vertebral artery demonstrates high resistant flow. Subclavians: Bilateral subclavian artery flow was disturbed.   Echocardiogram 09/21/2017 Moderate LVH, EF  60-65, normal wall motion, grade 2 diastolic dysfunction, mild to moderate MAC, mildly dilated RV, mildly reduced RVSF, mild RAE, PASP 73  Echocardiogram 01/02/2016 Moderate concentric LVH, EF 65-70, normal wall motion grade 3 diastolic dysfunction, calcified aortic valve,MAC, mild MR  LE Angiogram 11/2000 FINAL IMPRESSION: 1. Mild bilateral renal artery stenosis. 2. Left superficial femoral artery occlusion. 3. 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  Nuclear stress test 09/2000 IMPRESSION 1. NORMAL PERFUSION. NO ISCHEMIA. 2. EJECTION FRACTION IS 71%.    ASSESSMENT AND PLAN:  1.  ***   Current medicines are reviewed at length with the patient today.  The patient {ACTIONS; HAS/DOES NOT HAVE:19233} concerns regarding medicines.  The following changes have been made:  {PLAN; NO CHANGE:13088:s}  Labs/ tests ordered today include: *** No orders of the defined types were placed in this encounter.    Disposition:   FU with *** in {gen number 9-51:884166} {Days to years:10300}  Signed, Kathyrn Drown, NP  12/18/2019 9:00 AM    [name] [place], [place], [place]  [address] Phone: [phone]; Fax: [phone]

## 2019-12-20 ENCOUNTER — Ambulatory Visit: Payer: Medicare Other | Admitting: Cardiology

## 2020-01-02 ENCOUNTER — Ambulatory Visit (INDEPENDENT_AMBULATORY_CARE_PROVIDER_SITE_OTHER): Payer: Medicare Other | Admitting: Physician Assistant

## 2020-01-02 ENCOUNTER — Other Ambulatory Visit: Payer: Self-pay

## 2020-01-02 ENCOUNTER — Encounter: Payer: Self-pay | Admitting: Physician Assistant

## 2020-01-02 VITALS — BP 162/80 | HR 69 | Ht 65.0 in | Wt 221.0 lb

## 2020-01-02 DIAGNOSIS — I5032 Chronic diastolic (congestive) heart failure: Secondary | ICD-10-CM | POA: Diagnosis not present

## 2020-01-02 DIAGNOSIS — N184 Chronic kidney disease, stage 4 (severe): Secondary | ICD-10-CM

## 2020-01-02 DIAGNOSIS — I6523 Occlusion and stenosis of bilateral carotid arteries: Secondary | ICD-10-CM

## 2020-01-02 DIAGNOSIS — I130 Hypertensive heart and chronic kidney disease with heart failure and stage 1 through stage 4 chronic kidney disease, or unspecified chronic kidney disease: Secondary | ICD-10-CM

## 2020-01-02 NOTE — Patient Instructions (Signed)
Medication Instructions:   Your physician recommends that you continue on your current medications as directed. Please refer to the Current Medication list given to you today.  *If you need a refill on your cardiac medications before your next appointment, please call your pharmacy*  Lab Work:  None ordered today  Testing/Procedures:  None ordered today  Follow-Up: At [facility], you and your health needs are our priority.  As part of our continuing mission to provide you with exceptional heart care, we have created designated Provider Care Teams.  These Care Teams include your primary Cardiologist (physician) and Advanced Practice Providers (APPs -  Physician Assistants and Nurse Practitioners) who all work together to provide you with the care you need, when you need it.  We recommend signing up for the patient portal called "MyChart".  Sign up information is provided on this After Visit Summary.  MyChart is used to connect with patients for Virtual Visits (Telemedicine).  Patients are able to view lab/test results, encounter notes, upcoming appointments, etc.  Non-urgent messages can be sent to your provider as well.   To learn more about what you can do with MyChart, go to [email or web address].    Your next appointment:   6 month(s)  The format for your next appointment:   In Person  Provider:   Richardson Dopp, PA-C   Other Instructions  Check blood pressure once a day for a few weeks and send readings through mychart.

## 2020-01-02 NOTE — Progress Notes (Signed)
Cardiology Office Note:    Date:  01/02/2020   ID:  [name], DOB 1929/06/05, MRN [id number]  PCP:  Antonietta Jewel, MD  Cardiologist:  Mertie Moores, MD / Richardson Dopp, PA-C  Electrophysiologist:  None  Nephrologist: Dr. Moshe Cipro  Referring MD: Antonietta Jewel, MD   Chief Complaint:  Follow-up (CHF)    Patient Profile:    [name] is a 86 y.o. female with:   Chronic diastolic CHF ? Echocardiogram 08/2017: EF 60-65, Gr 2 DD, PASP 73, mild reduced RVSF  Chronic kidney disease  History of prior DVT  Hypertension  Hyperlipidemia  PAD ? LE angiogram 2002 >> L SFA occluded  Carotid artery disease  Bilateral ICA 60-79 (07/2019)  Followed by Dr. Trula Slade  Bradycardia ? Beta-blocker stopped  Prior CV studies:   Carotid US 08/13/2019 Bilateral ICA 60-79  Carotid US 07/05/2019 R 40-59; L 60-79 Bilateral subclavian artery stenosis R vertebral artery stenosis  Echocardiogram 09/21/2017 Moderate LVH, EF 60-65, normal wall motion, grade 2 diastolic dysfunction, mild to moderate MAC, mildly dilated RV, mildly reduced RVSF, mild RAE, PASP 73  Echocardiogram 01/02/2016 Moderate concentric LVH, EF 65-70, normal wall motion grade 3 diastolic dysfunction, calcified aortic valve, MAC, mild MR  LE Angiogram 11/2000 FINAL IMPRESSION: 1. Mild bilateral renal artery stenosis. 2. Left superficial femoral artery occlusion. 3. 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  Nuclear stress test 09/2000 IMPRESSION 1. NORMAL PERFUSION. NO ISCHEMIA. 2. EJECTION FRACTION IS 71%.   History of Present Illness:    [name] was last seen in October 2020.  She returns for 28-monthfollow-up.   She is here with her daughter.  Overall, she has been doing well.  She has not had any significant worsening of shortness of breath.  She has not had chest discomfort, syncope, orthopnea.  She has not had significant leg swelling.  Past Medical History:  Diagnosis Date    . Arthritis   . Chronic diastolic CH[facility] (congestive heart failure) ([facility]    a. Echo 01/02/16: Moderate concentric LVH, vigorous LV function, EF 65-70%, normal wall motion, grade 3 diastolic dysfunction, calcified aortic valve, MAC, mild MR, mild LAE  . CKD (chronic kidney disease)    Sees Dr. GMoshe Ciprowho took her off benicar in 2013.             .Marland KitchenHeart murmur    long time ago--no problems  . Hypercholesteremia   . Hypertensive heart and kidney disease with heart failure and with chronic kidney disease stage IV (HCC)     Current Medications: Current Meds  Medication Sig  . allopurinol (ZYLOPRIM) 100 MG tablet Take 100 mg by mouth 2 (two) times daily.  .Marland KitchenamLODipine (NORVASC) 10 MG tablet Take 10 mg by mouth daily.  .Marland Kitchenaspirin EC 81 MG tablet Take 81 mg by mouth daily.  . ferrous sulfate 325 (65 FE) MG tablet Take 325 mg by mouth every evening.   . hydrALAZINE (APRESOLINE) 100 MG tablet Take 100 mg by mouth 3 (three) times daily.   .Marland KitchenHYDROcodone-acetaminophen (NORCO) 10-325 MG tablet Take 1 tablet by mouth every 6 (six) hours as needed for moderate pain.   . hydrOXYzine (ATARAX/VISTARIL) 50 MG tablet as needed.  . hydrOXYzine (VISTARIL) 50 MG capsule at bedtime.   . isosorbide mononitrate (IMDUR) 120 MG 24 hr tablet Take 1 tablet (120 mg total) by mouth daily.  .Marland Kitchenlactulose (CHRONULAC) 10 GM/15ML solution as needed.  .Marland Kitchenlevothyroxine (SYNTHROID, LEVOTHROID) 75 MCG tablet Take 75 mcg by  mouth daily.  . Multiple Vitamin (MULTIVITAMIN WITH MINERALS) TABS tablet Take 1 tablet by mouth daily.  . rosuvastatin (CRESTOR) 10 MG tablet Take 10 mg by mouth every evening.  . torsemide (DEMADEX) 20 MG tablet Take 2 tablets (40 mg total) by mouth 2 (two) times daily.  . traZODone (DESYREL) 100 MG tablet TK 1 T PO QHS PRN.     Allergies:   Naprosyn [naproxen]   Social History   Tobacco Use  . Smoking status: Never Smoker  . Smokeless tobacco: Never Used  Substance Use Topics  . Alcohol use: Yes     Alcohol/week: 1.0 standard drinks    Types: 1 Standard drinks or equivalent per week    Comment: occasional  . Drug use: No     Family Hx: The patient's family history includes Diabetes Mellitus II in her daughter; Hypertension in her daughter and mother; Stroke in her father. There is no history of Heart attack.  ROS   EKGs/Labs/Other Test Reviewed:    EKG:  EKG is   ordered today.  The ekg ordered today demonstrates normal sinus rhythm, heart rate 69, atrial arrhythmia, normal axis, LVH, QTC 462  Recent Labs: No results found for requested labs within last 8760 hours.   Recent Lipid Panel No results found for: CHOL, TRIG, HDL, CHOLHDL, LDLCALC, LDLDIRECT  Physical Exam:    VS:  BP (!) 162/80   Pulse 69   Ht _0  (1.651 m)   Wt 221 lb (100.2 kg)   SpO2 95%   BMI 36.78 kg/m     Wt Readings from Last 3 Encounters:  01/02/20 221 lb (100.2 kg)  08/13/19 218 lb (98.9 kg)  06/20/19 218 lb (98.9 kg)     Constitutional:      Appearance: Healthy appearance. Not in distress.  Neck:     Thyroid: No thyromegaly.     Vascular: JVD normal.  Pulmonary:     Effort: Pulmonary effort is normal.     Breath sounds: No wheezing. No rales.  Cardiovascular:     Normal rate. Regular rhythm. Normal S1. Normal S2.     Murmurs: There is no murmur.  Edema:    Peripheral edema absent.  Abdominal:     Palpations: Abdomen is soft. There is no hepatomegaly.  Skin:    General: Skin is warm and dry.  Neurological:     Mental Status: Alert and oriented to person, place and time.     Cranial Nerves: Cranial nerves are intact.       ASSESSMENT & PLAN:    1. Chronic diastolic heart failure (HCC) NYHA IIb.  Volume status stable.  Continue current therapy.  2. Hypertensive heart and kidney disease with heart failure and with chronic kidney disease stage IV (HCC) Blood pressure remains above target.  She could not tolerate beta-blockers in the past secondary to bradycardia.  She is on  maximum dose Hydralazine.  She is also on a high dose of Isosorbide and on max dose Amlodipine.  We could consider Clonidine.  For now, I have asked her to monitor her BP at home and send me readings after a few weeks.  We discussed how to take her blood pressure appropriately at home with her feet flat on the floor, arm at heart level and no significant activity for at least 15 to 20 minutes prior to checking her blood pressure.  3. Chronic kidney disease, stage IV (severe) (HCC) Overall, her renal function has remained stable.  She  continues close follow-up with Dr. Moshe Cipro.  4. Bilateral carotid artery stenosis She has been evaluated by Dr. Trula Slade.  She will have continued follow-up with him for surveillance.  Continue aspirin, statin.   Dispo:  Return in about 6 months (around 07/04/2020) for Routine Follow Up, w/ Richardson Dopp, PA-C, in person.   Medication Adjustments/Labs and Tests Ordered: Current medicines are reviewed at length with the patient today.  Concerns regarding medicines are outlined above.  Tests Ordered: Orders Placed This Encounter  Procedures  . EKG 12-Lead   Medication Changes: No orders of the defined types were placed in this encounter.   Signed, Richardson Dopp, PA-C  01/02/2020 3:56 PM    [facility] [place], [place], [place]  [address] Phone: [phone]; Fax: [phone]

## 2020-02-16 ENCOUNTER — Encounter (HOSPITAL_COMMUNITY): Payer: Self-pay | Admitting: Emergency Medicine

## 2020-02-16 ENCOUNTER — Other Ambulatory Visit: Payer: Self-pay

## 2020-02-16 ENCOUNTER — Emergency Department (HOSPITAL_COMMUNITY): Payer: Medicare Other

## 2020-02-16 ENCOUNTER — Inpatient Hospital Stay (HOSPITAL_COMMUNITY)
Admission: AD | Admit: 2020-02-16 | Discharge: 2020-02-24 | DRG: 291 | Disposition: A | Discharge status: Home Health | Payer: Medicare Other | Attending: Family Medicine | Admitting: Family Medicine

## 2020-02-16 DIAGNOSIS — D509 Iron deficiency anemia, unspecified: Secondary | ICD-10-CM | POA: Diagnosis present

## 2020-02-16 DIAGNOSIS — N184 Chronic kidney disease, stage 4 (severe): Secondary | ICD-10-CM | POA: Diagnosis present

## 2020-02-16 DIAGNOSIS — I5082 Biventricular heart failure: Secondary | ICD-10-CM | POA: Diagnosis present

## 2020-02-16 DIAGNOSIS — I10 Essential (primary) hypertension: Secondary | ICD-10-CM | POA: Diagnosis present

## 2020-02-16 DIAGNOSIS — Z96653 Presence of artificial knee joint, bilateral: Secondary | ICD-10-CM | POA: Diagnosis present

## 2020-02-16 DIAGNOSIS — E039 Hypothyroidism, unspecified: Secondary | ICD-10-CM | POA: Diagnosis present

## 2020-02-16 DIAGNOSIS — Z79899 Other long term (current) drug therapy: Secondary | ICD-10-CM

## 2020-02-16 DIAGNOSIS — R001 Bradycardia, unspecified: Secondary | ICD-10-CM | POA: Diagnosis present

## 2020-02-16 DIAGNOSIS — I2510 Atherosclerotic heart disease of native coronary artery without angina pectoris: Secondary | ICD-10-CM | POA: Diagnosis present

## 2020-02-16 DIAGNOSIS — E7800 Pure hypercholesterolemia, unspecified: Secondary | ICD-10-CM | POA: Diagnosis present

## 2020-02-16 DIAGNOSIS — E785 Hyperlipidemia, unspecified: Secondary | ICD-10-CM | POA: Diagnosis present

## 2020-02-16 DIAGNOSIS — G8929 Other chronic pain: Secondary | ICD-10-CM | POA: Diagnosis present

## 2020-02-16 DIAGNOSIS — M109 Gout, unspecified: Secondary | ICD-10-CM | POA: Diagnosis present

## 2020-02-16 DIAGNOSIS — K5900 Constipation, unspecified: Secondary | ICD-10-CM | POA: Diagnosis present

## 2020-02-16 DIAGNOSIS — I421 Obstructive hypertrophic cardiomyopathy: Secondary | ICD-10-CM | POA: Diagnosis present

## 2020-02-16 DIAGNOSIS — E873 Alkalosis: Secondary | ICD-10-CM | POA: Diagnosis not present

## 2020-02-16 DIAGNOSIS — I2729 Other secondary pulmonary hypertension: Secondary | ICD-10-CM | POA: Diagnosis present

## 2020-02-16 DIAGNOSIS — D638 Anemia in other chronic diseases classified elsewhere: Secondary | ICD-10-CM | POA: Diagnosis present

## 2020-02-16 DIAGNOSIS — E669 Obesity, unspecified: Secondary | ICD-10-CM | POA: Diagnosis present

## 2020-02-16 DIAGNOSIS — Z7982 Long term (current) use of aspirin: Secondary | ICD-10-CM

## 2020-02-16 DIAGNOSIS — I5033 Acute on chronic diastolic (congestive) heart failure: Secondary | ICD-10-CM | POA: Diagnosis present

## 2020-02-16 DIAGNOSIS — Z79890 Hormone replacement therapy (postmenopausal): Secondary | ICD-10-CM | POA: Diagnosis not present

## 2020-02-16 DIAGNOSIS — J9601 Acute respiratory failure with hypoxia: Secondary | ICD-10-CM | POA: Diagnosis present

## 2020-02-16 DIAGNOSIS — F329 Major depressive disorder, single episode, unspecified: Secondary | ICD-10-CM | POA: Diagnosis present

## 2020-02-16 DIAGNOSIS — Z9111 Patient's noncompliance with dietary regimen: Secondary | ICD-10-CM

## 2020-02-16 DIAGNOSIS — I130 Hypertensive heart and chronic kidney disease with heart failure and stage 1 through stage 4 chronic kidney disease, or unspecified chronic kidney disease: Secondary | ICD-10-CM | POA: Diagnosis present

## 2020-02-16 DIAGNOSIS — N179 Acute kidney failure, unspecified: Secondary | ICD-10-CM

## 2020-02-16 DIAGNOSIS — Z86718 Personal history of other venous thrombosis and embolism: Secondary | ICD-10-CM

## 2020-02-16 DIAGNOSIS — D649 Anemia, unspecified: Secondary | ICD-10-CM

## 2020-02-16 DIAGNOSIS — I5043 Acute on chronic combined systolic (congestive) and diastolic (congestive) heart failure: Secondary | ICD-10-CM | POA: Diagnosis not present

## 2020-02-16 DIAGNOSIS — Z6837 Body mass index (BMI) 37.0-37.9, adult: Secondary | ICD-10-CM

## 2020-02-16 LAB — CBC
HCT: 29.4 % — ABNORMAL LOW (ref 36.0–46.0)
Hemoglobin: 8.7 g/dL — ABNORMAL LOW (ref 12.0–15.0)
MCH: 25.6 pg — ABNORMAL LOW (ref 26.0–34.0)
MCHC: 29.6 g/dL — ABNORMAL LOW (ref 30.0–36.0)
MCV: 86.5 fL (ref 80.0–100.0)
Platelets: 171 10*3/uL (ref 150–400)
RBC: 3.4 MIL/uL — ABNORMAL LOW (ref 3.87–5.11)
RDW: 20.5 % — ABNORMAL HIGH (ref 11.5–15.5)
WBC: 6.2 10*3/uL (ref 4.0–10.5)
nRBC: 0 % (ref 0.0–0.2)

## 2020-02-16 LAB — COMPREHENSIVE METABOLIC PANEL
ALT: 15 U/L (ref 0–44)
AST: 18 U/L (ref 15–41)
Albumin: 3.4 g/dL — ABNORMAL LOW (ref 3.5–5.0)
Alkaline Phosphatase: 61 U/L (ref 38–126)
Anion gap: 13 (ref 5–15)
BUN: 55 mg/dL — ABNORMAL HIGH (ref 8–23)
CO2: 29 mmol/L (ref 22–32)
Calcium: 8.4 mg/dL — ABNORMAL LOW (ref 8.9–10.3)
Chloride: 99 mmol/L (ref 98–111)
Creatinine, Ser: 2.85 mg/dL — ABNORMAL HIGH (ref 0.44–1.00)
GFR calc Af Amer: 16 mL/min — ABNORMAL LOW (ref >60)
GFR calc non Af Amer: 14 mL/min — ABNORMAL LOW (ref >60)
Glucose, Bld: 107 mg/dL — ABNORMAL HIGH (ref 70–99)
Potassium: 3.9 mmol/L (ref 3.5–5.1)
Sodium: 141 mmol/L (ref 135–145)
Total Bilirubin: 0.6 mg/dL (ref 0.3–1.2)
Total Protein: 6.4 g/dL — ABNORMAL LOW (ref 6.5–8.1)

## 2020-02-16 LAB — POC OCCULT BLOOD, ED: Fecal Occult Bld: NEGATIVE

## 2020-02-16 LAB — TROPONIN I (HIGH SENSITIVITY)
Troponin I (High Sensitivity): 24 ng/L — ABNORMAL HIGH (ref <18)
Troponin I (High Sensitivity): 25 ng/L — ABNORMAL HIGH (ref <18)
Troponin I (High Sensitivity): 26 ng/L — ABNORMAL HIGH (ref <18)

## 2020-02-16 LAB — BRAIN NATRIURETIC PEPTIDE: B Natriuretic Peptide: 1863 pg/mL — ABNORMAL HIGH (ref 0.0–100.0)

## 2020-02-16 MED ORDER — LACTULOSE 10 GM/15ML PO SOLN
10.0000 g | Freq: Every day | ORAL | Status: DC | PRN
  Administered 2020-02-24: 10 g via ORAL
  Filled 2020-02-16: qty 15

## 2020-02-16 MED ORDER — HYDRALAZINE HCL 50 MG PO TABS
100.0000 mg | ORAL_TABLET | Freq: Three times a day (TID) | ORAL | Status: DC
  Administered 2020-02-16 – 2020-02-19 (×9): 100 mg via ORAL
  Filled 2020-02-16 (×9): qty 2

## 2020-02-16 MED ORDER — HEPARIN SODIUM (PORCINE) 5000 UNIT/ML IJ SOLN
5000.0000 [IU] | Freq: Three times a day (TID) | INTRAMUSCULAR | Status: DC
  Administered 2020-02-16 – 2020-02-24 (×23): 5000 [IU] via SUBCUTANEOUS
  Filled 2020-02-16 (×22): qty 1

## 2020-02-16 MED ORDER — FERROUS SULFATE 325 (65 FE) MG PO TABS
325.0000 mg | ORAL_TABLET | Freq: Every day | ORAL | Status: DC
  Administered 2020-02-17 – 2020-02-24 (×8): 325 mg via ORAL
  Filled 2020-02-16 (×8): qty 1

## 2020-02-16 MED ORDER — ONDANSETRON HCL 4 MG/2ML IJ SOLN: 4.0000 mg | Freq: Four times a day (QID) | INTRAMUSCULAR | Status: DC | PRN

## 2020-02-16 MED ORDER — SODIUM CHLORIDE 0.9% FLUSH
3.0000 mL | Freq: Two times a day (BID) | INTRAVENOUS | Status: DC
  Administered 2020-02-16 – 2020-02-23 (×10): 3 mL via INTRAVENOUS

## 2020-02-16 MED ORDER — ROSUVASTATIN CALCIUM 10 MG PO TABS
10.0000 mg | ORAL_TABLET | Freq: Every evening | ORAL | Status: DC
  Administered 2020-02-16 – 2020-02-23 (×8): 10 mg via ORAL
  Filled 2020-02-16 (×8): qty 1

## 2020-02-16 MED ORDER — SODIUM CHLORIDE 0.9% FLUSH: 3.0000 mL | INTRAVENOUS | Status: DC | PRN

## 2020-02-16 MED ORDER — ACETAMINOPHEN 325 MG PO TABS
650.0000 mg | ORAL_TABLET | ORAL | Status: DC | PRN
  Administered 2020-02-18: 650 mg via ORAL
  Filled 2020-02-16 (×3): qty 2

## 2020-02-16 MED ORDER — ALLOPURINOL 100 MG PO TABS
100.0000 mg | ORAL_TABLET | Freq: Two times a day (BID) | ORAL | Status: DC
  Administered 2020-02-16 – 2020-02-21 (×10): 100 mg via ORAL
  Filled 2020-02-16 (×10): qty 1

## 2020-02-16 MED ORDER — FUROSEMIDE 10 MG/ML IJ SOLN
40.0000 mg | Freq: Once | INTRAMUSCULAR | Status: AC
  Administered 2020-02-16: 40 mg via INTRAVENOUS
  Filled 2020-02-16: qty 4

## 2020-02-16 MED ORDER — FUROSEMIDE 10 MG/ML IJ SOLN
60.0000 mg | Freq: Once | INTRAMUSCULAR | Status: AC
  Administered 2020-02-16: 60 mg via INTRAVENOUS
  Filled 2020-02-16: qty 6

## 2020-02-16 MED ORDER — LEVOTHYROXINE SODIUM 50 MCG PO TABS
75.0000 ug | ORAL_TABLET | Freq: Every day | ORAL | Status: DC
  Administered 2020-02-17 – 2020-02-24 (×8): 75 ug via ORAL
  Filled 2020-02-16 (×8): qty 1

## 2020-02-16 MED ORDER — ISOSORBIDE MONONITRATE ER 60 MG PO TB24
120.0000 mg | ORAL_TABLET | Freq: Every day | ORAL | Status: DC
  Administered 2020-02-17 – 2020-02-24 (×8): 120 mg via ORAL
  Filled 2020-02-16 (×8): qty 2

## 2020-02-16 MED ORDER — SODIUM CHLORIDE 0.9 % IV SOLN
250.0000 mL | INTRAVENOUS | Status: DC | PRN
  Administered 2020-02-22: 250 mL via INTRAVENOUS

## 2020-02-16 MED ORDER — ASPIRIN EC 81 MG PO TBEC
81.0000 mg | DELAYED_RELEASE_TABLET | Freq: Every day | ORAL | Status: DC
  Administered 2020-02-17: 81 mg via ORAL
  Filled 2020-02-16: qty 1

## 2020-02-16 NOTE — ED Notes (Signed)
XR at bedside

## 2020-02-16 NOTE — ED Provider Notes (Addendum)
[facility] DEPT Provider Note   CSN: [id number] Arrival date & time: 02/16/20  1416     History Chief Complaint  Patient presents with  . Shortness of Breath    [name] is a 86 y.o. female.  Patient with hx chf, presents via EMS with c/o sob for past few days. Symptoms gradual onset, moderate, slowly worse, worse w exertion, lying flat. +increased bil leg edema. States compliant w home meds, making normal amount of urine. Denies cough or uri symptoms. No fever or chills. Denies chest pain. No pleuritic pain.   The history is provided by the patient and the EMS personnel.  Shortness of Breath Associated symptoms: no abdominal pain, no chest pain, no cough, no fever, no headaches, no neck pain, no rash, no sore throat and no vomiting        Past Medical History:  Diagnosis Date  . Arthritis   . Chronic diastolic CHF (congestive heart failure) ([place])    a. Echo 01/02/16: Moderate concentric LVH, vigorous LV function, EF 65-70%, normal wall motion, grade 3 diastolic dysfunction, calcified aortic valve, MAC, mild MR, mild LAE  . CKD (chronic kidney disease)    Sees Dr. Moshe Cipro who took her off benicar in 2013.             Marland Kitchen Heart murmur    long time ago--no problems  . Hypercholesteremia   . Hypertensive heart and kidney disease with heart failure and with chronic kidney disease stage IV [facility])     Patient Active Problem List   Diagnosis Date Noted  . Hypertension 01/03/2018  . Atypical chest pain 01/03/2018  . Sinus bradycardia 01/03/2018  . Hypertensive heart and kidney disease with heart failure and with chronic kidney disease stage IV ([place])   . Fever, unspecified 01/06/2016  . Acute respiratory failure with hypoxia ([place]) 01/01/2016  . Chronic diastolic heart failure ([place]) 01/01/2016  . History of DVT (deep vein thrombosis) 01/01/2016  . Chronic kidney disease, stage IV (severe) ([place]) 01/01/2016  . Anemia of renal disease  01/01/2016  . Iron deficiency anemia 01/01/2016  . Hyperkalemia 11/21/2014  . Diarrhea 11/21/2014  . Metabolic acidosis 87/68/1157  . Renal failure (ARF), acute on chronic ([place]) 11/21/2014    Past Surgical History:  Procedure Laterality Date  . CATARACT EXTRACTION W/PHACO Right 03/20/2014   Procedure: CATARACT EXTRACTION PHACO AND INTRAOCULAR LENS PLACEMENT (IOC);  Surgeon: [name], MD;  Location: [facility];  Service: Ophthalmology;  Laterality: Right;  . failed knee surgery     on the right x 2  . JOINT REPLACEMENT     both knees  . left ankle orif       OB History   No obstetric history on file.     Family History  Problem Relation Age of Onset  . Hypertension Mother   . Stroke Father   . Diabetes Mellitus II Daughter   . Hypertension Daughter   . Heart attack Neg Hx     Social History   Tobacco Use  . Smoking status: Never Smoker  . Smokeless tobacco: Never Used  Vaping Use  . Vaping Use: Never used  Substance Use Topics  . Alcohol use: Yes    Alcohol/week: 1.0 standard drink    Types: 1 Standard drinks or equivalent per week    Comment: occasional  . Drug use: No    Home Medications Prior to Admission medications   Medication Sig Start Date End Date Taking? Authorizing Provider  allopurinol (ZYLOPRIM) 100 MG tablet Take 100 mg by mouth 2 (two) times daily. 12/24/15   [provider]  amLODipine (NORVASC) 10 MG tablet Take 10 mg by mouth daily. 09/11/19   [provider]  aspirin EC 81 MG tablet Take 81 mg by mouth daily.    [provider]  ENULOSE 10 GM/15ML SOLN Take 10 g by mouth daily. 01/14/20   [provider]  ferrous sulfate 325 (65 FE) MG tablet Take 325 mg by mouth every evening.     [provider]  hydrALAZINE (APRESOLINE) 100 MG tablet Take 100 mg by mouth 3 (three) times daily.     [provider]  HYDROcodone-acetaminophen (NORCO) 10-325 MG tablet Take 1 tablet by mouth every 6 (six) hours  as needed for moderate pain.     [provider]  hydrOXYzine (VISTARIL) 50 MG capsule Take 50 mg by mouth at bedtime.  07/19/19   [provider]  isosorbide mononitrate (IMDUR) 120 MG 24 hr tablet Take 1 tablet (120 mg total) by mouth daily. 11/07/19 11/06/20  Nahser, Wonda Cheng, MD  lactulose (CHRONULAC) 10 GM/15ML solution Take 10 g by mouth as needed for mild constipation.  12/08/19   [provider]  levothyroxine (SYNTHROID, LEVOTHROID) 75 MCG tablet Take 75 mcg by mouth daily. 06/08/18   [provider]  Multiple Vitamin (MULTIVITAMIN WITH MINERALS) TABS tablet Take 1 tablet by mouth daily.    [provider]  rosuvastatin (CRESTOR) 10 MG tablet Take 10 mg by mouth every evening.    [provider]  torsemide (DEMADEX) 20 MG tablet Take 2 tablets (40 mg total) by mouth 2 (two) times daily. 11/13/19   Richardson Dopp T, PA-C  traZODone (DESYREL) 100 MG tablet Take 100 mg by mouth at bedtime as needed for sleep.  11/23/18   [provider]    Allergies    Naprosyn [naproxen]  Review of Systems   Review of Systems  Constitutional: Negative for chills and fever.  HENT: Negative for sore throat.   Eyes: Negative for redness.  Respiratory: Positive for shortness of breath. Negative for cough.   Cardiovascular: Positive for leg swelling. Negative for chest pain and palpitations.  Gastrointestinal: Negative for abdominal pain and vomiting.  Genitourinary: Negative for dysuria and flank pain.  Musculoskeletal: Negative for back pain and neck pain.  Skin: Negative for rash.  Neurological: Negative for headaches.  Hematological: Does not bruise/bleed easily.  Psychiatric/Behavioral: Negative for confusion.    Physical Exam Updated Vital Signs BP (!) 155/90   Pulse 60   Temp 98.3 F (36.8 C) (Oral)   Resp 15   SpO2 100%   Physical Exam Vitals and nursing note reviewed.  Constitutional:      Appearance: Normal appearance. She  is well-developed.  HENT:     Head: Atraumatic.     Nose: Nose normal.     Mouth/Throat:     Mouth: Mucous membranes are moist.  Eyes:     General: No scleral icterus.    Conjunctiva/sclera: Conjunctivae normal.  Neck:     Trachea: No tracheal deviation.  Cardiovascular:     Rate and Rhythm: Normal rate and regular rhythm.     Pulses: Normal pulses.     Heart sounds: Normal heart sounds. No murmur heard.  No friction rub. No gallop.   Pulmonary:     Effort: Pulmonary effort is normal. No respiratory distress.     Breath sounds: Rales present.  Comments: Rales, bases.  Abdominal:     General: Bowel sounds are normal. There is no distension.     Palpations: Abdomen is soft.     Tenderness: There is no abdominal tenderness. There is no guarding.  Genitourinary:    Comments: No cva tenderness.  Musculoskeletal:        General: Swelling present.     Cervical back: Normal range of motion and neck supple. No rigidity. No muscular tenderness.     Comments: Moderate, symmetric swelling to bil feet and legs extending to mid thigh.   Skin:    General: Skin is warm and dry.     Findings: No rash.  Neurological:     Mental Status: She is alert.     Comments: Alert, speech normal.   Psychiatric:        Mood and Affect: Mood normal.     ED Results / Procedures / Treatments   Labs (all labs ordered are listed, but only abnormal results are displayed) Results for orders placed or performed during the hospital encounter of 02/16/20  CBC  Result Value Ref Range   WBC 6.2 4.0 - 10.5 K/uL   RBC 3.40 (L) 3.87 - 5.11 MIL/uL   Hemoglobin 8.7 (L) 12.0 - 15.0 g/dL   HCT 29.4 (L) 36 - 46 %   MCV 86.5 80.0 - 100.0 fL   MCH 25.6 (L) 26.0 - 34.0 pg   MCHC 29.6 (L) 30.0 - 36.0 g/dL   RDW 20.5 (H) 11.5 - 15.5 %   Platelets 171 150 - 400 K/uL   nRBC 0.0 0.0 - 0.2 %  Comprehensive metabolic panel  Result Value Ref Range   Sodium 141 135 - 145 mmol/L   Potassium 3.9 3.5 - 5.1 mmol/L    Chloride 99 98 - 111 mmol/L   CO2 29 22 - 32 mmol/L   Glucose, Bld 107 (H) 70 - 99 mg/dL   BUN 55 (H) 8 - 23 mg/dL   Creatinine, Ser 2.85 (H) 0.44 - 1.00 mg/dL   Calcium 8.4 (L) 8.9 - 10.3 mg/dL   Total Protein 6.4 (L) 6.5 - 8.1 g/dL   Albumin 3.4 (L) 3.5 - 5.0 g/dL   AST 18 15 - 41 U/L   ALT 15 0 - 44 U/L   Alkaline Phosphatase 61 38 - 126 U/L   Total Bilirubin 0.6 0.3 - 1.2 mg/dL   GFR calc non Af Amer 14 (L) >60 mL/min   GFR calc Af Amer 16 (L) >60 mL/min   Anion gap 13 5 - 15  Brain natriuretic peptide  Result Value Ref Range   B Natriuretic Peptide 1,863.0 (H) 0.0 - 100.0 pg/mL  Troponin I (High Sensitivity)  Result Value Ref Range   Troponin I (High Sensitivity) 24 (H) <18 ng/L   DG Chest Port 1 View  Result Date: 02/16/2020 CLINICAL DATA:  Shortness of breath, lower extremity edema EXAM: PORTABLE CHEST 1 VIEW COMPARISON:  05/02/2018 FINDINGS: Stable cardiomegaly. Atherosclerotic calcification of the aortic knob. Pulmonary vascular congestion with mild diffuse interstitial prominence. Small nodular densities within the bilateral perihilar regions are unchanged in appearance from 01/17/2017. Probable small right pleural effusion. No pneumothorax. IMPRESSION: Findings suggesting CHF with mild interstitial edema. Electronically Signed   By: [name] D.O.   On: 02/16/2020 15:37    EKG EKG Interpretation  Date/Time:  Saturday February 16 2020 14:53:32 EDT Ventricular Rate:  59 PR Interval:    QRS Duration: 81 QT Interval:  497 QTC  Calculation: 493 R Axis:   92 Text Interpretation: Sinus rhythm Atrial premature complex Right axis deviation Consider left ventricular hypertrophy Borderline prolonged QT interval Nonspecific T wave abnormality Confirmed by Lajean Saver [phone]) on 02/16/2020 3:57:32 PM   Radiology DG Chest Port 1 View  Result Date: 02/16/2020 CLINICAL DATA:  Shortness of breath, lower extremity edema EXAM: PORTABLE CHEST 1 VIEW COMPARISON:  05/02/2018  FINDINGS: Stable cardiomegaly. Atherosclerotic calcification of the aortic knob. Pulmonary vascular congestion with mild diffuse interstitial prominence. Small nodular densities within the bilateral perihilar regions are unchanged in appearance from 01/17/2017. Probable small right pleural effusion. No pneumothorax. IMPRESSION: Findings suggesting CHF with mild interstitial edema. Electronically Signed   By: [name] D.O.   On: 02/16/2020 15:37    Procedures Procedures (including critical care time)  Medications Ordered in ED Medications - No data to display  ED Course  I have reviewed the triage vital signs and the nursing notes.  Pertinent labs & imaging results that were available during my care of the patient were reviewed by me and considered in my medical decision making (see chart for details).    MDM Rules/Calculators/A&P                          Iv ns. Continuous pulse ox and monitor. Stat labs. Pcxr. Ecg. o2 dat 88% room air. In mid 90s on 2 liters [place]. Pt normally does not use home o2.   MDM Number of Diagnoses or Management Options   Amount and/or Complexity of Data Reviewed Clinical lab tests: ordered and reviewed Tests in the radiology section of CPT: ordered and reviewed Tests in the medicine section of CPT: ordered and reviewed Discussion of test results with the performing providers: yes Decide to obtain previous medical records or to obtain history from someone other than the patient: yes Obtain history from someone other than the patient: yes Review and summarize past medical records: yes Discuss the patient with other providers: yes Independent visualization of images, tracings, or specimens: yes  Risk of Complications, Morbidity, and/or Mortality Presenting problems: high Diagnostic procedures: high Management options: high   Reviewed nursing notes and prior charts for additional history.   CXR reviewed/interpreted me -  Vascular congestion/chf.  Lasix 40 mg iv.   Labs reviewed/interpreted by me - hct lower than prior a couple years ago. Anemia panel added to labs. Hemoccult stool. Pt denies blood loss, rectal bleeding or melena.   Additional labs reviewed/interpreted by me - trop sl elev, ?related to CKD and chf, no current chest pain or discomfort. BNP markedly elevated.   Hospitalists consulted for admission.      Final Clinical Impression(s) / ED Diagnoses Final diagnoses:  None    Rx / DC Orders ED Discharge Orders    None           Lajean Saver, MD 02/16/20 1624

## 2020-02-16 NOTE — ED Notes (Signed)
Family at bedside. 

## 2020-02-16 NOTE — ED Notes (Signed)
Hospitalist at bedside 

## 2020-02-16 NOTE — ED Triage Notes (Signed)
Per EMS, patient from home, c/o SOB x2 weeks worsening last night. +2 pitting edema to lower extremities, pain with palpation. Hx CHF. Denies chest pain. 4L [place] with EMS.  0.4 SL nitro with improvement

## 2020-02-16 NOTE — ED Notes (Signed)
Per Dr. Francesco Sor, administer 60mg  Lasix IV at 2200.

## 2020-02-16 NOTE — ED Notes (Signed)
Per Dr. Francesco Sor, adminster hydralazine at this time for hypertension.

## 2020-02-16 NOTE — H&P (Signed)
History and Physical    [name] XFG:[id number] DOB: [birth year] DOA: 02/16/2020  PCP: Antonietta Jewel, MD  Patient coming from: home  I have personally briefly reviewed patient's old medical records in [place]  Chief Complaint: shortness of breath, LE edema  HPI: [name] is a 86 y.o. female with a pertinent history of heart failure, CKD 4, murmur, iron deficiency anemia who presents with shortness of breath to [facility] long emergency department.   Over the last 2-3 week patient has become progressively more short of breath with lower extremity edema, associated symptoms include dyspnea on exertion and orthopnea.  She endorses compliance with all her medicines.  [name], daughter, in the room corroborates the story and states that the pt's sister who has not been doin gwell from cancer and recently transferred to hospice.  Family has been bringing meals, presumably high in sodium.  Denies any chest pain currently.  Emergency department physician saw patient desat to 87% on room air and require 1 to 2 L oxygen to improve to the low 90s.  Otherwise hemodynamically stable.  WBC 6.2, chronic anemia with a hemoglobin of 8.7, 2.85 serum creatinine, 1800 BNP up from prior. EKG shows sinus bradycardia with PAC, left ventricular hypertrophy and T wave inversions in V2 which is different from prior but I question lead placement.  Repeat Chest x-ray showed findings suggestive of heart failure with mild interstitial edema.  Will admit to medicine with telemetry    Review of Systems: As per HPI otherwise 10 point review of systems negative.  Other pertinents as below:  General - has had weight gain, denies HA's or visual changes HEENT - denies new dysphagia Cardio - denies palpitations, but has orthopnea  Resp - denies cough or recent infection GI - denies n/v/d/GI pain GU - denies urinary changes MSK - legs feel like lead and she hasn't been walking as much as usual.  No falls  recently. Skin - denies any new skin changes Neuro - denies any new numbness or weakness Psych - denies new anxiety or depression   Past Medical History:  Diagnosis Date  . Arthritis   . Chronic diastolic CHF (congestive heart failure) ([place])    a. Echo 01/02/16: Moderate concentric LVH, vigorous LV function, EF 65-70%, normal wall motion, grade 3 diastolic dysfunction, calcified aortic valve, MAC, mild MR, mild LAE  . CKD (chronic kidney disease)    Sees Dr. Moshe Cipro who took her off benicar in 2013.             Marland Kitchen Heart murmur    long time ago--no problems  . Hypercholesteremia   . Hypertensive heart and kidney disease with heart failure and with chronic kidney disease stage IV [facility])     Past Surgical History:  Procedure Laterality Date  . CATARACT EXTRACTION W/PHACO Right 03/20/2014   Procedure: CATARACT EXTRACTION PHACO AND INTRAOCULAR LENS PLACEMENT (IOC);  Surgeon: [name], MD;  Location: [facility];  Service: Ophthalmology;  Laterality: Right;  . failed knee surgery     on the right x 2  . JOINT REPLACEMENT     both knees  . left ankle orif       reports that she has never smoked. She has never used smokeless tobacco. She reports current alcohol use of about 1.0 standard drink of alcohol per week. She reports that she does not use drugs.  Allergies  Allergen Reactions  . Naprosyn [Naproxen] Other (See Comments)    Hot burning feeling  Family History  Problem Relation Age of Onset  . Hypertension Mother   . Stroke Father   . Diabetes Mellitus II Daughter   . Hypertension Daughter   . Heart attack Neg Hx     Prior to Admission medications   Medication Sig Start Date End Date Taking? Authorizing Provider  allopurinol (ZYLOPRIM) 100 MG tablet Take 100 mg by mouth 2 (two) times daily. 12/24/15  Yes [provider]  aspirin EC 81 MG tablet Take 81 mg by mouth daily.   Yes [provider]  ferrous sulfate 325 (65 FE) MG tablet Take 325 mg by mouth  in the morning and at bedtime.    Yes [provider]  hydrALAZINE (APRESOLINE) 100 MG tablet Take 100 mg by mouth 3 (three) times daily.    Yes [provider]  HYDROcodone-acetaminophen (NORCO) 10-325 MG tablet Take 1 tablet by mouth every 4 (four) hours as needed for moderate pain.    Yes [provider]  hydrOXYzine (VISTARIL) 50 MG capsule Take 50 mg by mouth at bedtime.  07/19/19  Yes [provider]  isosorbide mononitrate (IMDUR) 120 MG 24 hr tablet Take 1 tablet (120 mg total) by mouth daily. 11/07/19 11/06/20 Yes Nahser, Wonda Cheng, MD  lactulose [facility]) 10 GM/15ML solution Take 10 g by mouth daily as needed for mild constipation.   Yes [provider]  levothyroxine (SYNTHROID, LEVOTHROID) 75 MCG tablet Take 75 mcg by mouth daily. 06/08/18  Yes [provider]  Multiple Vitamin (MULTIVITAMIN WITH MINERALS) TABS tablet Take 1 tablet by mouth daily.   Yes [provider]  rosuvastatin (CRESTOR) 10 MG tablet Take 10 mg by mouth every evening.   Yes [provider]  torsemide (DEMADEX) 20 MG tablet Take 2 tablets (40 mg total) by mouth 2 (two) times daily. 11/13/19  Yes Richardson Dopp T, PA-C    Physical Exam: Vitals:   02/16/20 1530 02/16/20 1600 02/16/20 1700 02/16/20 1800  BP: (!) 162/68 (!) 173/64 (!) 187/63 (!) 181/61  Pulse: (!) 59 (!) 58 61 60  Resp: _0 Temp:      TempSrc:      SpO2: 100% 100% 100% 100%    Constitutional: NAD, comfortable Eyes: pupils equal and reactive to light, anicteric, without injection ENMT: MMM, throat without exudates or erythema, JVD to the middle ear Neck: normal, supple, no masses, no thyromegaly noted Respiratory: mildly increased work of breathing, some bibasilar rales Cardiovascular: rrr w/ a murmur Abdomen: NBS, NT,  obese Musculoskeletal: moving all 4 extremities, appears weak Skin: no rashes, lesions, ulcers. No induration Neurologic: CN 2-12 grossly intact.  Sensation intact Psychiatric: AO appearing, mentation appropriate  Labs on Admission: I have personally reviewed following labs and imaging studies  CBC: Recent Labs  Lab 02/16/20 1450  WBC 6.2  HGB 8.7*  HCT 29.4*  MCV 86.5  PLT 174   Basic Metabolic Panel: Recent Labs  Lab 02/16/20 1450  NA 141  K 3.9  CL 99  CO2 29  GLUCOSE 107*  BUN 55*  CREATININE 2.85*  CALCIUM 8.4*   GFR: CrCl cannot be calculated (Unknown ideal weight.). Liver Function Tests: Recent Labs  Lab 02/16/20 1450  AST 18  ALT 15  ALKPHOS 61  BILITOT 0.6  PROT 6.4*  ALBUMIN 3.4*   No results for input(s): LIPASE, AMYLASE in the last 168 hours. No results for input(s): AMMONIA in the last 168 hours. Coagulation Profile: No results for input(s): INR, PROTIME  in the last 168 hours. Cardiac Enzymes: No results for input(s): CKTOTAL, CKMB, CKMBINDEX, TROPONINI in the last 168 hours. BNP (last 3 results) No results for input(s): PROBNP in the last 8760 hours. HbA1C: No results for input(s): HGBA1C in the last 72 hours. CBG: No results for input(s): GLUCAP in the last 168 hours. Lipid Profile: No results for input(s): CHOL, HDL, LDLCALC, TRIG, CHOLHDL, LDLDIRECT in the last 72 hours. Thyroid Function Tests: No results for input(s): TSH, T4TOTAL, FREET4, T3FREE, THYROIDAB in the last 72 hours. Anemia Panel: No results for input(s): VITAMINB12, FOLATE, FERRITIN, TIBC, IRON, RETICCTPCT in the last 72 hours. Urine analysis:    Component Value Date/Time   COLORURINE YELLOW 02/17/2016 [place] 02/17/2016 1821   LABSPEC 1.010 02/17/2016 1821   PHURINE 6.0 02/17/2016 1821   GLUCOSEU NEGATIVE 02/17/2016 1821   HGBUR NEGATIVE 02/17/2016 1821   BILIRUBINUR NEGATIVE 02/17/2016 1821   KETONESUR NEGATIVE 02/17/2016 1821   PROTEINUR NEGATIVE 02/17/2016 1821   UROBILINOGEN 0.2 11/21/2014 2230   NITRITE NEGATIVE 02/17/2016 1821   LEUKOCYTESUR NEGATIVE 02/17/2016 1821     Radiological Exams on Admission: DG Chest Port 1 View  Result Date: 02/16/2020 CLINICAL DATA:  Shortness of breath, lower extremity edema EXAM: PORTABLE CHEST 1 VIEW COMPARISON:  05/02/2018 FINDINGS: Stable cardiomegaly. Atherosclerotic calcification of the aortic knob. Pulmonary vascular congestion with mild diffuse interstitial prominence. Small nodular densities within the bilateral perihilar regions are unchanged in appearance from 01/17/2017. Probable small right pleural effusion. No pneumothorax. IMPRESSION: Findings suggesting CHF with mild interstitial edema. Electronically Signed   By: [name] D.O.   On: 02/16/2020 15:37    EKG: Independently reviewed. v2 TWI's, question if accurate lead placement  Assessment/Plan Active Problems:   Acute on chronic diastolic heart failure (HCC)   [name] is a 86 y.o. female with a pertinent history of heart failure, CKD 4, murmur, iron deficiency anemia who presents with shortness of breath to [facility] long emergency department found to have HF exacerbation.  #Dyspnea of exertion likely from heart failure with preserved ejection fraction, continue to watch the patient's heart rate but I doubt bradycardia is the etiology, with a component of possible iron deficiency #Suspect HFpEF exacerbation EF 60 to 65% with pseudonormal filling patterns in 2019, left ventricular hypertrophy on EKG #History of right ventricular heart failure in 2019 with mildly reduced systolic function, will check again on echo --Repeat echo, monitor patient's vitals -Daily weights -Fluid restrictions --had decent output surprisingly with Lasix IV 13m -A Lasix 60 mg IV at 8 pm and see how that does for her till the morning. -Keep on telemetry, potassium and magnesium --discouraged salt in her foods which seems to be the cu lprit. --follow up trop, monitor for chest pain  Anemia, history of iron deficiency, please follow-up on iron levels and consider iron  supplementation.  Chronic conditions -CKD 4-renally dose, avoid NSAIDs, avoid contrast if possible --renal diet with fluid restrictions  Sinus bradycardia, continue to monitor  CAD - denies history Hypertension-continue hydralazine 100 mg 3 times daily, Imdur Gout-continue allopurinol Hyperlipidemia-continue aspirin 81 mg daily, continue Crestor 10 mg Chronic pain Hypothyroidism-continue levothyroxine 75 mcg Constipation-outpatient uses lactulose as needed we will continue here on an as-needed basis Iron deficiency-continues iron supplementation, new evidence for HF suggests doing iron repletion if low.    Patient and/or Family completely agreed with the plan, expressed understanding and I answered all questions.  DVT prophylaxis: Heparin SQ Code Status: Full code GMadaline Savageto make  decisions for patient if she is not able.  who is an Therapist, sports, but doesn't sound to be POA Family Communication: [name], daughter, in room Disposition Plan: hopin gshe doesn't deconditopin while here and can use PT and getting fluid off of her to get her moving.  Advised compression stockings Consults called: n/a Admission status: inpatient because I s uspect will take 3 days to get euvolemic and inpatient because a HF exacerbation with hypoxic respiratory failure.   A total of 72 minutes utilized during this admission.  [place]   If 7PM-7AM, please contact night-coverage [email or web address] Password [email or web address]  02/16/2020, 7:08 PM

## 2020-02-16 NOTE — ED Notes (Signed)
Patient given meal tray.

## 2020-02-16 NOTE — ED Notes (Signed)
ED Provider at bedside. 

## 2020-02-17 ENCOUNTER — Inpatient Hospital Stay (HOSPITAL_COMMUNITY): Payer: Medicare Other

## 2020-02-17 DIAGNOSIS — I5043 Acute on chronic combined systolic (congestive) and diastolic (congestive) heart failure: Secondary | ICD-10-CM

## 2020-02-17 DIAGNOSIS — I5033 Acute on chronic diastolic (congestive) heart failure: Secondary | ICD-10-CM

## 2020-02-17 LAB — MAGNESIUM: Magnesium: 2.1 mg/dL (ref 1.7–2.4)

## 2020-02-17 LAB — CBC
HCT: 27.1 % — ABNORMAL LOW (ref 36.0–46.0)
Hemoglobin: 8 g/dL — ABNORMAL LOW (ref 12.0–15.0)
MCH: 25.2 pg — ABNORMAL LOW (ref 26.0–34.0)
MCHC: 29.5 g/dL — ABNORMAL LOW (ref 30.0–36.0)
MCV: 85.5 fL (ref 80.0–100.0)
Platelets: 149 10*3/uL — ABNORMAL LOW (ref 150–400)
RBC: 3.17 MIL/uL — ABNORMAL LOW (ref 3.87–5.11)
RDW: 20.6 % — ABNORMAL HIGH (ref 11.5–15.5)
WBC: 5.1 10*3/uL (ref 4.0–10.5)
nRBC: 0 % (ref 0.0–0.2)

## 2020-02-17 LAB — BASIC METABOLIC PANEL
Anion gap: 14 (ref 5–15)
BUN: 55 mg/dL — ABNORMAL HIGH (ref 8–23)
CO2: 29 mmol/L (ref 22–32)
Calcium: 8.1 mg/dL — ABNORMAL LOW (ref 8.9–10.3)
Chloride: 99 mmol/L (ref 98–111)
Creatinine, Ser: 2.54 mg/dL — ABNORMAL HIGH (ref 0.44–1.00)
GFR calc Af Amer: 19 mL/min — ABNORMAL LOW (ref >60)
GFR calc non Af Amer: 16 mL/min — ABNORMAL LOW (ref >60)
Glucose, Bld: 98 mg/dL (ref 70–99)
Potassium: 3.4 mmol/L — ABNORMAL LOW (ref 3.5–5.1)
Sodium: 142 mmol/L (ref 135–145)

## 2020-02-17 LAB — IRON AND TIBC
Iron: 31 ug/dL (ref 28–170)
Saturation Ratios: 13 % (ref 10.4–31.8)
TIBC: 245 ug/dL — ABNORMAL LOW (ref 250–450)
UIBC: 214 ug/dL

## 2020-02-17 LAB — RETICULOCYTES
Immature Retic Fract: 24 % — ABNORMAL HIGH (ref 2.3–15.9)
RBC.: 3.41 MIL/uL — ABNORMAL LOW (ref 3.87–5.11)
Retic Count, Absolute: 147.3 10*3/uL (ref 19.0–186.0)
Retic Ct Pct: 4.3 % — ABNORMAL HIGH (ref 0.4–3.1)

## 2020-02-17 LAB — ECHOCARDIOGRAM COMPLETE
Height: 65 in
Weight: 3742.53 oz

## 2020-02-17 LAB — FERRITIN: Ferritin: 167 ng/mL (ref 11–307)

## 2020-02-17 LAB — FOLATE: Folate: 39.8 ng/mL (ref >5.9)

## 2020-02-17 LAB — VITAMIN B12: Vitamin B-12: 1086 pg/mL — ABNORMAL HIGH (ref 180–914)

## 2020-02-17 MED ORDER — FUROSEMIDE 10 MG/ML IJ SOLN
INTRAMUSCULAR | Status: AC
  Administered 2020-02-17: 10 mg
  Filled 2020-02-17: qty 4

## 2020-02-17 MED ORDER — FUROSEMIDE 10 MG/ML IJ SOLN
20.0000 mg | Freq: Every evening | INTRAMUSCULAR | Status: DC
  Administered 2020-02-17 – 2020-02-18 (×2): 20 mg via INTRAVENOUS
  Filled 2020-02-17 (×3): qty 2

## 2020-02-17 MED ORDER — FUROSEMIDE 10 MG/ML IJ SOLN
40.0000 mg | Freq: Every day | INTRAMUSCULAR | Status: DC
  Administered 2020-02-17 – 2020-02-19 (×3): 40 mg via INTRAVENOUS
  Filled 2020-02-17 (×3): qty 4

## 2020-02-17 MED ORDER — METOPROLOL TARTRATE 5 MG/5ML IV SOLN
5.0000 mg | Freq: Four times a day (QID) | INTRAVENOUS | Status: DC
  Administered 2020-02-17 – 2020-02-18 (×2): 5 mg via INTRAVENOUS
  Filled 2020-02-17 (×3): qty 5

## 2020-02-17 MED ORDER — COLCHICINE 0.3 MG HALF TABLET
0.3000 mg | ORAL_TABLET | Freq: Every day | ORAL | Status: DC
  Administered 2020-02-17 – 2020-02-24 (×8): 0.3 mg via ORAL
  Filled 2020-02-17 (×8): qty 1

## 2020-02-17 MED ORDER — HYDRALAZINE HCL 25 MG PO TABS
25.0000 mg | ORAL_TABLET | Freq: Three times a day (TID) | ORAL | Status: DC | PRN
  Administered 2020-02-20 – 2020-02-23 (×3): 25 mg via ORAL
  Filled 2020-02-17 (×3): qty 1

## 2020-02-17 MED ORDER — HYDROCODONE-ACETAMINOPHEN 10-325 MG PO TABS
0.5000 | ORAL_TABLET | ORAL | Status: DC | PRN
  Administered 2020-02-17 – 2020-02-19 (×3): 0.5 via ORAL
  Filled 2020-02-17 (×4): qty 1

## 2020-02-17 NOTE — Plan of Care (Signed)

## 2020-02-17 NOTE — Evaluation (Signed)
Physical Therapy Evaluation Patient Details Name: [name] MRN: [id number] DOB: 01/04/1929 Today's Date: 02/17/2020   History of Present Illness  86 yo female admitted with acute on chronic heart faillure. Hx of CHF, CKD, CFT, PVD, DVT, bil TKA (wears neoprene knee braces),  Clinical Impression  On eval, pt required Mod assist +2 safety for mobility. She was able to stand, pivot, and take a few ambulatory steps inside with room with use of a RW. Mobility is limited by pain, weakness, and dyspnea. Family was present during session. Discussed d/c plan-pt plans to return home with family assisting as needed. Will follow and progress activity as tolerated.     Follow Up Recommendations Home health PT;Supervision/Assistance - 24 hour    Equipment Recommendations  None recommended by PT    Recommendations for Other Services       Precautions / Restrictions Precautions Precautions: Fall Restrictions Weight Bearing Restrictions: No      Mobility  Bed Mobility Overal bed mobility: Needs Assistance Bed Mobility: Supine to Sit     Supine to sit: Mod assist;HOB elevated     General bed mobility comments: Assist for trunk and bil LEs. Utilized bedpad to aid with scooting, positioning. Increased time and effort for pt. Dyspnea 3/4. O2 95% on 2L  Transfers Overall transfer level: Needs assistance Equipment used: Rolling walker (2 wheeled) Transfers: Sit to/from [facility] Sit to Stand: Mod assist;From elevated surface Stand pivot transfers: Min assist       General transfer comment: Assist to power up, stabilize, control descent. VCs safety, technique, hand placement. Increased time. Remained on [place] O2.  Ambulation/Gait Ambulation/Gait assistance: Min assist Gait Distance (Feet): 3 Feet Assistive device: Rolling walker (2 wheeled) Gait Pattern/deviations: Step-to pattern     General Gait Details: Assist to stabilize pt throughout short distance from bed  to recliner. Cues for safety, distance from RW. Pt fatigue fairly easily. Remained on [place] O2.  Stairs            Wheelchair Mobility    Modified Rankin (Stroke Patients Only)       Balance Overall balance assessment: Needs assistance         Standing balance support: Bilateral upper extremity supported Standing balance-Leahy Scale: Poor                               Pertinent Vitals/Pain Pain Assessment: 0-10 Pain Score: 8  Pain Location: bil LEs/feet Pain Descriptors / Indicators: Aching;Discomfort;Sore Pain Intervention(s): Limited activity within patient's tolerance;Monitored during session;Repositioned    Home Living Family/patient expects to be discharged to:: Private residence Living Arrangements: Alone Available Help at Discharge: Family;Available PRN/intermittently Type of Home: House Home Access: Level entry     Home Layout: One level Home Equipment: Walker - 2 wheels;Bedside commode;Cane - single point;Shower seat (lift chair)      Prior Function Level of Independence: Needs assistance   Gait / Transfers Assistance Needed: uses RW for ambulation  ADL's / Homemaking Assistance Needed: aide MWF for assist with cleaning, bathing, dressing. Meals on wheels + family for meals        Hand Dominance        Extremity/Trunk Assessment   Upper Extremity Assessment Upper Extremity Assessment: Generalized weakness    Lower Extremity Assessment Lower Extremity Assessment: Generalized weakness    Cervical / Trunk Assessment Cervical / Trunk Assessment: Normal  Communication   Communication: No difficulties  Cognition Arousal/Alertness: Awake/alert  Behavior During Therapy: WFL for tasks assessed/performed Overall Cognitive Status: Within Functional Limits for tasks assessed                                        General Comments      Exercises     Assessment/Plan    PT Assessment Patient needs continued PT  services  PT Problem List Decreased strength;Decreased mobility;Decreased activity tolerance;Decreased balance;Decreased knowledge of use of DME;Pain;Obesity       PT Treatment Interventions DME instruction;Gait training;Therapeutic exercise;Therapeutic activities;Patient/family education;Balance training;Functional mobility training    PT Goals (Current goals can be found in the Care Plan section)  Acute Rehab PT Goals Patient Stated Goal: home PT Goal Formulation: With patient/family Time For Goal Achievement: 03/02/20 Potential to Achieve Goals: Good    Frequency Min 3X/week   Barriers to discharge        Co-evaluation               AM-PAC PT "6 Clicks" Mobility  Outcome Measure Help needed turning from your back to your side while in a flat bed without using bedrails?: A Lot Help needed moving from lying on your back to sitting on the side of a flat bed without using bedrails?: A Lot Help needed moving to and from a bed to a chair (including a wheelchair)?: A Lot Help needed standing up from a chair using your arms (e.g., wheelchair or bedside chair)?: A Lot Help needed to walk in hospital room?: A Lot Help needed climbing 3-5 steps with a railing? : Total 6 Click Score: 11    End of Session Equipment Utilized During Treatment: Gait belt Activity Tolerance: Patient limited by fatigue Patient left: in chair;with call bell/phone within reach;with chair alarm set   PT Visit Diagnosis: Difficulty in walking, not elsewhere classified (R26.2);Muscle weakness (generalized) (M62.81);Pain Pain - Right/Left:  (bil) Pain - part of body: Leg    Time: 1517-6160 PT Time Calculation (min) (ACUTE ONLY): 29 min   Charges:   PT Evaluation $PT Eval Low Complexity: 1 Low PT Treatments $Gait Training: 8-22 mins          Doreatha Massed, PT Acute Rehabilitation  Office: [phone] Pager: [phone]

## 2020-02-17 NOTE — Progress Notes (Signed)
  Echocardiogram 2D Echocardiogram has been performed.  Darlina Sicilian M 02/17/2020, 8:14 AM

## 2020-02-17 NOTE — Progress Notes (Addendum)
PROGRESS NOTE  :  [name]  SAY:[id number] DOB: [birth year] DOA: 02/16/2020 PCP: Antonietta Jewel, MD  Brief Narrative:  86 year old black female known HTN, HLD, CKD stage IIb Multiple joint replacements in the past, 5 mm distal right ureteric stone 2016,?  Hypertrophic obstructive cardiomyopathy documented back in 2016  Recently sent her sister to hospice at [facility] on 02/15/2020 Comes to the [facility] ED SLB X 2 weeks in the setting of dietary indiscretion + LE edema-given 0.4 nitro placed on oxygen Rx ED Lasix 40 IV Suspected HFpEF acute-prior echo 2019   ?functio   Assessment & Plan:   Active Problems:   Acute respiratory failure with hypoxia (HCC)   History of DVT (deep vein thrombosis)   Hypertension   Sinus bradycardia   Acute on chronic diastolic heart failure ([place])   1. Acute hypoxic respiratory failure secondary to heart failure a. Respiratory rate above 25 b. Nursing aware to monitor closely with diuresis and alert me if becomes unstable 2. Acute HFrEF 3. Severe pulmonary hypertension on echo 4. -Home meds include torsemide 40 twice daily a. Echocardiogram shows severe pulmonary hypertension b. Received Lasix multiple rounds 6/19-because of underlying CKD 3B careful Lasix 40 a.m. IV/20 p.m. IV with aim for gradual and slow net negative balance c. Baseline weight around 221 pounds at home-last weight per patient in the outpatient setting was 231 on 6/18 d. May need to adjust GDMT Imdur/hydralazine to facilitate for proper afterload reduction e. Strict I/O, daily weights 5.  myoview 2002 no WM anomally 6. Prior DVT-continue Imdur 120 daily a.  prior DVT-looks like had non occlusive popliteal DVT 2016 and was taken off Blood thinner b. Home meds include aspirin 81 which has been held 7. Gout a. continue allopurinol 100 twice daily 8. HLD- a. continue Crestor 10 every afternoon 9. Hypothyroid a. -continue levothyroxine 75 daily b. Will need a TSH in about 3 to 4  weeks post discharge 10. Anemia iron deficiency a. Get panel b. Hold ASA c. Get guaic 11. Prior ureteric stone 2016 12. Depression a. Continue hydroxyzine 50 at bedtime  DVT prophylaxis: Lovenox Code Status: Full code Family Communication: Long discussion with daughter at the bedside-patient has sitters that come in Monday Wednesday Friday and 8-she has not been up and out of chair so she can probably be seen by therapy Disposition:   Status is: Inpatient  Remains inpatient appropriate because:Hemodynamically unstable, Persistent severe electrolyte disturbances, Ongoing diagnostic testing needed not appropriate for outpatient work up, Unsafe d/c plan and IV treatments appropriate due to intensity of illness or inability to take PO   Dispo: The patient is from: Home              Anticipated d/c is to: Home              Anticipated d/c date is: 3 days              Patient currently is not medically stable to d/c.  Consultants:   None  Procedures: Echocardiogram 6/20 65 to 70%. The  left ventricle has normal function. The left ventricle has no regional  wall motion abnormalities. There is severe concentric left ventricular  hypertrophy. Left ventricular diastolic  parameters are consistent with Grade II diastolic dysfunction  (pseudonormalization). Elevated left ventricular end-diastolic pressure.  The average left ventricular global longitudinal strain is -14.2 %.  2. Right ventricular systolic function is normal. The right ventricular  size is normal. There is severely elevated pulmonary artery systolic  pressure. The estimated right ventricular systolic pressure is 93.5 mmHg.  3. Left atrial size was mildly dilated.  4. Large pleural effusion in the left lateral region.  5. The mitral valve is normal in structure. Moderate mitral valve  regurgitation. No evidence of mitral stenosis.  6. The aortic valve is normal in structure. Aortic valve regurgitation is  mild. Mild  to moderate aortic valve sclerosis/calcification is present,  without any evidence of aortic stenosis.  7. The inferior vena cava is normal in size with greater than 50%  respiratory variability, suggesting right atrial pressure of 3 mmHg  Antimicrobials: None   Subjective: Breathing a little hard but able to verbalize fair no chest pain fever chills States swelling started over the past 3 to 4 weeks has been dealing with her sister and her transition to hospice Admits to noncompliance on diet and excess fluid Seems very compliant at baseline weight is herself regularly  Objective: Vitals:   02/16/20 2025 02/16/20 2032 02/17/20 0359 02/17/20 0500  BP: (!) 186/56  (!) 170/66   Pulse: 66  61   Resp: 18  16   Temp: 99.2 F (37.3 C)  98.5 F (36.9 C)   TempSrc: Oral  Oral   SpO2: 96%  98%   Weight:  106.1 kg  106.1 kg  Height:  5\' 5"  (1.651 m)      Intake/Output Summary (Last 24 hours) at 02/17/2020 7017 Last data filed at 02/17/2020 0400 Gross per 24 hour  Intake --  Output 900 ml  Net -900 ml   Filed Weights   02/16/20 2032 02/17/20 0500  Weight: 106.1 kg 106.1 kg    Examination:  General exam: EOMI NCAT Mallampati 3 JVD present at 30 degrees cannot appreciate bruits-slightly increased work of breathing S1-S2 no murmur Monitors show multiple PVCs Gastrointestinal system: Obese nontender no rebound no guarding. Central nervous system: Neurologically intact Extremities: Grade 2-3 lower extremity pitting edema Skin: As above no rash Psychiatry: Euthymic pleasant  Data Reviewed: I have personally reviewed following labs and imaging studies Sodium 142 potassium down from 3.9-3.4 BUN/creatinine down from 55/2.8-55/2.5 Troponins trend flat 24 through 26 Hemoglobin down from 8.7-8.0-->usual baseline seems to be 11 Platelet down to 149  Radiology Studies: DG Chest Port 1 View  Result Date: 02/16/2020 CLINICAL DATA:  Shortness of breath, lower extremity edema EXAM:  PORTABLE CHEST 1 VIEW COMPARISON:  05/02/2018 FINDINGS: Stable cardiomegaly. Atherosclerotic calcification of the aortic knob. Pulmonary vascular congestion with mild diffuse interstitial prominence. Small nodular densities within the bilateral perihilar regions are unchanged in appearance from 01/17/2017. Probable small right pleural effusion. No pneumothorax. IMPRESSION: Findings suggesting CHF with mild interstitial edema. Electronically Signed   By: [name] D.O.   On: 02/16/2020 15:37     Scheduled Meds: . allopurinol  100 mg Oral BID  . aspirin EC  81 mg Oral Daily  . ferrous sulfate  325 mg Oral Q lunch  . heparin  5,000 Units Subcutaneous Q8H  . hydrALAZINE  100 mg Oral TID  . isosorbide mononitrate  120 mg Oral Daily  . levothyroxine  75 mcg Oral Daily  . rosuvastatin  10 mg Oral QPM  . sodium chloride flush  3 mL Intravenous Q12H   Continuous Infusions: . sodium chloride       LOS: 1 day    Time spent: [place], MD [facility] To contact the attending provider between 7A-7P or the covering provider during after hours 7P-7A, please log into  the web site [email or web address] and access using universal [place] password for that web site. If you do not have the password, please call the hospital operator.  02/17/2020, 9:05 AM

## 2020-02-18 LAB — OCCULT BLOOD X 1 CARD TO LAB, STOOL: Fecal Occult Bld: NEGATIVE

## 2020-02-18 LAB — COMPREHENSIVE METABOLIC PANEL
ALT: 11 U/L (ref 0–44)
AST: 14 U/L — ABNORMAL LOW (ref 15–41)
Albumin: 2.9 g/dL — ABNORMAL LOW (ref 3.5–5.0)
Alkaline Phosphatase: 54 U/L (ref 38–126)
Anion gap: 11 (ref 5–15)
BUN: 52 mg/dL — ABNORMAL HIGH (ref 8–23)
CO2: 29 mmol/L (ref 22–32)
Calcium: 8 mg/dL — ABNORMAL LOW (ref 8.9–10.3)
Chloride: 98 mmol/L (ref 98–111)
Creatinine, Ser: 2.65 mg/dL — ABNORMAL HIGH (ref 0.44–1.00)
GFR calc Af Amer: 18 mL/min — ABNORMAL LOW (ref >60)
GFR calc non Af Amer: 15 mL/min — ABNORMAL LOW (ref >60)
Glucose, Bld: 107 mg/dL — ABNORMAL HIGH (ref 70–99)
Potassium: 3.5 mmol/L (ref 3.5–5.1)
Sodium: 138 mmol/L (ref 135–145)
Total Bilirubin: 1 mg/dL (ref 0.3–1.2)
Total Protein: 5.7 g/dL — ABNORMAL LOW (ref 6.5–8.1)

## 2020-02-18 LAB — MAGNESIUM: Magnesium: 2 mg/dL (ref 1.7–2.4)

## 2020-02-18 LAB — CBC
HCT: 27 % — ABNORMAL LOW (ref 36.0–46.0)
Hemoglobin: 8 g/dL — ABNORMAL LOW (ref 12.0–15.0)
MCH: 25.4 pg — ABNORMAL LOW (ref 26.0–34.0)
MCHC: 29.6 g/dL — ABNORMAL LOW (ref 30.0–36.0)
MCV: 85.7 fL (ref 80.0–100.0)
Platelets: 145 10*3/uL — ABNORMAL LOW (ref 150–400)
RBC: 3.15 MIL/uL — ABNORMAL LOW (ref 3.87–5.11)
RDW: 20.4 % — ABNORMAL HIGH (ref 11.5–15.5)
WBC: 4.8 10*3/uL (ref 4.0–10.5)
nRBC: 0 % (ref 0.0–0.2)

## 2020-02-18 LAB — RETICULOCYTES
Immature Retic Fract: 22.9 % — ABNORMAL HIGH (ref 2.3–15.9)
RBC.: 3.17 MIL/uL — ABNORMAL LOW (ref 3.87–5.11)
Retic Count, Absolute: 137.9 10*3/uL (ref 19.0–186.0)
Retic Ct Pct: 4.4 % — ABNORMAL HIGH (ref 0.4–3.1)

## 2020-02-18 LAB — IRON AND TIBC
Iron: 28 ug/dL (ref 28–170)
Saturation Ratios: 12 % (ref 10.4–31.8)
TIBC: 229 ug/dL — ABNORMAL LOW (ref 250–450)
UIBC: 201 ug/dL

## 2020-02-18 LAB — FOLATE: Folate: 34.2 ng/mL (ref >5.9)

## 2020-02-18 LAB — VITAMIN B12: Vitamin B-12: 1038 pg/mL — ABNORMAL HIGH (ref 180–914)

## 2020-02-18 LAB — FERRITIN: Ferritin: 146 ng/mL (ref 11–307)

## 2020-02-18 MED ORDER — SODIUM CHLORIDE 0.9 % IV SOLN
510.0000 mg | Freq: Once | INTRAVENOUS | Status: AC
  Administered 2020-02-18: 510 mg via INTRAVENOUS
  Filled 2020-02-18: qty 510

## 2020-02-18 MED ORDER — METOPROLOL TARTRATE 5 MG/5ML IV SOLN
5.0000 mg | Freq: Four times a day (QID) | INTRAVENOUS | Status: DC | PRN
  Administered 2020-02-23: 5 mg via INTRAVENOUS
  Filled 2020-02-18: qty 5

## 2020-02-18 NOTE — Plan of Care (Signed)

## 2020-02-18 NOTE — TOC Initial Note (Signed)
Transition of Care [facility]) - Initial/Assessment Note    Patient Details  Name: [name] MRN: [id number] Date of Birth: 28-May-1929  Transition of Care (TOC) CM/SW Contact:    Joaquin Courts, RN Phone Number: 02/18/2020, 1:12 PM  Clinical Narrative:                 CM spoke with patient's daughter who reports she wishes to take her mother home when medically stable for discharge.   Patient was recommended for HHPT services and daughter is agreeable to this.  Daughter reports patient has an aide from [facility] agency that come 3x a week to assist with personal care needs.  [facility] set up to provide HHPT and RN services for heart failure management.   Expected Discharge Plan: [place] Barriers to Discharge: Continued Medical Work up   Patient Goals and CMS Choice Patient states their goals for this hospitalization and ongoing recovery are:: to go home with therapy CMS Medicare.gov Compare Post Acute Care list provided to:: Patient Represenative (must comment) Choice offered to / list presented to : Adult Children  Expected Discharge Plan and Services Expected Discharge Plan: [place]   Discharge Planning Services: CM Consult Post Acute Care Choice: Home Health Living arrangements for the past 2 months: Single Family Home                 DME Arranged: N/A DME Agency: NA       HH Arranged: PT, RN [place] Agency: [place] Date HH Agency Contacted: 02/18/20 Time HH Agency Contacted: 6 Representative spoke with at [place]: Sharmon Revere  Prior Living Arrangements/Services Living arrangements for the past 2 months: [place] Lives with:: Adult Children Patient language and need for interpreter reviewed:: Yes Do you feel safe going back to the place where you live?: Yes      Need for Family Participation in Patient Care: Yes (Comment) Care giver support system in place?: Yes (comment)   Criminal  Activity/Legal Involvement Pertinent to Current Situation/Hospitalization: No - Comment as needed  Activities of Daily Living Home Assistive Devices/Equipment: Walker (specify type) (Four wheel walker with seat) ADL Screening (condition at time of admission) Patient's cognitive ability adequate to safely complete daily activities?: Yes Is the patient deaf or have difficulty hearing?: Yes Does the patient have difficulty seeing, even when wearing glasses/contacts?: No Does the patient have difficulty concentrating, remembering, or making decisions?: No Patient able to express need for assistance with ADLs?: Yes Does the patient have difficulty dressing or bathing?: Yes Independently performs ADLs?: No Communication: Independent Dressing (OT): Needs assistance Is this a change from baseline?: Pre-admission baseline Grooming: Needs assistance Is this a change from baseline?: Pre-admission baseline Feeding: Independent Bathing: Needs assistance Is this a change from baseline?: Pre-admission baseline Toileting: Needs assistance Is this a change from baseline?: Pre-admission baseline In/Out Bed: Needs assistance Is this a change from baseline?: Pre-admission baseline Walks in Home: Needs assistance Is this a change from baseline?: Pre-admission baseline Does the patient have difficulty walking or climbing stairs?: Yes Weakness of Legs: Both Weakness of Arms/Hands: Both  Permission Sought/Granted                  Emotional Assessment           Psych Involvement: No (comment)  Admission diagnosis:  Acute on chronic diastolic heart failure (HCC) [I50.33] CKD (chronic kidney disease) stage 4, GFR 15-29 ml/min (HCC) [N18.4] Acute  on chronic combined systolic and diastolic CHF (congestive heart failure) ([place]) [I50.43] Anemia, unspecified type [D64.9] Patient Active Problem List   Diagnosis Date Noted  . Acute on chronic diastolic heart failure ([place]) 02/16/2020  . Hypertension  01/03/2018  . Atypical chest pain 01/03/2018  . Sinus bradycardia 01/03/2018  . Hypertensive heart and kidney disease with heart failure and with chronic kidney disease stage IV ([place])   . Fever, unspecified 01/06/2016  . Acute respiratory failure with hypoxia ([place]) 01/01/2016  . Chronic diastolic heart failure ([place]) 01/01/2016  . History of DVT (deep vein thrombosis) 01/01/2016  . Chronic kidney disease, stage IV (severe) ([place]) 01/01/2016  . Anemia of renal disease 01/01/2016  . Iron deficiency anemia 01/01/2016  . Hyperkalemia 11/21/2014  . Diarrhea 11/21/2014  . Metabolic acidosis 79/81/0254  . Renal failure (ARF), acute on chronic (HCC) 11/21/2014   PCP:  Antonietta Jewel, MD Pharmacy:   [facility] [place], [place] [address] Phone: [phone] Fax: [phone]  [facility] - [place], [place] - [address] [place] [address] Phone: [phone] Fax: [phone]     Social Determinants of Health (SDOH) Interventions    Readmission Risk Interventions No flowsheet data found.

## 2020-02-18 NOTE — Progress Notes (Signed)
PROGRESS NOTE  :  [name]  XTG:[id number] DOB: [birth year] DOA: 02/16/2020 PCP: Antonietta Jewel, MD  Brief Narrative:  86 year old black female known HTN, HLD, CKD stage IIb Multiple joint replacements in the past, 5 mm distal right ureteric stone 2016,?  Hypertrophic obstructive cardiomyopathy documented back in 2016  Recently sent her sister to hospice at [facility] on 02/15/2020 Comes to the [facility] ED SLB X 2 weeks in the setting of dietary indiscretion + LE edema-given 0.4 nitro placed on oxygen Rx ED Lasix 40 IV Suspected HFpEF acute-possibly secondary to high-output heart failure from anemia She has been diuresed Because of her drop in hemoglobin we're Hemoccult in her as she is reported some loose and dark stools in the past but none recently and note that she is on iron   Assessment & Plan:   Active Problems:   Acute respiratory failure with hypoxia (HCC)   History of DVT (deep vein thrombosis)   Hypertension   Sinus bradycardia   Acute on chronic diastolic heart failure ([place])   1. Acute hypoxic respiratory failure secondary to heart failure a. Respiratory rate improved b. Patient feeling better and less short winded c. Monitor trends and de-escalate off oxygen in the next several days 2. Acute HFrEF a. Severe pulmonary hypertension on echo and possibility of high-output heart failure from anemia likely secondary to renal chronic disease  b. -Home meds include torsemide 40 twice daily i. Echocardiogram shows severe pulmonary hypertension ii. Received Lasix multiple rounds 6/19- careful Lasix 40 a.m. IV/20 p.m. IV with aim for gradual and slow net negative balance iii. Weight is down from 106 to 103 kg and I have cut back fluids from 1800 to 1500 cc iv. May need to adjust GDMT Imdur/hydralazine to facilitate for proper afterload reduction v. Strict I/O, daily weights 3.  myoview 2002 no WM anomally 4. Prior DVT-continue Imdur 120 daily a.  prior DVT-looks like had non  occlusive popliteal DVT 2016 and was taken off Blood thinner b. Home meds include aspirin 81 which has been held because of low hemoglobin 5. Gout a. continue allopurinol 100 twice daily 6. HLD- a. continue Crestor 10 every afternoon 7. Hypothyroid a. -continue levothyroxine 75 daily b. Will need a TSH in about 3 to 4 weeks post discharge 8. CKD 3B a. We'll get phosphorus in the morning and will need to alert nephrology rethis admission b. I will CC Dr. Moshe Cipro for planning purposes as GFR is now about 15 and this can be coordinated as an outpatient 9. Anemia likely of renal disease a. TSAT is low-giving IV iron b. last colonoscopy- apparently in 2005 without any inflammation or other findings c. Hold ASA at this time d. Await guaiac stool 10. Prior ureteric stone 2016 11. Depression a. Continue hydroxyzine 50 at bedtime  DVT prophylaxis: Lovenox Code Status: Full code Family Communication: Long discussion with daughter at the bedside-patient has sitters that come in Monday Wednesday Friday and 8- Therapy think she is high risk with family is insistent on taking her home Disposition:   Status is: Inpatient  Remains inpatient appropriate because:Hemodynamically unstable, Persistent severe electrolyte disturbances, Ongoing diagnostic testing needed not appropriate for outpatient work up, Unsafe d/c plan and IV treatments appropriate due to intensity of illness or inability to take PO   Dispo: The patient is from: Home              Anticipated d/c is to: Home  Anticipated d/c date is: 3 days              Patient currently is not medically stable to d/c.  Consultants:   None  Procedures: Echocardiogram 6/20 65 to 70%. The  left ventricle has normal function. The left ventricle has no regional  wall motion abnormalities. There is severe concentric left ventricular  hypertrophy. Left ventricular diastolic  parameters are consistent with Grade II diastolic  dysfunction  (pseudonormalization). Elevated left ventricular end-diastolic pressure.  The average left ventricular global longitudinal strain is -14.2 %.  2. Right ventricular systolic function is normal. The right ventricular  size is normal. There is severely elevated pulmonary artery systolic  pressure. The estimated right ventricular systolic pressure is 33.2 mmHg.  3. Left atrial size was mildly dilated.  4. Large pleural effusion in the left lateral region.  5. The mitral valve is normal in structure. Moderate mitral valve  regurgitation. No evidence of mitral stenosis.  6. The aortic valve is normal in structure. Aortic valve regurgitation is  mild. Mild to moderate aortic valve sclerosis/calcification is present,  without any evidence of aortic stenosis.  7. The inferior vena cava is normal in size with greater than 50%  respiratory variability, suggesting right atrial pressure of 3 mmHg  Antimicrobials: None   Subjective: Breathing is good She was out of chair for all meals this morning No chest pain no fever no chills No nausea no vomiting Swelling is decreased And lower extremity  Objective: Vitals:   02/18/20 0004 02/18/20 0500 02/18/20 0503 02/18/20 1029  BP: (!) 190/58  (!) 157/52 (!) 160/66  Pulse: 62  (!) 58   Resp: 20  20 14   Temp: 98.9 F (37.2 C)  98.6 F (37 C)   TempSrc: Oral  Oral   SpO2: 98%  99%   Weight:  103.3 kg    Height:        Intake/Output Summary (Last 24 hours) at 02/18/2020 1134 Last data filed at 02/18/2020 0500 Gross per 24 hour  Intake 500 ml  Output 1000 ml  Net -500 ml   Filed Weights   02/16/20 2032 02/17/20 0500 02/18/20 0500  Weight: 106.1 kg 106.1 kg 103.3 kg    Examination:  General exam: EOMI NCAT Mallampati 3 JVD diminished today S1-S2 no murmur Monitors show multiple PVCs Gastrointestinal system: Obese nontender no rebound no guarding. Central nervous system: Neurologically intact Extremities: Grade 1  lower extremity edema Skin: As above no rash Psychiatry: Euthymic pleasant  Data Reviewed: I have personally reviewed following labs and imaging studies Sodium 142-->138 Potassium 3.5 BUN/creatinine down from 55/2.8-55/2.5-->52/2.6 Troponins trend flat 24 through 26 Hemoglobin down from 8.7-8.0--> still 8.0 Iron studies show iron is 28 saturation ratio is 12 Platelet is 145  Radiology Studies: DG Chest Port 1 View  Result Date: 02/16/2020 CLINICAL DATA:  Shortness of breath, lower extremity edema EXAM: PORTABLE CHEST 1 VIEW COMPARISON:  05/02/2018 FINDINGS: Stable cardiomegaly. Atherosclerotic calcification of the aortic knob. Pulmonary vascular congestion with mild diffuse interstitial prominence. Small nodular densities within the bilateral perihilar regions are unchanged in appearance from 01/17/2017. Probable small right pleural effusion. No pneumothorax. IMPRESSION: Findings suggesting CHF with mild interstitial edema. Electronically Signed   By: [name] D.O.   On: 02/16/2020 15:37   ECHOCARDIOGRAM COMPLETE  Result Date: 02/17/2020    ECHOCARDIOGRAM REPORT   Patient Name:   IVANKA KIRSHNER Date of Exam: 02/17/2020 Medical Rec #:  [id number]      Height:  65.0 in Accession #:    [id number]     Weight:       233.9 lb Date of Birth:  1929-08-11      BSA:          2.114 m Patient Age:    31 years       BP:           170/66 mmHg Patient Gender: F              HR:           61 bpm. Exam Location:  Inpatient Procedure: 2D Echo and Strain Analysis Indications:    Congestive Heart Failure 428.0 / I50.9  History:        Patient has prior history of Echocardiogram examinations, most                 recent 09/21/2017. CHF, Signs/Symptoms:Murmur; Risk                 Factors:Dyslipidemia. Hypertensive heart and kidney disease with                 heart failure and with chronic kidney disease.  Sonographer:    Darlina Sicilian RDCS Referring Phys: [id number] [place]  1. Left  ventricular ejection fraction, by estimation, is 65 to 70%. The left ventricle has normal function. The left ventricle has no regional wall motion abnormalities. There is severe concentric left ventricular hypertrophy. Left ventricular diastolic  parameters are consistent with Grade II diastolic dysfunction (pseudonormalization). Elevated left ventricular end-diastolic pressure. The average left ventricular global longitudinal strain is -14.2 %.  2. Right ventricular systolic function is normal. The right ventricular size is normal. There is severely elevated pulmonary artery systolic pressure. The estimated right ventricular systolic pressure is 58.0 mmHg.  3. Left atrial size was mildly dilated.  4. Large pleural effusion in the left lateral region.  5. The mitral valve is normal in structure. Moderate mitral valve regurgitation. No evidence of mitral stenosis.  6. The aortic valve is normal in structure. Aortic valve regurgitation is mild. Mild to moderate aortic valve sclerosis/calcification is present, without any evidence of aortic stenosis.  7. The inferior vena cava is normal in size with greater than 50% respiratory variability, suggesting right atrial pressure of 3 mmHg. FINDINGS  Left Ventricle: Left ventricular ejection fraction, by estimation, is 65 to 70%. The left ventricle has normal function. The left ventricle has no regional wall motion abnormalities. The average left ventricular global longitudinal strain is -14.2 %. The left ventricular internal cavity size was normal in size. There is severe concentric left ventricular hypertrophy. Left ventricular diastolic parameters are consistent with Grade II diastolic dysfunction (pseudonormalization). Elevated left ventricular end-diastolic pressure. Right Ventricle: The right ventricular size is normal. No increase in right ventricular wall thickness. Right ventricular systolic function is normal. There is severely elevated pulmonary artery systolic  pressure. The tricuspid regurgitant velocity is 3.50 m/s, and with an assumed right atrial pressure of 15 mmHg, the estimated right ventricular systolic pressure is 99.8 mmHg. Left Atrium: Left atrial size was mildly dilated. Right Atrium: Right atrial size was normal in size. Pericardium: There is no evidence of pericardial effusion. The mitral valve is normal in structure. Normal mobility of the mitral valve leaflets. Moderate mitral valve regurgitation, with posteriorly-directed jet. No evidence of mitral valve stenosis. Tricuspid Valve: The tricuspid valve is normal in structure. Tricuspid valve regurgitation is mild . No evidence of tricuspid stenosis. Aortic Valve:  The aortic valve is normal in structure.. There is moderate thickening and moderate calcification of the aortic valve. Aortic valve regurgitation is mild. Mild to moderate aortic valve sclerosis/calcification is present, without any evidence of aortic stenosis. There is moderate thickening of the aortic valve. There is moderate calcification of the aortic valve. Pulmonic Valve: The pulmonic valve was normal in structure. Pulmonic valve regurgitation is mild. No evidence of pulmonic stenosis. Aorta: The aortic root is normal in size and structure. Venous: The inferior vena cava is normal in size with greater than 50% respiratory variability, suggesting right atrial pressure of 3 mmHg. IAS/Shunts: No atrial level shunt detected by color flow Doppler. Additional Comments: There is a large pleural effusion in the left lateral region.  LEFT VENTRICLE PLAX 2D LVIDd:         4.00 cm  Diastology LVIDs:         2.70 cm  LV e' lateral:   8.92 cm/s LV PW:         1.30 cm  LV E/e' lateral: 17.0 LV IVS:        2.00 cm  LV e' medial:    4.79 cm/s LVOT diam:     1.60 cm  LV E/e' medial:  31.7 LV SV:         58 LV SV Index:   27       2D Longitudinal Strain LVOT Area:     2.01 cm 2D Strain GLS Avg:     -14.2 %  RIGHT VENTRICLE RV S prime:     12.60 cm/s TAPSE  (M-mode): 2.4 cm LEFT ATRIUM             Index       RIGHT ATRIUM           Index LA diam:        4.40 cm 2.08 cm/m  RA Area:     17.20 cm LA Vol (A2C):   52.6 ml 24.88 ml/m RA Volume:   49.90 ml  23.60 ml/m LA Vol (A4C):   67.9 ml 32.11 ml/m LA Biplane Vol: 60.7 ml 28.71 ml/m  AORTIC VALVE LVOT Vmax:   131.00 cm/s LVOT Vmean:  84.600 cm/s LVOT VTI:    0.288 m  AORTA Ao Root diam: 3.00 cm MITRAL VALVE                TRICUSPID VALVE MV Area (PHT): 4.63 cm     TR Peak grad:   49.0 mmHg MV Vmean:      26.4 cm/s    TR Vmax:        350.00 cm/s MV Decel Time: 164 msec MV E velocity: 152.00 cm/s  SHUNTS MV A velocity: 79.20 cm/s   Systemic VTI:  0.29 m MV E/A ratio:  1.92         Systemic Diam: 1.60 cm Ena Dawley MD Electronically signed by Ena Dawley MD Signature Date/Time: 02/17/2020/12:41:37 PM    Final      Scheduled Meds: . allopurinol  100 mg Oral BID  . colchicine  0.3 mg Oral Daily  . ferrous sulfate  325 mg Oral Q lunch  . furosemide  20 mg Intravenous QPM  . furosemide  40 mg Intravenous Daily  . heparin  5,000 Units Subcutaneous Q8H  . hydrALAZINE  100 mg Oral TID  . isosorbide mononitrate  120 mg Oral Daily  . levothyroxine  75 mcg Oral Daily  . rosuvastatin  10 mg Oral QPM  .  sodium chloride flush  3 mL Intravenous Q12H   Continuous Infusions: . sodium chloride       LOS: 2 days    Time spent: [place], MD [facility] To contact the attending provider between 7A-7P or the covering provider during after hours 7P-7A, please log into the web site [email or web address] and access using universal [place] password for that web site. If you do not have the password, please call the hospital operator.  02/18/2020, 11:34 AM

## 2020-02-18 NOTE — Evaluation (Signed)
Occupational Therapy Evaluation Patient Details Name: [name] MRN: [id number] DOB: 1929-07-02 Today's Date: 02/18/2020    History of Present Illness 86 yo female admitted with acute on chronic heart faillure. Hx of CHF, CKD, CFT, PVD, DVT, bil TKA (wears neoprene knee braces),   Clinical Impression   Pt ambulated with a rollator and was assisted for housekeeping, heavy meal prep and ADL 3 days a week. 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 She presents with significant weakness and B LE pain and was unable to stand with 2 person assist from elevated bed this visit. She requires set up to total assist for ADL. Recommending continued rehab in SNF prior to return home. Will follow acutely.    Follow Up Recommendations  SNF;Supervision/Assistance - 24 hour    Equipment Recommendations  None recommended by OT    Recommendations for Other Services       Precautions / Restrictions Precautions Precautions: Fall Required Braces or Orthoses: Other Brace Other Brace: pt has B knee braces Restrictions Weight Bearing Restrictions: No      Mobility Bed Mobility Overal bed mobility: Needs Assistance Bed Mobility: Supine to Sit     Supine to sit: Mod assist;HOB elevated     General bed mobility comments: increased time, min assist for LEs over EOB, mod assist for hips to EOB using bed pad, assisted LEs back into bed  Transfers Overall transfer level: Needs assistance Equipment used: Rolling walker (2 wheeled)             General transfer comment: attempted to stand x 3 from elevated bed using momentum, pt unable to clear her hips    Balance Overall balance assessment: Needs assistance Sitting-balance support: Feet supported Sitting balance-Leahy Scale: Poor Sitting balance - Comments: posterior lean unless using B UEs     Standing balance-Leahy Scale: Zero                             ADL either performed or assessed with clinical  judgement   ADL Overall ADL's : Needs assistance/impaired Eating/Feeding: Set up;Sitting   Grooming: Set up;Sitting Grooming Details (indicate cue type and reason): min for sitting balance Upper Body Bathing: Moderate assistance;Sitting   Lower Body Bathing: Total assistance;Bed level   Upper Body Dressing : Sitting;Minimal assistance   Lower Body Dressing: Total assistance;Bed level                       Vision Patient Visual Report: No change from baseline       Perception     Praxis      Pertinent Vitals/Pain Pain Assessment: Faces Faces Pain Scale: Hurts even more Pain Location: bil LEs/feet Pain Descriptors / Indicators: Aching;Discomfort;Sore Pain Intervention(s): Monitored during session;Repositioned     Hand Dominance Right   Extremity/Trunk Assessment Upper Extremity Assessment Upper Extremity Assessment: Generalized weakness (trigger finger L third finger)   Lower Extremity Assessment Lower Extremity Assessment: Defer to PT evaluation   Cervical / Trunk Assessment Cervical / Trunk Assessment: Other exceptions Cervical / Trunk Exceptions: obesity   Communication Communication Communication: No difficulties   Cognition Arousal/Alertness: Awake/alert Behavior During Therapy: WFL for tasks assessed/performed Overall Cognitive Status: Within Functional Limits for tasks assessed                                     General  Comments       Exercises     Shoulder Instructions      Home Living Family/patient expects to be discharged to:: Private residence Living Arrangements: Alone Available Help at Discharge: Family;Available PRN/intermittently Type of Home: House Home Access: Level entry     Home Layout: One level               Home Equipment: Walker - 2 wheels;Bedside commode;Cane - single point;Shower seat (lift chair)          Prior Functioning/Environment Level of Independence: Needs assistance  Gait /  Transfers Assistance Needed: uses rollator for ambulation ADL's / Homemaking Assistance Needed: aide MWF for assist with cleaning, bathing, dressing. Meals on wheels + family for meals, can make her own breakfast at the stove            OT Problem List: Decreased strength;Decreased activity tolerance;Impaired balance (sitting and/or standing);Decreased knowledge of use of DME or AE;Obesity;Pain      OT Treatment/Interventions: Self-care/ADL training;DME and/or AE instruction;Patient/family education;Balance training;Therapeutic activities;Energy conservation    OT Goals(Current goals can be found in the care plan section) Acute Rehab OT Goals Patient Stated Goal: home OT Goal Formulation: With patient Time For Goal Achievement: 03/03/20 Potential to Achieve Goals: Good ADL Goals Pt Will Perform Grooming: with set-up;sitting Pt Will Perform Upper Body Bathing: with set-up;sitting Pt Will Perform Upper Body Dressing: with set-up;sitting Pt Will Transfer to Toilet: with mod assist;ambulating;stand pivot transfer Additional ADL Goal #1: Pt will demonstrate ability to sit at EOB without UE support while engaged in ADL. Additional ADL Goal #2: Pt will incorporate energy conservation strategies during ADL and mobility. Additional ADL Goal #3: Pt will perform bed mobility with min assist in preparation for ADL.  OT Frequency: Min 2X/week   Barriers to D/C:            Co-evaluation              AM-PAC OT "6 Clicks" Daily Activity     Outcome Measure Help from another person eating meals?: None Help from another person taking care of personal grooming?: A Little Help from another person toileting, which includes using toliet, bedpan, or urinal?: Total Help from another person bathing (including washing, rinsing, drying)?: A Lot Help from another person to put on and taking off regular upper body clothing?: A Little Help from another person to put on and taking off regular lower  body clothing?: Total 6 Click Score: 14   End of Session Nurse Communication: Mobility status;Need for lift equipment  Activity Tolerance: Patient limited by pain;Patient limited by fatigue Patient left: in bed;with call bell/phone within reach;with bed alarm set;with family/visitor present  OT Visit Diagnosis: Pain;Muscle weakness (generalized) (M62.81)                Time: 0160-1093 OT Time Calculation (min): 41 min Charges:  OT General Charges $OT Visit: 1 Visit OT Evaluation $OT Eval Moderate Complexity: 1 Mod OT Treatments $Self Care/Home Management : 8-22 mins $Therapeutic Activity: 8-22 mins  [name], OTR/L Acute Rehabilitation Services Pager: [phone] Office: [phone]  [name] 02/18/2020, 11:03 AM

## 2020-02-19 LAB — COMPREHENSIVE METABOLIC PANEL
ALT: 11 U/L (ref 0–44)
AST: 15 U/L (ref 15–41)
Albumin: 3 g/dL — ABNORMAL LOW (ref 3.5–5.0)
Alkaline Phosphatase: 53 U/L (ref 38–126)
Anion gap: 11 (ref 5–15)
BUN: 48 mg/dL — ABNORMAL HIGH (ref 8–23)
CO2: 27 mmol/L (ref 22–32)
Calcium: 8.2 mg/dL — ABNORMAL LOW (ref 8.9–10.3)
Chloride: 101 mmol/L (ref 98–111)
Creatinine, Ser: 2.55 mg/dL — ABNORMAL HIGH (ref 0.44–1.00)
GFR calc Af Amer: 19 mL/min — ABNORMAL LOW (ref >60)
GFR calc non Af Amer: 16 mL/min — ABNORMAL LOW (ref >60)
Glucose, Bld: 106 mg/dL — ABNORMAL HIGH (ref 70–99)
Potassium: 3.6 mmol/L (ref 3.5–5.1)
Sodium: 139 mmol/L (ref 135–145)
Total Bilirubin: 1 mg/dL (ref 0.3–1.2)
Total Protein: 5.7 g/dL — ABNORMAL LOW (ref 6.5–8.1)

## 2020-02-19 LAB — CBC
HCT: 26.5 % — ABNORMAL LOW (ref 36.0–46.0)
Hemoglobin: 7.7 g/dL — ABNORMAL LOW (ref 12.0–15.0)
MCH: 24.9 pg — ABNORMAL LOW (ref 26.0–34.0)
MCHC: 29.1 g/dL — ABNORMAL LOW (ref 30.0–36.0)
MCV: 85.8 fL (ref 80.0–100.0)
Platelets: 143 10*3/uL — ABNORMAL LOW (ref 150–400)
RBC: 3.09 MIL/uL — ABNORMAL LOW (ref 3.87–5.11)
RDW: 20.1 % — ABNORMAL HIGH (ref 11.5–15.5)
WBC: 5.5 10*3/uL (ref 4.0–10.5)
nRBC: 0 % (ref 0.0–0.2)

## 2020-02-19 LAB — PHOSPHORUS: Phosphorus: 3.5 mg/dL (ref 2.5–4.6)

## 2020-02-19 LAB — MAGNESIUM: Magnesium: 2.2 mg/dL (ref 1.7–2.4)

## 2020-02-19 MED ORDER — HYDRALAZINE HCL 50 MG PO TABS
125.0000 mg | ORAL_TABLET | Freq: Three times a day (TID) | ORAL | Status: DC
  Administered 2020-02-19 – 2020-02-24 (×15): 125 mg via ORAL
  Filled 2020-02-19 (×11): qty 2
  Filled 2020-02-19: qty 1
  Filled 2020-02-19 (×2): qty 2
  Filled 2020-02-19: qty 1

## 2020-02-19 MED ORDER — TORSEMIDE 20 MG PO TABS
40.0000 mg | ORAL_TABLET | Freq: Two times a day (BID) | ORAL | Status: DC
  Administered 2020-02-19 – 2020-02-20 (×3): 40 mg via ORAL
  Filled 2020-02-19 (×3): qty 2

## 2020-02-19 MED ORDER — TRAZODONE HCL 50 MG PO TABS: 100.0000 mg | ORAL_TABLET | Freq: Every evening | ORAL | Status: DC | PRN

## 2020-02-19 MED ORDER — METOLAZONE 2.5 MG PO TABS
2.5000 mg | ORAL_TABLET | Freq: Once | ORAL | Status: AC
  Administered 2020-02-19: 2.5 mg via ORAL
  Filled 2020-02-19: qty 1

## 2020-02-19 MED ORDER — CLONIDINE HCL 0.2 MG PO TABS
0.2000 mg | ORAL_TABLET | Freq: Once | ORAL | Status: AC
  Administered 2020-02-19: 0.2 mg via ORAL
  Filled 2020-02-19: qty 1

## 2020-02-19 MED ORDER — HYDROCODONE-ACETAMINOPHEN 10-325 MG PO TABS
1.0000 | ORAL_TABLET | ORAL | Status: DC | PRN
  Administered 2020-02-19 – 2020-02-22 (×9): 1 via ORAL
  Filled 2020-02-19 (×9): qty 1

## 2020-02-19 MED ORDER — METOPROLOL SUCCINATE ER 25 MG PO TB24
12.5000 mg | ORAL_TABLET | Freq: Every day | ORAL | Status: DC
  Administered 2020-02-19 – 2020-02-24 (×5): 12.5 mg via ORAL
  Filled 2020-02-19 (×6): qty 1

## 2020-02-19 NOTE — Progress Notes (Signed)
PT Cancellation Note  Patient Details Name: [name] MRN: [id number] DOB: 10-07-1928   Cancelled Treatment:    Reason Eval/Treat Not Completed: Pain limiting ability to participate. Attempted PT tx session this a.m Family requested PT check back another time. Will check back if schedule allows.

## 2020-02-19 NOTE — TOC Progression Note (Signed)
Transition of Care [facility]) - Progression Note    Patient Details  Name: [name] MRN: [id number] Date of Birth: [birth year]  Transition of [facility]) CM/SW Contact  Joaquin Courts, RN Phone Number: 02/19/2020, 1:18 PM  Clinical Narrative:    [facility] HHPT and RN. Agency reports they can service patient for these disciplines but should patient need additional services such as OT, they will not have the staffing to support this.   Expected Discharge Plan: [place] Barriers to Discharge: Continued Medical Work up  Expected Discharge Plan and Services Expected Discharge Plan: [place]   Discharge Planning Services: CM Consult Post Acute Care Choice: [place] arrangements for the past 2 months: Single Family Home                 DME Arranged: N/A DME Agency: NA       HH Arranged: PT, RN [place] Agency: [place] ([facility]) Date HH Agency Contacted: 02/19/20 Time [place]: 57 Representative spoke with at [place]: [place] ([place]) Interventions    Readmission Risk Interventions Readmission Risk Prevention Plan 02/19/2020  Transportation Screening Complete  HRI or [place] Complete  Social Work Consult for [place] Planning/Counseling Complete  Palliative Care Screening Not Applicable  Medication Review [employer]) Complete  Some recent data might be hidden

## 2020-02-19 NOTE — Progress Notes (Signed)
PROGRESS NOTE  :  [name]  JHE:[id number] DOB: 29-Jul-1929 DOA: 02/16/2020 PCP: Antonietta Jewel, MD  Brief Narrative:  86 year old black female known HTN, HLD, CKD stage IIb Multiple joint replacements in the past, 5 mm distal right ureteric stone 2016,?  Hypertrophic obstructive cardiomyopathy documented back in 2016  Recently sent her sister to hospice at [facility] on 02/15/2020 Comes to the [facility] ED SLB X 2 weeks in the setting of dietary indiscretion + LE edema-given 0.4 nitro placed on oxygen Rx ED Lasix 40 IV Suspected HFpEF acute-possibly secondary to high-output heart failure from anemia She has been diuresed Because of her drop in hemoglobin we're Hemoccult in her as she is reported some loose and dark stools in the past but none recently and note that she is on iron   Assessment & Plan:   Active Problems:   Acute respiratory failure with hypoxia (HCC)   History of DVT (deep vein thrombosis)   Hypertension   Sinus bradycardia   Acute on chronic diastolic heart failure ([place])   1. Acute hypoxic respiratory failure secondary to heart failure a. Respiratory rate improved b. Overall somewhat improved still a ways to go 2. Uncontrolled hypertension a. Clonidine 0.2 mg given x1 b. Increase hydralazine scheduled to 125 3 times daily c. Add scheduled Toprol-XL 12.5 now 3. Acute HFrEF a. Severe pulmonary hypertension on echo and possibility of high-output heart failure from anemia likely secondary to renal chronic disease  b. -Home meds include torsemide 40 twice daily i. Echocardiogram shows severe pulmonary hypertension ii. IV fell out and patient refusing IV stick iii. Add Zaroxolyn 2.5 6/22 and placed back on torsemide 40 twice daily iv. Standing weight today at bedside and when I was present with 228 pounds and her baseline is 221 v. She will need to get closer to 221 pounds in the next several days carefully with review of renal panels vi. She is not ready for  discharge yet 4.  myoview 2002 no WM anomally 5. Prior DVT-continue Imdur 120 daily a.  prior DVT-looks like had non occlusive popliteal DVT 2016 and was taken off Blood thinner b. Home meds include aspirin 81 which has been held because of low hemoglobin 6. Gout a. continue allopurinol 100 twice daily 7. HLD a. continue Crestor 10 every afternoon 8. Hypothyroid a. -continue levothyroxine 75 daily b. Will need a TSH in about 3 to 4 weeks post discharge 9. CKD 3B a. Phosphorus is 3.5 b. Her kidney function is paradoxically better as patient is now on the better part of frank Starling curve she will need continued diuresis as above with close consideration to her kidney function c. Would carefully use Zaroxolyn for overload and may be will need to adjust her Imdur downwards 10. Anemia likely of renal disease a. TSAT is low-received IV iron 6/21 b. Hemoglobin has dropped a little bit more c. If she drops below 7.5 I would consider 1 unit of packed red blood cells d. last colonoscopy- apparently in 2005 without any inflammation or other findings e. Guaiac stool was negative this admission on 6/21 for blood 11. Prior ureteric stone 2016 12. Depression a. Continue hydroxyzine 50 at bedtime  DVT prophylaxis: Lovenox Code Status: Full code Family Communication: Long discussion with daughter at the bedside-patient has sitters that come in Monday Wednesday Friday Will need ongoing diuresis family is also aware I had a long chat with the daughter again on 6/22 and recommended that she replace the IV as we may need  to give her a unit of blood Disposition:   Status is: Inpatient  Remains inpatient appropriate because:Unsafe d/c plan, IV treatments appropriate due to intensity of illness or inability to take PO and Inpatient level of care appropriate due to severity of illness   Dispo: The patient is from: Home              Anticipated d/c is to: Home              Anticipated d/c date is: 3  days              Patient currently is not medically stable to d/c.  Consultants:   None  Procedures: Echocardiogram 6/20 65 to 70%. The  left ventricle has normal function. The left ventricle has no regional  wall motion abnormalities. There is severe concentric left ventricular  hypertrophy. Left ventricular diastolic  parameters are consistent with Grade II diastolic dysfunction  (pseudonormalization). Elevated left ventricular end-diastolic pressure.  The average left ventricular global longitudinal strain is -14.2 %.  2. Right ventricular systolic function is normal. The right ventricular  size is normal. There is severely elevated pulmonary artery systolic  pressure. The estimated right ventricular systolic pressure is 40.9 mmHg.  3. Left atrial size was mildly dilated.  4. Large pleural effusion in the left lateral region.  5. The mitral valve is normal in structure. Moderate mitral valve  regurgitation. No evidence of mitral stenosis.  6. The aortic valve is normal in structure. Aortic valve regurgitation is  mild. Mild to moderate aortic valve sclerosis/calcification is present,  without any evidence of aortic stenosis.  7. The inferior vena cava is normal in size with greater than 50%  respiratory variability, suggesting right atrial pressure of 3 mmHg  Antimicrobials: None   Subjective:  Set up at the side of the bed and stood on a weighing scale for us-her weight is still elevated as above She overall feels better but becomes quite debilitated with minimal movement Her daughter is insistent on her going home They have good support She is eating and drinking 100% of her meals Had a difficult IV stick on the right side and has some swelling and discomfort there and refuses to have IV placed again so I had to adjust her oral meds  Objective: Vitals:   02/19/20 0627 02/19/20 0821 02/19/20 1054 02/19/20 1100  BP: (!) 180/63 (!) 219/51 (!) 187/80   Pulse: 63 62  68   Resp: 20  (!) 24   Temp: 98 F (36.7 C)  98.8 F (37.1 C)   TempSrc: Oral  Oral   SpO2: 94%  94%   Weight:    103.6 kg  Height:        Intake/Output Summary (Last 24 hours) at 02/19/2020 1232 Last data filed at 02/19/2020 8119 Gross per 24 hour  Intake 660 ml  Output 350 ml  Net 310 ml   Filed Weights   02/17/20 0500 02/18/20 0500 02/19/20 1100  Weight: 106.1 kg 103.3 kg 103.6 kg    Examination:  General exam: EOMI NCAT Mallampati 3 JVD diminished today S1-S2 no murmur Monitors show multiple PVCs Gastrointestinal system: Obese nontender no rebound no guarding. Central nervous system: Neurologically intact Extremities: Grade 1 lower extremity edema Skin: As above no rash Psychiatry: Euthymic pleasant  Data Reviewed: I have personally reviewed following labs and imaging studies Sodium 142-->139 Potassium 3.6 BUN/creatinine down from 55/2.8-55/2.5-->52/2.6-->48/2.55 Troponins trend flat 24 through 26 Hemoglobin down from 8.7-8.0--> still 8.0-->7.7  Iron studies show iron is 28 saturation ratio is 12 Platelet is 143 Hemoccult stool is negative  Radiology Studies: No results found.   Scheduled Meds: . allopurinol  100 mg Oral BID  . colchicine  0.3 mg Oral Daily  . ferrous sulfate  325 mg Oral Q lunch  . heparin  5,000 Units Subcutaneous Q8H  . hydrALAZINE  100 mg Oral TID  . isosorbide mononitrate  120 mg Oral Daily  . levothyroxine  75 mcg Oral Daily  . rosuvastatin  10 mg Oral QPM  . sodium chloride flush  3 mL Intravenous Q12H  . torsemide  40 mg Oral BID   Continuous Infusions: . sodium chloride       LOS: 3 days    Time spent: [place], MD [facility] To contact the attending provider between 7A-7P or the covering provider during after hours 7P-7A, please log into the web site [email or web address] and access using universal [place] password for that web site. If you do not have the password, please call the hospital  operator.  02/19/2020, 12:32 PM

## 2020-02-19 NOTE — Progress Notes (Signed)
Called report to [employer].

## 2020-02-19 NOTE — Progress Notes (Signed)
Physical Therapy Treatment Patient Details Name: [name] MRN: [id number] DOB: [birth year] Today's Date: 02/19/2020    History of Present Illness 86 yo female admitted with acute on chronic heart faillure. Hx of CHF, CKD, CFT, PVD, DVT, bil TKA (wears neoprene knee braces),    PT Comments    Big improvement today! Pt was motivated to mobilize and do as much as she could for herself. O2: 85% on RA, 93% on 2L [place] with ambulation. Pt still fatigues fairly easily. Will continue to follow and progress activity as able.    Follow Up Recommendations  Home health PT;Supervision/Assistance - 24 hour     Equipment Recommendations  None recommended by PT    Recommendations for Other Services       Precautions / Restrictions Precautions Precautions: Fall Required Braces or Orthoses: Other Brace Other Brace: pt has B knee braces Restrictions Weight Bearing Restrictions: No    Mobility  Bed Mobility Overal bed mobility: Needs Assistance Bed Mobility: Supine to Sit;Sit to Supine     Supine to sit: Supervision;HOB elevated Sit to supine: Supervision;HOB elevated      Transfers Overall transfer level: Needs assistance Equipment used: Rolling walker (2 wheeled) Transfers: Sit to/from Stand Sit to Stand: Min assist         General transfer comment: Small amount of assist to slowly rise from elevated bed/chair surface. Cues for safety, hand placement.  Ambulation/Gait Ambulation/Gait assistance: Min guard Gait Distance (Feet): 15 Feet (x2) Assistive device: Rolling walker (2 wheeled) Gait Pattern/deviations: Step-through pattern;Decreased stride length     General Gait Details: Min guard for safety. Followed with recliner. Seated rest breaks needed/taken. O2 85% on RA; 93% on 2L. Dyspnea 2/4.   Stairs             Wheelchair Mobility    Modified Rankin (Stroke Patients Only)       Balance Overall balance assessment: Needs assistance         Standing  balance support: Bilateral upper extremity supported Standing balance-Leahy Scale: Poor                              Cognition Arousal/Alertness: Awake/alert Behavior During Therapy: WFL for tasks assessed/performed Overall Cognitive Status: Within Functional Limits for tasks assessed                                        Exercises      General Comments        Pertinent Vitals/Pain Pain Assessment: Faces Faces Pain Scale: Hurts a little bit Pain Location: bil LEs/feet Pain Descriptors / Indicators: Aching;Discomfort;Sore Pain Intervention(s): Monitored during session    Home Living                      Prior Function            PT Goals (current goals can now be found in the care plan section) Progress towards PT goals: Progressing toward goals    Frequency    Min 3X/week      PT Plan Current plan remains appropriate    Co-evaluation              AM-PAC PT "6 Clicks" Mobility   Outcome Measure  Help needed turning from your back to your side while in a flat bed without using bedrails?:  A Little Help needed moving from lying on your back to sitting on the side of a flat bed without using bedrails?: A Little Help needed moving to and from a bed to a chair (including a wheelchair)?: A Little Help needed standing up from a chair using your arms (e.g., wheelchair or bedside chair)?: A Little Help needed to walk in hospital room?: A Little Help needed climbing 3-5 steps with a railing? : A Lot 6 Click Score: 17    End of Session Equipment Utilized During Treatment: Gait belt;Oxygen Activity Tolerance: Patient limited by fatigue Patient left: in bed;with call bell/phone within reach;with bed alarm set;with family/visitor present   PT Visit Diagnosis: Difficulty in walking, not elsewhere classified (R26.2);Muscle weakness (generalized) (M62.81);Pain     Time: 0349-6116 PT Time Calculation (min) (ACUTE ONLY): 20  min  Charges:  $Gait Training: 8-22 mins                         Doreatha Massed, PT Acute Rehabilitation  Office: [phone] Pager: [phone]

## 2020-02-20 DIAGNOSIS — I10 Essential (primary) hypertension: Secondary | ICD-10-CM

## 2020-02-20 DIAGNOSIS — J9601 Acute respiratory failure with hypoxia: Secondary | ICD-10-CM

## 2020-02-20 DIAGNOSIS — Z86718 Personal history of other venous thrombosis and embolism: Secondary | ICD-10-CM

## 2020-02-20 LAB — CBC
HCT: 29.2 % — ABNORMAL LOW (ref 36.0–46.0)
Hemoglobin: 8.5 g/dL — ABNORMAL LOW (ref 12.0–15.0)
MCH: 25.4 pg — ABNORMAL LOW (ref 26.0–34.0)
MCHC: 29.1 g/dL — ABNORMAL LOW (ref 30.0–36.0)
MCV: 87.4 fL (ref 80.0–100.0)
Platelets: 152 10*3/uL (ref 150–400)
RBC: 3.34 MIL/uL — ABNORMAL LOW (ref 3.87–5.11)
RDW: 20.1 % — ABNORMAL HIGH (ref 11.5–15.5)
WBC: 5.8 10*3/uL (ref 4.0–10.5)
nRBC: 0 % (ref 0.0–0.2)

## 2020-02-20 LAB — BASIC METABOLIC PANEL
Anion gap: 12 (ref 5–15)
BUN: 46 mg/dL — ABNORMAL HIGH (ref 8–23)
CO2: 28 mmol/L (ref 22–32)
Calcium: 8.3 mg/dL — ABNORMAL LOW (ref 8.9–10.3)
Chloride: 98 mmol/L (ref 98–111)
Creatinine, Ser: 2.97 mg/dL — ABNORMAL HIGH (ref 0.44–1.00)
GFR calc Af Amer: 16 mL/min — ABNORMAL LOW (ref >60)
GFR calc non Af Amer: 13 mL/min — ABNORMAL LOW (ref >60)
Glucose, Bld: 118 mg/dL — ABNORMAL HIGH (ref 70–99)
Potassium: 3.8 mmol/L (ref 3.5–5.1)
Sodium: 138 mmol/L (ref 135–145)

## 2020-02-20 NOTE — Care Management Important Message (Signed)
Important Message  Patient Details IM Letter given to Marney Doctor RN Case Manager to present to the Patient Name: [name] MRN: [id number] Date of Birth: 1928/11/14   Medicare Important Message Given:  Yes     Kerin Salen 02/20/2020, 9:31 AM

## 2020-02-20 NOTE — Progress Notes (Signed)
PROGRESS NOTE    [name]  SEG:[id number] DOB: [birth year] DOA: 02/16/2020 PCP: Antonietta Jewel, MD   Brief Narrative: [name] is a 86 y.o. female with a history of hypertension, CKD stage IIIb, hypertrophic obstructive cardiomyopathy. Patient presented secondary to worsening LE edema in setting of acute heart failure. Patient was started on IV diuresis with improvement of symptoms. Currently being weaned to room air. Creatinine worsening with diuresis.   Assessment & Plan:   Active Problems:   Acute respiratory failure with hypoxia (HCC)   History of DVT (deep vein thrombosis)   Hypertension   Sinus bradycardia   Acute on chronic diastolic heart failure ([place])   Acute on chronic diastolic heart failure Patient diuresed with Lasix IV with good result. Patient with a net output of 2 L documented and weight decrease of 3.5 kg. Transitioned to torsemide PO on 6/22. Unfortunately, creatinine has started to worsen after continued lasix and a dose of Zaroxolyn and torsemide. Respiratory status is stable. -Discontinue torsemide -Continue Fluid restricted diet -Strict in and out  Acute respiratory failure with hypoxia Patient's hypoxia has improved with diuresis. She still reports some dyspnea on exertion but feels better from admission. -Wean to room air  Hypertension -Continue Imdur and hydralazine  History of DVT Noted. Not on anticoagulation  Gout -Continue allopurinol  Hyperlipidemia -Continue Crestor  Hypothyroidism -Continue Synthroid 75 mcg daily  AKI on CKD stage IV Difficult to ascertain baseline. Appears patient most likely has CKD stage IV. Currently worsening, however, with diuresis. -Hold diuretics -BMP in AM  Anemia of chronic disease In setting of kidney disease. Received IV iron. Hemoglobin currently stable.   DVT prophylaxis: Heparin subq Code Status:   Code Status: Full Code Family Communication: None at bedside Disposition Plan: Discharge  home when weaned to room air, in addition to creatinine stabilized around baseline   Consultants:   None  Procedures:   TRANSTHORACIC ECHOCARDIOGRAM (02/17/2020) IMPRESSIONS    1. Left ventricular ejection fraction, by estimation, is 65 to 70%. The  left ventricle has normal function. The left ventricle has no regional  wall motion abnormalities. There is severe concentric left ventricular  hypertrophy. Left ventricular diastolic  parameters are consistent with Grade II diastolic dysfunction  (pseudonormalization). Elevated left ventricular end-diastolic pressure.  The average left ventricular global longitudinal strain is -14.2 %.  2. Right ventricular systolic function is normal. The right ventricular  size is normal. There is severely elevated pulmonary artery systolic  pressure. The estimated right ventricular systolic pressure is 73.7 mmHg.  3. Left atrial size was mildly dilated.  4. Large pleural effusion in the left lateral region.  5. The mitral valve is normal in structure. Moderate mitral valve  regurgitation. No evidence of mitral stenosis.  6. The aortic valve is normal in structure. Aortic valve regurgitation is  mild. Mild to moderate aortic valve sclerosis/calcification is present,  without any evidence of aortic stenosis.  7. The inferior vena cava is normal in size with greater than 50%  respiratory variability, suggesting right atrial pressure of 3 mmHg.  Antimicrobials:  None    Subjective: Some dyspnea on exertion, otherwise dyspnea is improved.  Objective: Vitals:   02/20/20 0551 02/20/20 0836 02/20/20 1357 02/20/20 1545  BP:  (!) 157/58 (!) 185/58   Pulse:  (!) 56 (!) 55   Resp:  20 20   Temp:  97.9 F (36.6 C) 98.3 F (36.8 C)   TempSrc:  Oral Oral   SpO2:  100% 96% 92%  Weight: 102.6 kg     Height:        Intake/Output Summary (Last 24 hours) at 02/20/2020 1819 Last data filed at 02/20/2020 0831 Gross per 24 hour  Intake 0 ml    Output 600 ml  Net -600 ml   Filed Weights   02/18/20 0500 02/19/20 1100 02/20/20 0551  Weight: 103.3 kg 103.6 kg 102.6 kg    Examination:  General exam: Appears calm and comfortable Respiratory system: Clear to auscultation. Respiratory effort normal. Cardiovascular system: S1 & S2 heard, RRR. Gastrointestinal system: Abdomen is nondistended, soft and nontender. No organomegaly or masses felt. Normal bowel sounds heard. Central nervous system: Alert and oriented. No focal neurological deficits. Musculoskeletal: 2+ LE edema. No calf tenderness Skin: No cyanosis. No rashes Psychiatry: Judgement and insight appear normal. Mood & affect appropriate.     Data Reviewed: I have personally reviewed following labs and imaging studies  CBC Lab Results  Component Value Date   WBC 5.8 02/20/2020   RBC 3.34 (L) 02/20/2020   HGB 8.5 (L) 02/20/2020   HCT 29.2 (L) 02/20/2020   MCV 87.4 02/20/2020   MCH 25.4 (L) 02/20/2020   PLT 152 02/20/2020   MCHC 29.1 (L) 02/20/2020   RDW 20.1 (H) 02/20/2020   LYMPHSABS 2.6 05/02/2018   MONOABS 0.5 05/02/2018   EOSABS 0.1 05/02/2018   BASOSABS 0.0 22/97/9892     Last metabolic panel Lab Results  Component Value Date   NA 138 02/20/2020   K 3.8 02/20/2020   CL 98 02/20/2020   CO2 28 02/20/2020   BUN 46 (H) 02/20/2020   CREATININE 2.97 (H) 02/20/2020   GLUCOSE 118 (H) 02/20/2020   GFRNONAA 13 (L) 02/20/2020   GFRAA 16 (L) 02/20/2020   CALCIUM 8.3 (L) 02/20/2020   PHOS 3.5 02/19/2020   PROT 5.7 (L) 02/19/2020   ALBUMIN 3.0 (L) 02/19/2020   LABGLOB 2.8 01/30/2018   AGRATIO 1.4 01/30/2018   BILITOT 1.0 02/19/2020   ALKPHOS 53 02/19/2020   AST 15 02/19/2020   ALT 11 02/19/2020   ANIONGAP 12 02/20/2020    CBG (last 3)  No results for input(s): GLUCAP in the last 72 hours.   GFR: Estimated Creatinine Clearance: 15.5 mL/min (A) (by C-G formula based on SCr of 2.97 mg/dL (H)).  Coagulation Profile: No results for input(s): INR,  PROTIME in the last 168 hours.  No results found for this or any previous visit (from the past 240 hour(s)).      Radiology Studies: No results found.      Scheduled Meds: . allopurinol  100 mg Oral BID  . colchicine  0.3 mg Oral Daily  . ferrous sulfate  325 mg Oral Q lunch  . heparin  5,000 Units Subcutaneous Q8H  . hydrALAZINE  125 mg Oral TID  . isosorbide mononitrate  120 mg Oral Daily  . levothyroxine  75 mcg Oral Daily  . metoprolol succinate  12.5 mg Oral Daily  . rosuvastatin  10 mg Oral QPM  . sodium chloride flush  3 mL Intravenous Q12H  . torsemide  40 mg Oral BID   Continuous Infusions: . sodium chloride       LOS: 4 days     Cordelia Poche, MD [facility] 02/20/2020, 6:19 PM  If 7PM-7AM, please contact night-coverage [email or web address]

## 2020-02-20 NOTE — Progress Notes (Signed)
Occupational Therapy Treatment Patient Details Name: [name] MRN: [id number] DOB: Sep 26, 1928 Today's Date: 02/20/2020    History of present illness 86 yo female admitted with acute on chronic heart faillure. Hx of CHF, CKD, CFT, PVD, DVT, bil TKA (wears neoprene knee braces),   OT comments  Treatment focused on improving functional mobility and participation in ADLs in order to return home at discharge as is her goal. Patient demonstrated improved ability to perform bed transfers, stand and ambulate to the sink to perform grooming task. Patient's o2 sat dropped to 82% on 1 liter and required 2 L HFNC to recover. Patient positioned in recliner and therapist instructed patient to perform exercises in the chair in order maintain ROM and strength and then elevated legs after 30 minutes. Patient verbalized understanding. Continue POC.    Follow Up Recommendations  Home health OT;Supervision/Assistance - 24 hour    Equipment Recommendations  None recommended by OT    Recommendations for Other Services      Precautions / Restrictions Precautions Precautions: Fall Required Braces or Orthoses: Other Brace Other Brace: pt has B knee braces       Mobility Bed Mobility Overal bed mobility: Needs Assistance Bed Mobility: Supine to Sit;Sit to Supine     Supine to sit: Supervision;HOB elevated        Transfers Overall transfer level: Needs assistance Equipment used: Rolling walker (2 wheeled) Transfers: Sit to/from Stand Sit to Stand: Min guard Stand pivot transfers: Min guard       General transfer comment: Min guard to ambulate to sink with RW on 1 L.    Balance                                           ADL either performed or assessed with clinical judgement   ADL       Grooming: Wash/dry hands;Standing;Min guard Grooming Details (indicate cue type and reason): Patient stood at sink to perform hand washing with min guard. Patient on 1 L HFNC and  o2 sat dropped to 82%. Placed on 2L.                                     Vision       Perception     Praxis      Cognition Arousal/Alertness: Awake/alert Behavior During Therapy: WFL for tasks assessed/performed Overall Cognitive Status: Within Functional Limits for tasks assessed                                          Exercises     Shoulder Instructions       General Comments      Pertinent Vitals/ Pain       Pain Assessment: No/denies pain  Home Living                                          Prior Functioning/Environment              Frequency  Min 2X/week        Progress Toward Goals  OT Goals(current goals can now  be found in the care plan section)  Progress towards OT goals: Progressing toward goals  Acute Rehab OT Goals Patient Stated Goal: home OT Goal Formulation: With patient Time For Goal Achievement: 03/03/20 Potential to Achieve Goals: Good  Plan Discharge plan remains appropriate    Co-evaluation                 AM-PAC OT "6 Clicks" Daily Activity     Outcome Measure   Help from another person eating meals?: None Help from another person taking care of personal grooming?: A Little Help from another person toileting, which includes using toliet, bedpan, or urinal?: A Lot Help from another person bathing (including washing, rinsing, drying)?: A Lot Help from another person to put on and taking off regular upper body clothing?: A Little Help from another person to put on and taking off regular lower body clothing?: A Lot 6 Click Score: 16    End of Session Equipment Utilized During Treatment: Rolling walker  OT Visit Diagnosis: Pain;Muscle weakness (generalized) (M62.81)   Activity Tolerance Patient tolerated treatment well   Patient Left with call bell/phone within reach;with bed alarm set;with family/visitor present;in chair   Nurse Communication  (okay to see per  nursing)        Time: 8338-2505 OT Time Calculation (min): 16 min  Charges: OT General Charges $OT Visit: 1 Visit OT Treatments $Therapeutic Activity: 8-22 mins  Hodge Stachnik, OTR/L [place]  Office [phone] Pager: [phone]    Lenward Chancellor 02/20/2020, 5:24 PM

## 2020-02-20 NOTE — Progress Notes (Signed)
Nutrition Education Note  RD consulted for nutrition education regarding low sodium diet.  Pt in room with daughter at bedside. Pt's daughter reports pt has not been eating as well as she should lately with family bringing her meals they prepared and pt receiving Meals on Wheels (which should be heart healthy). Family just wants pt's diet to get back on track. States they have been watching her sodium for years. Appreciative of information provided.  RD provided "Low Sodium Nutrition Therapy" handout from the Academy of [employer]. Reviewed patient's dietary recall. Provided examples on ways to decrease sodium intake in diet. Discouraged intake of processed foods and use of salt shaker. Encouraged fresh fruits and vegetables as well as whole grain sources of carbohydrates to maximize fiber intake.   RD discussed why it is important for patient to adhere to diet recommendations, and emphasized the role of fluids, foods to avoid, and importance of weighing self daily. Teach back method used.  Expect good compliance with family support.  Body mass index is 37.64 kg/m. Pt meets criteria for obesity based on current BMI.  Current diet order is renal, patient is consuming approximately 100% of meals at this time. Labs and medications reviewed. No further nutrition interventions warranted at this time.  If additional nutrition issues arise, please re-consult RD.   Clayton Bibles, MS, RD, LDN Inpatient Clinical Dietitian Contact information available via Amion

## 2020-02-21 ENCOUNTER — Inpatient Hospital Stay (HOSPITAL_COMMUNITY): Payer: Medicare Other

## 2020-02-21 LAB — BASIC METABOLIC PANEL
Anion gap: 7 (ref 5–15)
BUN: 46 mg/dL — ABNORMAL HIGH (ref 8–23)
CO2: 33 mmol/L — ABNORMAL HIGH (ref 22–32)
Calcium: 8.6 mg/dL — ABNORMAL LOW (ref 8.9–10.3)
Chloride: 98 mmol/L (ref 98–111)
Creatinine, Ser: 3.15 mg/dL — ABNORMAL HIGH (ref 0.44–1.00)
GFR calc Af Amer: 15 mL/min — ABNORMAL LOW (ref >60)
GFR calc non Af Amer: 13 mL/min — ABNORMAL LOW (ref >60)
Glucose, Bld: 101 mg/dL — ABNORMAL HIGH (ref 70–99)
Potassium: 3.6 mmol/L (ref 3.5–5.1)
Sodium: 138 mmol/L (ref 135–145)

## 2020-02-21 NOTE — Progress Notes (Addendum)
PROGRESS NOTE    [name]  XVQ:[id number] DOB: 1929-05-18 DOA: 02/16/2020 PCP: Antonietta Jewel, MD   Brief Narrative: [name] is a 86 y.o. female with a history of hypertension, CKD stage IIIb, hypertrophic obstructive cardiomyopathy. Patient presented secondary to worsening LE edema in setting of acute heart failure. Patient was started on IV diuresis with improvement of symptoms. Currently being weaned to room air. Creatinine worsening with diuresis.   Assessment & Plan:   Active Problems:   Acute respiratory failure with hypoxia (HCC)   History of DVT (deep vein thrombosis)   Hypertension   Sinus bradycardia   Acute on chronic diastolic heart failure ([place])   Acute on chronic diastolic heart failure Patient diuresed with Lasix IV with good result. Patient with a net output of 2 L documented and weight decrease of 3.5 kg. Transitioned to torsemide PO on 6/22. Unfortunately, creatinine has started to worsen after continued lasix and a dose of Zaroxolyn and torsemide. Respiratory status is stable. -Discontinue torsemide -Continue Fluid restricted diet -Strict in and out  Acute respiratory failure with hypoxia Patient's hypoxia has improved with diuresis. She still reports some dyspnea on exertion but feels better from admission. -Wean to room air -Chest x-ray  Alkalosis Mild. In setting of diuresis and possibly contraction alkalosis. -Watch BMP  Hypertension -Continue Imdur and hydralazine  History of DVT Noted. Not on anticoagulation  Gout -Discontinue allopurinol secondary to renal function  Hyperlipidemia -Continue Crestor  Hypothyroidism -Continue Synthroid 75 mcg daily  AKI on CKD stage IV Difficult to ascertain baseline. Appears patient most likely has CKD stage IV. Currently worsening, however, with diuresis. Appears rate of climb has decreased. Patient follows with Dr. Moshe Cipro as an outpatient -Hold diuretics -BMP in AM -Watch urine output  carefully  Anemia of chronic disease In setting of kidney disease. Received IV iron. Hemoglobin currently stable.   DVT prophylaxis: Heparin subq Code Status:   Code Status: Full Code Family Communication: Daughter at bedside Disposition Plan: Discharge home when weaned to room air, in addition to creatinine stabilized around baseline    Consultants:   None  Procedures:   TRANSTHORACIC ECHOCARDIOGRAM (02/17/2020) IMPRESSIONS    1. Left ventricular ejection fraction, by estimation, is 65 to 70%. The  left ventricle has normal function. The left ventricle has no regional  wall motion abnormalities. There is severe concentric left ventricular  hypertrophy. Left ventricular diastolic  parameters are consistent with Grade II diastolic dysfunction  (pseudonormalization). Elevated left ventricular end-diastolic pressure.  The average left ventricular global longitudinal strain is -14.2 %.  2. Right ventricular systolic function is normal. The right ventricular  size is normal. There is severely elevated pulmonary artery systolic  pressure. The estimated right ventricular systolic pressure is 09.3 mmHg.  3. Left atrial size was mildly dilated.  4. Large pleural effusion in the left lateral region.  5. The mitral valve is normal in structure. Moderate mitral valve  regurgitation. No evidence of mitral stenosis.  6. The aortic valve is normal in structure. Aortic valve regurgitation is  mild. Mild to moderate aortic valve sclerosis/calcification is present,  without any evidence of aortic stenosis.  7. The inferior vena cava is normal in size with greater than 50%  respiratory variability, suggesting right atrial pressure of 3 mmHg.  Antimicrobials:  None    Subjective: Dyspnea on exertion. Urinating. No other issues.  Objective: Vitals:   02/20/20 1545 02/20/20 2026 02/21/20 0533 02/21/20 1406  BP:  (!) 181/60 (!) 158/54 (!) 142/44  Pulse:  (!) 57 (!) 54 (!) 59    Resp:  18 16 15   Temp:  99.1 F (37.3 C) 98.6 F (37 C) 98.4 F (36.9 C)  TempSrc:  Oral Oral Oral  SpO2: 92% 95% 92% 96%  Weight:      Height:        Intake/Output Summary (Last 24 hours) at 02/21/2020 1439 Last data filed at 02/21/2020 1000 Gross per 24 hour  Intake 240 ml  Output 800 ml  Net -560 ml   Filed Weights   02/18/20 0500 02/19/20 1100 02/20/20 0551  Weight: 103.3 kg 103.6 kg 102.6 kg    Examination:  General exam: Appears calm and comfortable Respiratory system: Rales at bases and up right side to mid-lung. Respiratory effort normal. Cardiovascular system: S1 & S2 heard, RRR. No murmurs, rubs, gallops or clicks. Gastrointestinal system: Abdomen is nondistended, soft and nontender. No organomegaly or masses felt. Normal bowel sounds heard. Central nervous system: Alert and oriented. No focal neurological deficits. Musculoskeletal: No calf tenderness Skin: No cyanosis. No rashes Psychiatry: Judgement and insight appear normal. Mood & affect appropriate.    Data Reviewed: I have personally reviewed following labs and imaging studies  CBC Lab Results  Component Value Date   WBC 5.8 02/20/2020   RBC 3.34 (L) 02/20/2020   HGB 8.5 (L) 02/20/2020   HCT 29.2 (L) 02/20/2020   MCV 87.4 02/20/2020   MCH 25.4 (L) 02/20/2020   PLT 152 02/20/2020   MCHC 29.1 (L) 02/20/2020   RDW 20.1 (H) 02/20/2020   LYMPHSABS 2.6 05/02/2018   MONOABS 0.5 05/02/2018   EOSABS 0.1 05/02/2018   BASOSABS 0.0 64/40/3474     Last metabolic panel Lab Results  Component Value Date   NA 138 02/21/2020   K 3.6 02/21/2020   CL 98 02/21/2020   CO2 33 (H) 02/21/2020   BUN 46 (H) 02/21/2020   CREATININE 3.15 (H) 02/21/2020   GLUCOSE 101 (H) 02/21/2020   GFRNONAA 13 (L) 02/21/2020   GFRAA 15 (L) 02/21/2020   CALCIUM 8.6 (L) 02/21/2020   PHOS 3.5 02/19/2020   PROT 5.7 (L) 02/19/2020   ALBUMIN 3.0 (L) 02/19/2020   LABGLOB 2.8 01/30/2018   AGRATIO 1.4 01/30/2018   BILITOT 1.0  02/19/2020   ALKPHOS 53 02/19/2020   AST 15 02/19/2020   ALT 11 02/19/2020   ANIONGAP 7 02/21/2020    CBG (last 3)  No results for input(s): GLUCAP in the last 72 hours.   GFR: Estimated Creatinine Clearance: 14.7 mL/min (A) (by C-G formula based on SCr of 3.15 mg/dL (H)).  Coagulation Profile: No results for input(s): INR, PROTIME in the last 168 hours.  No results found for this or any previous visit (from the past 240 hour(s)).      Radiology Studies: No results found.      Scheduled Meds: . allopurinol  100 mg Oral BID  . colchicine  0.3 mg Oral Daily  . ferrous sulfate  325 mg Oral Q lunch  . heparin  5,000 Units Subcutaneous Q8H  . hydrALAZINE  125 mg Oral TID  . isosorbide mononitrate  120 mg Oral Daily  . levothyroxine  75 mcg Oral Daily  . metoprolol succinate  12.5 mg Oral Daily  . rosuvastatin  10 mg Oral QPM  . sodium chloride flush  3 mL Intravenous Q12H   Continuous Infusions: . sodium chloride       LOS: 5 days     Cordelia Poche, MD [facility]  02/21/2020, 2:39 PM  If 7PM-7AM, please contact night-coverage [email or web address]

## 2020-02-21 NOTE — Progress Notes (Signed)
Physical Therapy Treatment Patient Details Name: [name] MRN: [id number] DOB: [birth year] Today's Date: 02/21/2020    History of Present Illness 86 yo female admitted with acute on chronic heart faillure. Hx of CHF, CKD, CFT, PVD, DVT, bil TKA (wears neoprene knee braces),    PT Comments    SATURATION QUALIFICATIONS: (This note is used to comply with regulatory documentation for home oxygen)  Patient Saturations on Room Air at Rest = 91%  Patient Saturations on Room Air while Ambulating 16 feet = 87%  Patient Saturations on 2 Liters of oxygen while Ambulating = 90%  Please briefly explain why patient needs home oxygen:  Pt required supplemental oxygen during activity to achieve therapeutic level.  General transfer comment: pt was able to perform sit to stand at Supervision level from both recliner and elevated toilet.  Good safety cognition and use of hands to steady self.  Also able to perform self peri care in standing after void.  Slow but steady.General Gait Details: Pt was able to amb to and from the bathroom at Supervision leval with walker.  Did NOT have her knee brases on when in recliner.  Did well.  Minimal c/o LE pain (chronic).  Amb on RA avg sats 87% with 2/4 dyspnea.  Instructed on purse lip breathing with emphasis on "blowing out" as her CO2 measures 33. Extended serated rest break was needed.  Instructed on energy condervation tech and awareness.  Pt is able to know when she gets "winded".  Pt is slow moving but steady.  Still requires supplemental oxygen but improving.  Pt plans to return home alone with previous services and family support.    Follow Up Recommendations  Home health PT;Supervision/Assistance - 24 hour (resume meals on wheels and HH Aide 3/wk)     Equipment Recommendations  None recommended by PT (has a walker, has an elevated toilet seat, has a lift chair)    Recommendations for Other Services      Precautions / Restrictions  Precautions Precautions: Fall Required Braces or Orthoses: Other Brace Other Brace: pt has B knee braces    Mobility  Bed Mobility               General bed mobility comments: pt was OOB in recliner  Transfers Overall transfer level: Needs assistance Equipment used: Rolling walker (2 wheeled) Transfers: Sit to/from Stand Sit to Stand: Supervision Stand pivot transfers: Supervision       General transfer comment: pt was able to perform sit to stand at Supervision level from both recliner and elevated toilet.  Good safety cognition and use of hands to steady self.  Also able to perform self peri care in standing after void.  Slow but steady.  Ambulation/Gait Ambulation/Gait assistance: Supervision Gait Distance (Feet): 16 Feet Assistive device: Rolling walker (2 wheeled) Gait Pattern/deviations: Step-through pattern;Decreased stride length Gait velocity: decreased   General Gait Details: Pt was able to amb to and from the bathroom at Supervision leval with walker.  Did NOT have her knee brases on when in recliner.  Did well.  Minimal c/o LE pain (chronic).  Amb on RA avg sats 87% with 2/4 dyspnea.  Instructed on purse lip breathing with emphasis on "blowing out" as her CO2 measures 33. Extended serated rest break was needed.  Instructed on energy condervation tech and awareness.  Pt is able to know when she gets "winded".  Pt is slow moving but steady.   Stairs  Wheelchair Mobility    Modified Rankin (Stroke Patients Only)       Balance                                            Cognition   Behavior During Therapy: WFL for tasks assessed/performed Overall Cognitive Status: Within Functional Limits for tasks assessed                                 General Comments: AxO x 3 pleasant      Exercises      General Comments        Pertinent Vitals/Pain Pain Assessment: Faces Faces Pain Scale: Hurts a little  bit Pain Location: bil LEs/feet Pain Descriptors / Indicators: Aching;Discomfort;Sore Pain Intervention(s): Monitored during session    Home Living                      Prior Function            PT Goals (current goals can now be found in the care plan section) Progress towards PT goals: Progressing toward goals    Frequency    Min 3X/week      PT Plan Current plan remains appropriate    Co-evaluation              AM-PAC PT "6 Clicks" Mobility   Outcome Measure  Help needed turning from your back to your side while in a flat bed without using bedrails?: None Help needed moving from lying on your back to sitting on the side of a flat bed without using bedrails?: None Help needed moving to and from a bed to a chair (including a wheelchair)?: None Help needed standing up from a chair using your arms (e.g., wheelchair or bedside chair)?: None Help needed to walk in hospital room?: A Little Help needed climbing 3-5 steps with a railing? : A Little 6 Click Score: 22    End of Session Equipment Utilized During Treatment: Gait belt Activity Tolerance: Patient limited by fatigue;Other (comment) (CHF) Patient left: in chair;with call bell/phone within reach;with family/visitor present Nurse Communication: Mobility status PT Visit Diagnosis: Difficulty in walking, not elsewhere classified (R26.2);Muscle weakness (generalized) (M62.81);Pain     Time: 8185-6314 PT Time Calculation (min) (ACUTE ONLY): 32 min  Charges:  $Gait Training: 8-22 mins $Therapeutic Activity: 8-22 mins                     Rica Koyanagi  PTA Acute  Rehabilitation Services Pager      [phone] Office      [phone]

## 2020-02-21 NOTE — Care Management Important Message (Signed)
Important Message  Patient Details IM Letter given to Marney Doctor RN Case Manager to present to the Patient Name: [name] MRN: [id number] Date of Birth: May 21, 1929   Medicare Important Message Given:  Yes     Kerin Salen 02/21/2020, 9:46 AM

## 2020-02-22 LAB — CBC
HCT: 28 % — ABNORMAL LOW (ref 36.0–46.0)
Hemoglobin: 8.2 g/dL — ABNORMAL LOW (ref 12.0–15.0)
MCH: 25.3 pg — ABNORMAL LOW (ref 26.0–34.0)
MCHC: 29.3 g/dL — ABNORMAL LOW (ref 30.0–36.0)
MCV: 86.4 fL (ref 80.0–100.0)
Platelets: 146 10*3/uL — ABNORMAL LOW (ref 150–400)
RBC: 3.24 MIL/uL — ABNORMAL LOW (ref 3.87–5.11)
RDW: 20 % — ABNORMAL HIGH (ref 11.5–15.5)
WBC: 6 10*3/uL (ref 4.0–10.5)
nRBC: 0 % (ref 0.0–0.2)

## 2020-02-22 LAB — URINALYSIS, ROUTINE W REFLEX MICROSCOPIC
Bilirubin Urine: NEGATIVE
Glucose, UA: NEGATIVE mg/dL
Hgb urine dipstick: NEGATIVE
Ketones, ur: NEGATIVE mg/dL
Leukocytes,Ua: NEGATIVE
Nitrite: NEGATIVE
Protein, ur: >=300 mg/dL — AB
Specific Gravity, Urine: 1.014 (ref 1.005–1.030)
pH: 6 (ref 5.0–8.0)

## 2020-02-22 LAB — BASIC METABOLIC PANEL
Anion gap: 13 (ref 5–15)
BUN: 51 mg/dL — ABNORMAL HIGH (ref 8–23)
CO2: 26 mmol/L (ref 22–32)
Calcium: 8.2 mg/dL — ABNORMAL LOW (ref 8.9–10.3)
Chloride: 98 mmol/L (ref 98–111)
Creatinine, Ser: 3.47 mg/dL — ABNORMAL HIGH (ref 0.44–1.00)
GFR calc Af Amer: 13 mL/min — ABNORMAL LOW (ref >60)
GFR calc non Af Amer: 11 mL/min — ABNORMAL LOW (ref >60)
Glucose, Bld: 98 mg/dL (ref 70–99)
Potassium: 3.8 mmol/L (ref 3.5–5.1)
Sodium: 137 mmol/L (ref 135–145)

## 2020-02-22 MED ORDER — HYDROCODONE-ACETAMINOPHEN 10-325 MG PO TABS
1.0000 | ORAL_TABLET | ORAL | Status: DC | PRN
  Administered 2020-02-22 – 2020-02-24 (×6): 2 via ORAL
  Filled 2020-02-22 (×6): qty 2

## 2020-02-22 MED ORDER — DARBEPOETIN ALFA 100 MCG/0.5ML IJ SOSY
100.0000 ug | PREFILLED_SYRINGE | INTRAMUSCULAR | Status: DC
  Filled 2020-02-22: qty 0.5

## 2020-02-22 MED ORDER — SODIUM CHLORIDE 0.9 % IV SOLN
510.0000 mg | Freq: Once | INTRAVENOUS | Status: AC
  Administered 2020-02-22: 510 mg via INTRAVENOUS
  Filled 2020-02-22: qty 510

## 2020-02-22 NOTE — Progress Notes (Signed)
PROGRESS NOTE    [name]  ZHY:[id number] DOB: [birth year] DOA: 02/16/2020 PCP: Antonietta Jewel, MD   Brief Narrative: [name] is a 86 y.o. female with a history of hypertension, CKD stage IIIb, hypertrophic obstructive cardiomyopathy. Patient presented secondary to worsening LE edema in setting of acute heart failure. Patient was started on IV diuresis with improvement of symptoms. Currently being weaned to room air. Creatinine worsening with diuresis.   Assessment & Plan:   Active Problems:   Acute respiratory failure with hypoxia (HCC)   History of DVT (deep vein thrombosis)   Hypertension   Sinus bradycardia   Acute on chronic diastolic heart failure ([place])   Acute on chronic diastolic heart failure Patient diuresed with Lasix IV with good result. Patient with a net output of 2 L documented and weight decrease of 3.5 kg. Transitioned to torsemide PO on 6/22. Unfortunately, creatinine has started to worsen after continued lasix and a dose of Zaroxolyn and torsemide. Respiratory status is stable. Diuretics held secondary to worsening AKI. Still some rales/interstitial edema on imaging however, patient is stable on room air. -Continue Fluid restricted diet -Strict in and out  AKI on CKD stage IV Difficult to ascertain baseline. Appears patient most likely has CKD stage IV. Currently worsening, however, with diuresis. Worsened creatinine overnight. -Nephrology consult/recommendations: continue to hold diuretics. urinalysis -Hold diuretics -BMP in AM -Watch urine output carefully  Acute respiratory failure with hypoxia Patient's hypoxia has improved with diuresis. She still reports some dyspnea on exertion but feels better from admission. Weaned to room air. Resolved.  Alkalosis Mild. In setting of diuresis and possibly contraction alkalosis. Resolved.  Hypertension -Continue Imdur and hydralazine. Per nephrology, recommendation for no tight control  History of  DVT Noted. Not on anticoagulation  Gout -Discontinued allopurinol secondary to renal function  Hyperlipidemia -Continue Crestor  Hypothyroidism -Continue Synthroid 75 mcg daily  Anemia of chronic disease In setting of kidney disease. Received IV iron. Hemoglobin currently stable.  Right sided pleural effusion Large on Transthoracic Echocardiogram, small on x-ray imaging. Patient is on room air -Monitor for now   DVT prophylaxis: Heparin subq Code Status:   Code Status: Full Code Family Communication: Daughter at bedside Disposition Plan: Discharge home pending improvement in creatinine, nephrology recommendations. Anticipate at least 2-4 days   Consultants:   Nephrology  Procedures:   TRANSTHORACIC ECHOCARDIOGRAM (02/17/2020) IMPRESSIONS    1. Left ventricular ejection fraction, by estimation, is 65 to 70%. The  left ventricle has normal function. The left ventricle has no regional  wall motion abnormalities. There is severe concentric left ventricular  hypertrophy. Left ventricular diastolic  parameters are consistent with Grade II diastolic dysfunction  (pseudonormalization). Elevated left ventricular end-diastolic pressure.  The average left ventricular global longitudinal strain is -14.2 %.  2. Right ventricular systolic function is normal. The right ventricular  size is normal. There is severely elevated pulmonary artery systolic  pressure. The estimated right ventricular systolic pressure is 29.5 mmHg.  3. Left atrial size was mildly dilated.  4. Large pleural effusion in the left lateral region.  5. The mitral valve is normal in structure. Moderate mitral valve  regurgitation. No evidence of mitral stenosis.  6. The aortic valve is normal in structure. Aortic valve regurgitation is  mild. Mild to moderate aortic valve sclerosis/calcification is present,  without any evidence of aortic stenosis.  7. The inferior vena cava is normal in size with  greater than 50%  respiratory variability, suggesting right atrial pressure of 3 mmHg.  Antimicrobials:  None    Subjective: No issues today. Eager to go home. Urinating well.  Objective: Vitals:   02/21/20 1406 02/21/20 2051 02/22/20 0622 02/22/20 1033  BP: (!) 142/44 (!) 153/56 (!) 187/45 (!) 155/56  Pulse: (!) 59 (!) 58 (!) 55 (!) 57  Resp: 15 16 16    Temp: 98.4 F (36.9 C) 100.3 F (37.9 C) 98.6 F (37 C) 98.9 F (37.2 C)  TempSrc: Oral Oral Oral Oral  SpO2: 96% 95% 98% 90%  Weight:   101.6 kg   Height:        Intake/Output Summary (Last 24 hours) at 02/22/2020 1346 Last data filed at 02/22/2020 1300 Gross per 24 hour  Intake 600 ml  Output 600 ml  Net 0 ml   Filed Weights   02/19/20 1100 02/20/20 0551 02/22/20 0622  Weight: 103.6 kg 102.6 kg 101.6 kg    Examination:  General exam: Appears calm and comfortable Respiratory system: Rales bilaterally with rales up higher on right side. Respiratory effort normal. Cardiovascular system: S1 & S2 heard, RRR. No murmurs, rubs, gallops or clicks. Gastrointestinal system: Abdomen is slightly distended, soft and nontender. No organomegaly or masses felt. Normal bowel sounds heard. Central nervous system: Alert and oriented. No focal neurological deficits. Musculoskeletal: 1+ edema in thights. No calf tenderness Skin: No cyanosis. No rashes Psychiatry: Judgement and insight appear normal. Mood & affect appropriate.     Data Reviewed: I have personally reviewed following labs and imaging studies  CBC Lab Results  Component Value Date   WBC 6.0 02/22/2020   RBC 3.24 (L) 02/22/2020   HGB 8.2 (L) 02/22/2020   HCT 28.0 (L) 02/22/2020   MCV 86.4 02/22/2020   MCH 25.3 (L) 02/22/2020   PLT 146 (L) 02/22/2020   MCHC 29.3 (L) 02/22/2020   RDW 20.0 (H) 02/22/2020   LYMPHSABS 2.6 05/02/2018   MONOABS 0.5 05/02/2018   EOSABS 0.1 05/02/2018   BASOSABS 0.0 22/63/3354     Last metabolic panel Lab Results  Component  Value Date   NA 137 02/22/2020   K 3.8 02/22/2020   CL 98 02/22/2020   CO2 26 02/22/2020   BUN 51 (H) 02/22/2020   CREATININE 3.47 (H) 02/22/2020   GLUCOSE 98 02/22/2020   GFRNONAA 11 (L) 02/22/2020   GFRAA 13 (L) 02/22/2020   CALCIUM 8.2 (L) 02/22/2020   PHOS 3.5 02/19/2020   PROT 5.7 (L) 02/19/2020   ALBUMIN 3.0 (L) 02/19/2020   LABGLOB 2.8 01/30/2018   AGRATIO 1.4 01/30/2018   BILITOT 1.0 02/19/2020   ALKPHOS 53 02/19/2020   AST 15 02/19/2020   ALT 11 02/19/2020   ANIONGAP 13 02/22/2020    CBG (last 3)  No results for input(s): GLUCAP in the last 72 hours.   GFR: Estimated Creatinine Clearance: 13.2 mL/min (A) (by C-G formula based on SCr of 3.47 mg/dL (H)).  Coagulation Profile: No results for input(s): INR, PROTIME in the last 168 hours.  No results found for this or any previous visit (from the past 240 hour(s)).      Radiology Studies: DG CHEST PORT 1 VIEW  Result Date: 02/21/2020 CLINICAL DATA:  Acute respiratory failure with hypoxia EXAM: PORTABLE CHEST 1 VIEW COMPARISON:  February 16, 2020 FINDINGS: There is unchanged cardiomegaly. Aortic calcifications. There is prominence of the central pulmonary vasculature and mildly increased interstitial markings. Small bilateral pleural effusions are seen. IMPRESSION: Cardiomegaly and interstitial edema Small bilateral pleural effusions. Electronically Signed   By: Ebony Cargo.D.  On: 02/21/2020 15:14        Scheduled Meds: . colchicine  0.3 mg Oral Daily  . darbepoetin (ARANESP) injection - NON-DIALYSIS  100 mcg Subcutaneous Q Fri-1800  . ferrous sulfate  325 mg Oral Q lunch  . heparin  5,000 Units Subcutaneous Q8H  . hydrALAZINE  125 mg Oral TID  . isosorbide mononitrate  120 mg Oral Daily  . levothyroxine  75 mcg Oral Daily  . metoprolol succinate  12.5 mg Oral Daily  . rosuvastatin  10 mg Oral QPM  . sodium chloride flush  3 mL Intravenous Q12H   Continuous Infusions: . sodium chloride    . ferumoxytol        LOS: 6 days     Cordelia Poche, MD [facility] 02/22/2020, 1:46 PM  If 7PM-7AM, please contact night-coverage [email or web address]

## 2020-02-22 NOTE — Progress Notes (Signed)
Nutrition Education Note  RD consulted for renal diet education for CKD4. Reviewed food groups and provided written recommendations specifically determined for patient's current nutritional status.  Provided "Nutrition Therapy for CKD 3-5" handout from [employer].  Explained why diet restrictions are needed and provided lists of foods to limit/avoid that are high potassium, sodium, and phosphorus. Emphasized that the diet depends on her blood lab levels. Currently K, Phos and corrected Ca levels are WNL.  Provided specific recommendations on safer alternatives of these foods.  Discussed importance of protein intake at each meal and snack.   Pt and pt's family are interested in having outpatient diet education.   Expect good compliance with family support.   Body mass index is 37.28 kg/m. Pt meets criteria for obesity based on current BMI.  Current diet order is renal, patient is consuming approximately 50% of meals at this time. Labs and medications reviewed. No further nutrition interventions warranted at this time. If additional nutrition issues arise, please re-consult RD.  Clayton Bibles, MS, RD, LDN Inpatient Clinical Dietitian Contact information available via Amion

## 2020-02-22 NOTE — Consult Note (Signed)
[facility] Renal Consultation Note  Requesting MD: Nettey Indication for Consultation: A on CRF  HPI:  [name] is a 86 y.o. female known to me with HTN, inflammatory arthritis, elevated pulmonary artery pressure, anemia.  She also has a long standing history of CKD-  Has had many episodes of A on CKD in the past - most recent baseline crt in the mid 2's.  I saw her last in April - she was doing OK.  She presented to the hospital at [facility] for SOB on 6/19.   She was felt to be volume overloaded-  Was diuresed-  Weight down almost 5 kg with improvement in breathing.  Crt was at baseline until 6/23 when it rose to 2.97- then has worsened since with crt of 3.47 today so I am called.  No overt low BPs, if anything BP has been high.  Pt is not uremic and is no longer volume overloaded- diuretics have been stopped -  She is anxious to go home  Creat  Date/Time Value Ref Range Status  01/15/2016 10:39 AM 2.90 (H) 0.60 - 0.88 mg/dL Final   Creatinine, Ser  Date/Time Value Ref Range Status  02/22/2020 05:38 AM 3.47 (H) 0.44 - 1.00 mg/dL Final  02/21/2020 05:58 AM 3.15 (H) 0.44 - 1.00 mg/dL Final  02/20/2020 12:19 PM 2.97 (H) 0.44 - 1.00 mg/dL Final  02/19/2020 04:24 AM 2.55 (H) 0.44 - 1.00 mg/dL Final  02/18/2020 04:21 AM 2.65 (H) 0.44 - 1.00 mg/dL Final  02/17/2020 03:58 AM 2.54 (H) 0.44 - 1.00 mg/dL Final  02/16/2020 02:50 PM 2.85 (H) 0.44 - 1.00 mg/dL Final  05/02/2018 09:43 PM 2.63 (H) 0.44 - 1.00 mg/dL Final  01/30/2018 01:00 PM 2.14 (H) 0.57 - 1.00 mg/dL Final  09/21/2017 03:00 PM 2.15 (H) 0.57 - 1.00 mg/dL Final  09/15/2017 02:11 PM 1.96 (H) 0.57 - 1.00 mg/dL Final  01/17/2017 01:37 AM 3.20 (H) 0.44 - 1.00 mg/dL Final  02/17/2016 03:58 PM 2.75 (H) 0.44 - 1.00 mg/dL Final  01/16/2016 11:00 AM 2.97 (H) 0.44 - 1.00 mg/dL Final  01/07/2016 02:53 AM 2.14 (H) 0.44 - 1.00 mg/dL Final  01/06/2016 04:06 AM 1.88 (H) 0.44 - 1.00 mg/dL Final  01/05/2016 02:40 AM 2.19 (H) 0.44 -  1.00 mg/dL Final  01/04/2016 02:45 AM 2.48 (H) 0.44 - 1.00 mg/dL Final  01/03/2016 04:40 AM 2.71 (H) 0.44 - 1.00 mg/dL Final  01/02/2016 01:36 AM 2.90 (H) 0.44 - 1.00 mg/dL Final    Comment:    DELTA CHECK NOTED  01/01/2016 03:50 PM 1.17 (H) 0.44 - 1.00 mg/dL Final  07/29/2015 07:43 AM 2.04 (H) 0.44 - 1.00 mg/dL Final  07/23/2015 02:34 AM 2.05 (H) 0.44 - 1.00 mg/dL Final  12/06/2014 07:35 PM 2.72 (H) 0.50 - 1.10 mg/dL Final  11/24/2014 04:20 AM 1.76 (H) 0.50 - 1.10 mg/dL Final  11/23/2014 02:37 AM 2.09 (H) 0.50 - 1.10 mg/dL Final  11/22/2014 07:31 PM 2.30 (H) 0.50 - 1.10 mg/dL Final  11/22/2014 11:40 AM 2.51 (H) 0.50 - 1.10 mg/dL Final  11/22/2014 05:50 AM 2.50 (H) 0.50 - 1.10 mg/dL Final  11/22/2014 12:20 AM 2.39 (H) 0.50 - 1.10 mg/dL Final  11/21/2014 05:11 PM 2.76 (H) 0.50 - 1.10 mg/dL Final  03/20/2014 07:32 AM 2.20 (H) 0.50 - 1.10 mg/dL Final  03/15/2014 01:39 PM 1.72 (H) 0.50 - 1.10 mg/dL Final  04/07/2012 10:44 AM 1.34 (H) 0.50 - 1.10 mg/dL Final  12/24/2008 04:37 AM 1.09 0.40 - 1.20 mg/dL Final  12/23/2008  05:40 AM 1.03 0.40 - 1.20 mg/dL Final  12/22/2008 04:00 AM 1.37 (H) 0.40 - 1.20 mg/dL Final  12/21/2008 06:20 AM 1.18 0.40 - 1.20 mg/dL Final  12/20/2008 02:23 AM 1.20 0.40 - 1.20 mg/dL Final  12/18/2008 11:53 AM 1.08 0.40 - 1.20 mg/dL Final  03/02/2008 07:50 AM 1.33 (H)  Final  01/16/2008 09:40 PM 1.10  Final  01/16/2008 05:48 AM 1.07  Final  01/10/2008 01:25 PM 1.39 (H)  Final  11/15/2007 07:05 PM 0.9  Final     PMHx:   Past Medical History:  Diagnosis Date  . Arthritis   . Chronic diastolic CHF (congestive heart failure) ([place])    a. Echo 01/02/16: Moderate concentric LVH, vigorous LV function, EF 65-70%, normal wall motion, grade 3 diastolic dysfunction, calcified aortic valve, MAC, mild MR, mild LAE  . CKD (chronic kidney disease)    Sees Dr. Moshe Cipro who took her off benicar in 2013.             Marland Kitchen Heart murmur    long time ago--no problems  .  Hypercholesteremia   . Hypertensive heart and kidney disease with heart failure and with chronic kidney disease stage IV [facility])     Past Surgical History:  Procedure Laterality Date  . CATARACT EXTRACTION W/PHACO Right 03/20/2014   Procedure: CATARACT EXTRACTION PHACO AND INTRAOCULAR LENS PLACEMENT (IOC);  Surgeon: [name], MD;  Location: [facility];  Service: Ophthalmology;  Laterality: Right;  . failed knee surgery     on the right x 2  . JOINT REPLACEMENT     both knees  . left ankle orif      Family Hx:  Family History  Problem Relation Age of Onset  . Hypertension Mother   . Stroke Father   . Diabetes Mellitus II Daughter   . Hypertension Daughter   . Heart attack Neg Hx     Social History:  reports that she has never smoked. She has never used smokeless tobacco. She reports current alcohol use of about 1.0 standard drink of alcohol per week. She reports that she does not use drugs.  Allergies:  Allergies  Allergen Reactions  . Naprosyn [Naproxen] Other (See Comments)    Hot burning feeling    Medications: Prior to Admission medications   Medication Sig Start Date End Date Taking? Authorizing Provider  allopurinol (ZYLOPRIM) 100 MG tablet Take 100 mg by mouth daily.  12/24/15  Yes [provider]  aspirin EC 81 MG tablet Take 81 mg by mouth daily.   Yes [provider]  ferrous sulfate 325 (65 FE) MG tablet Take 325 mg by mouth daily in the afternoon.    Yes [provider]  hydrALAZINE (APRESOLINE) 100 MG tablet Take 100 mg by mouth 3 (three) times daily.    Yes [provider]  HYDROcodone-acetaminophen (NORCO) 10-325 MG tablet Take 1 tablet by mouth every 4 (four) hours as needed for moderate pain.    Yes [provider]  hydrOXYzine (VISTARIL) 50 MG capsule Take 50 mg by mouth at bedtime.  07/19/19  Yes [provider]  isosorbide mononitrate (IMDUR) 120 MG 24 hr tablet Take 1 tablet (120 mg total) by mouth daily.  11/07/19 11/06/20 Yes Nahser, Wonda Cheng, MD  lactulose [facility]) 10 GM/15ML solution Take 10 g by mouth daily as needed for mild constipation.   Yes [provider]  levothyroxine (SYNTHROID, LEVOTHROID) 75 MCG tablet Take 75 mcg by mouth daily. 06/08/18  Yes [provider]  Multiple Vitamin (MULTIVITAMIN WITH MINERALS) TABS tablet Take 1 tablet by mouth daily.   Yes [provider]  rosuvastatin (CRESTOR) 10 MG tablet Take 10 mg by mouth every evening.   Yes [provider]  torsemide (DEMADEX) 20 MG tablet Take 2 tablets (40 mg total) by mouth 2 (two) times daily. Patient taking differently: Take 80 mg by mouth 2 (two) times daily.  11/13/19  Yes Weaver, Scott T, PA-C  traZODone (DESYREL) 100 MG tablet Take 100 mg by mouth at bedtime as needed for sleep.   Yes [provider]    I have reviewed the patient's current medications.  Labs:  Results for orders placed or performed during the hospital encounter of 02/16/20 (from the past 48 hour(s))  Basic metabolic panel     Status: Abnormal   Collection Time: 02/21/20  5:58 AM  Result Value Ref Range   Sodium 138 135 - 145 mmol/L   Potassium 3.6 3.5 - 5.1 mmol/L   Chloride 98 98 - 111 mmol/L   CO2 33 (H) 22 - 32 mmol/L   Glucose, Bld 101 (H) 70 - 99 mg/dL    Comment: Glucose reference range applies only to samples taken after fasting for at least 8 hours.   BUN 46 (H) 8 - 23 mg/dL   Creatinine, Ser 3.15 (H) 0.44 - 1.00 mg/dL   Calcium 8.6 (L) 8.9 - 10.3 mg/dL   GFR calc non Af Amer 13 (L) >60 mL/min   GFR calc Af Amer 15 (L) >60 mL/min   Anion gap 7 5 - 15    Comment: Performed at [facility], [place] [address], [place], [place] [address]  Basic metabolic panel     Status: Abnormal   Collection Time: 02/22/20  5:38 AM  Result Value Ref Range   Sodium 137 135 - 145 mmol/L   Potassium 3.8 3.5 - 5.1 mmol/L   Chloride 98 98 - 111 mmol/L   CO2 26 22 - 32 mmol/L   Glucose, Bld 98  70 - 99 mg/dL    Comment: Glucose reference range applies only to samples taken after fasting for at least 8 hours.   BUN 51 (H) 8 - 23 mg/dL   Creatinine, Ser 3.47 (H) 0.44 - 1.00 mg/dL   Calcium 8.2 (L) 8.9 - 10.3 mg/dL   GFR calc non Af Amer 11 (L) >60 mL/min   GFR calc Af Amer 13 (L) >60 mL/min   Anion gap 13 5 - 15    Comment: Performed at [facility], [place] [address] [place], [place] [address]  CBC     Status: Abnormal   Collection Time: 02/22/20  6:11 AM  Result Value Ref Range   WBC 6.0 4.0 - 10.5 K/uL   RBC 3.24 (L) 3.87 - 5.11 MIL/uL   Hemoglobin 8.2 (L) 12.0 - 15.0 g/dL   HCT 28.0 (L) 36 - 46 %   MCV 86.4 80.0 - 100.0 fL   MCH 25.3 (L) 26.0 - 34.0 pg   MCHC 29.3 (L) 30.0 - 36.0 g/dL   RDW 20.0 (H) 11.5 - 15.5 %   Platelets 146 (L) 150 - 400 K/uL   nRBC 0.0 0.0 - 0.2 %    Comment: Performed at [facility], [place] [address], [place], [place] [address]     ROS:  A comprehensive review of systems was negative except for: Respiratory: positive for dyspnea on exertion  Physical Exam: Vitals:   02/22/20 2841  02/22/20 1033  BP: (!) 187/45 (!) 155/56  Pulse: (!) 55 (!) 57  Resp: 16   Temp: 98.6 F (37 C) 98.9 F (37.2 C)  SpO2: 98% 90%     General: looks younger than stated age-  Some SOB with exertion but feels better with breathing from admit HEENT: PERRLA, EOMI Neck: no JVD Heart: RRR- brady Lungs: mostly clear Abdomen: soft, non tender Extremities: no peripheral edema Skin: warm and dry Neuro: alert, non focal-  Recognizes me  Assessment/Plan: 86 year old BF with HTN, pulm HTN, inflammatory arthritis and anemia as well as moderate CKD at baseline.  Admitted with volume overload- has experienced worsening in renal function in the setting of hospitalization and diuresis  1.Renal- A on CRF in the setting of hospitalization and diuresis with right sided heart failure.  I agree with the assessment that possibly was  overdiuresed some-  Agree with stopping all diuretics.  In patients with right sided heart failure they can be sensitive to diuresis. Will check U/A to get more information- make sure nothing else going on.  Hopefully with more time off of diuretics-  Renal function will stabilize. Nothing else to add at this time.  No indications for HD and not sure I would want that for her in the long term ( or if she would want )  2. Hypertension/volume  - right sided heart failure and chronic hypertension-  Could have element of RAS-  Not overloaded right now-  Agree with holding diuretics and be careful to not tightly control BP in this setting- SBP of 150 to 170 is OK 3. Anemia  - given one dose of IV iron - will repeat.  She may need ESA as OP-  Will dose here and discuss with she and her family    Louis Meckel 02/22/2020, 12:46 PM

## 2020-02-23 LAB — BASIC METABOLIC PANEL
Anion gap: 13 (ref 5–15)
BUN: 54 mg/dL — ABNORMAL HIGH (ref 8–23)
CO2: 26 mmol/L (ref 22–32)
Calcium: 8.6 mg/dL — ABNORMAL LOW (ref 8.9–10.3)
Chloride: 100 mmol/L (ref 98–111)
Creatinine, Ser: 3.34 mg/dL — ABNORMAL HIGH (ref 0.44–1.00)
GFR calc Af Amer: 14 mL/min — ABNORMAL LOW (ref >60)
GFR calc non Af Amer: 12 mL/min — ABNORMAL LOW (ref >60)
Glucose, Bld: 108 mg/dL — ABNORMAL HIGH (ref 70–99)
Potassium: 3.5 mmol/L (ref 3.5–5.1)
Sodium: 139 mmol/L (ref 135–145)

## 2020-02-23 MED ORDER — HYDRALAZINE HCL 50 MG PO TABS: 50.0000 mg | ORAL_TABLET | Freq: Three times a day (TID) | ORAL | Status: DC | PRN

## 2020-02-23 NOTE — Progress Notes (Signed)
Subjective:  Looks good- no new c/o's- weights are wildly inaccurate - BP is high- only 450 of UOP recorded-  crt stable to down a little  Objective Vital signs in last 24 hours: Vitals:   02/23/20 0237 02/23/20 0544 02/23/20 0637 02/23/20 1018  BP: (!) 184/45 (!) 217/68  (!) 157/46  Pulse: (!) 57 (!) 59  (!) 57  Resp:  20  18  Temp:  98.6 F (37 C)  98.1 F (36.7 C)  TempSrc:  Oral  Oral  SpO2:  99%  97%  Weight:   86 kg   Height:       Weight change: -15.6 kg  Intake/Output Summary (Last 24 hours) at 02/23/2020 1345 Last data filed at 02/23/2020 0748 Gross per 24 hour  Intake 600 ml  Output 850 ml  Net -250 ml    Assessment/Plan: 86 year old BF with HTN, pulm HTN, inflammatory arthritis and anemia as well as moderate CKD at baseline.  Admitted with volume overload- has experienced worsening in renal function in the setting of hospitalization and diuresis  1.Renal- A on CRF in the setting of hospitalization and diuresis with right sided heart failure.  I agree with the assessment that possibly was overdiuresed some-  Agree with stopping all diuretics.  In patients with right sided heart failure especially they can be sensitive to diuresis.  U/A is clear.  Hopefully with more time off of diuretics-  renal function will stabilize- seems to be trying. Nothing else to add at this time.  No indications for HD and not sure I would want that for her in the long term ( or if she would want ).  If crt trends down again tomorrow I would feel comfortable watching as OP  2. Hypertension/volume  - right sided heart failure and chronic hypertension-  Could have element of RAS-  Not overloaded right now-  Agree with holding diuretics and be careful to not tightly control BP in this setting- SBP of 150 to 170 is OK.  BP a little too high at present- will inc her hydralazine. 3. Anemia  - given one dose of IV iron - will repeat.  She may need ESA as OP-  Will dose here and discuss with she and her family       Louis Meckel    Labs: Basic Metabolic Panel: Recent Labs  Lab 02/19/20 0424 02/20/20 1219 02/21/20 0558 02/22/20 0538 02/23/20 0600  NA 139   < > 138 137 139  K 3.6   < > 3.6 3.8 3.5  CL 101   < > 98 98 100  CO2 27   < > 33* 26 26  GLUCOSE 106*   < > 101* 98 108*  BUN 48*   < > 46* 51* 54*  CREATININE 2.55*   < > 3.15* 3.47* 3.34*  CALCIUM 8.2*   < > 8.6* 8.2* 8.6*  PHOS 3.5  --   --   --   --    < > = values in this interval not displayed.   Liver Function Tests: Recent Labs  Lab 02/16/20 1450 02/18/20 0421 02/19/20 0424  AST 18 14* 15  ALT 15 11 11   ALKPHOS 61 54 53  BILITOT 0.6 1.0 1.0  PROT 6.4* 5.7* 5.7*  ALBUMIN 3.4* 2.9* 3.0*   No results for input(s): LIPASE, AMYLASE in the last 168 hours. No results for input(s): AMMONIA in the last 168 hours. CBC: Recent Labs  Lab 02/17/20  [id number] 02/17/20 0358 02/18/20 0421 02/18/20 0421 02/19/20 0424 02/20/20 1219 02/22/20 0611  WBC 5.1   < > 4.8   < > 5.5 5.8 6.0  HGB 8.0*   < > 8.0*   < > 7.7* 8.5* 8.2*  HCT 27.1*   < > 27.0*   < > 26.5* 29.2* 28.0*  MCV 85.5  --  85.7  --  85.8 87.4 86.4  PLT 149*   < > 145*   < > 143* 152 146*   < > = values in this interval not displayed.   Cardiac Enzymes: No results for input(s): CKTOTAL, CKMB, CKMBINDEX, TROPONINI in the last 168 hours. CBG: No results for input(s): GLUCAP in the last 168 hours.  Iron Studies: No results for input(s): IRON, TIBC, TRANSFERRIN, FERRITIN in the last 72 hours. Studies/Results: DG CHEST PORT 1 VIEW  Result Date: 02/21/2020 CLINICAL DATA:  Acute respiratory failure with hypoxia EXAM: PORTABLE CHEST 1 VIEW COMPARISON:  February 16, 2020 FINDINGS: There is unchanged cardiomegaly. Aortic calcifications. There is prominence of the central pulmonary vasculature and mildly increased interstitial markings. Small bilateral pleural effusions are seen. IMPRESSION: Cardiomegaly and interstitial edema Small bilateral pleural effusions.  Electronically Signed   By: Prudencio Pair M.D.   On: 02/21/2020 15:14   Medications: Infusions: . sodium chloride Stopped (02/23/20 0226)    Scheduled Medications: . colchicine  0.3 mg Oral Daily  . darbepoetin (ARANESP) injection - NON-DIALYSIS  100 mcg Subcutaneous Q Fri-1800  . ferrous sulfate  325 mg Oral Q lunch  . heparin  5,000 Units Subcutaneous Q8H  . hydrALAZINE  125 mg Oral TID  . isosorbide mononitrate  120 mg Oral Daily  . levothyroxine  75 mcg Oral Daily  . metoprolol succinate  12.5 mg Oral Daily  . rosuvastatin  10 mg Oral QPM  . sodium chloride flush  3 mL Intravenous Q12H    have reviewed scheduled and prn medications.  Physical Exam: General: NAD Heart: brady Lungs: clear Abdomen: obese, soft, non tender Extremities: min edema    02/23/2020,1:45 PM  LOS: 7 days

## 2020-02-23 NOTE — Progress Notes (Signed)
PROGRESS NOTE    [name]  TKW:[id number] DOB: [birth year] DOA: 02/16/2020 PCP: Antonietta Jewel, MD   Brief Narrative: [name] is a 86 y.o. female with a history of hypertension, CKD stage IIIb, hypertrophic obstructive cardiomyopathy. Patient presented secondary to worsening LE edema in setting of acute heart failure. Patient was started on IV diuresis with improvement of symptoms. Currently being weaned to room air. Creatinine worsening with diuresis.   Assessment & Plan:   Active Problems:   Acute respiratory failure with hypoxia (HCC)   History of DVT (deep vein thrombosis)   Hypertension   Sinus bradycardia   Acute on chronic diastolic heart failure ([place])   Acute on chronic diastolic heart failure Patient diuresed with Lasix IV with good result. Patient with a net output of 2 L documented and weight decrease of 3.5 kg. Transitioned to torsemide PO on 6/22. Unfortunately, creatinine has started to worsen after continued lasix and a dose of Zaroxolyn and torsemide. Respiratory status is stable. Diuretics held secondary to worsening AKI. Still some rales/interstitial edema on imaging however, patient is stable on room air. -Continue Fluid restricted diet -Strict in and out -Repeat weight today  AKI on CKD stage IV Difficult to ascertain baseline. Appears patient most likely has CKD stage IV. Currently worsening, however, with diuresis. Creatinine has started to trend down slightly. -Nephrology recommendations: watch off of diuretics -BMP in AM -Watch urine output carefully  Acute respiratory failure with hypoxia Patient's hypoxia has improved with diuresis. She still reports some dyspnea on exertion but feels better from admission. Weaned to room air initially now back on. -Wean to room air  Alkalosis Mild. In setting of diuresis and possibly contraction alkalosis. Resolved.  Hypertension -Continue Imdur and hydralazine. Per nephrology, recommendation for no tight  control  History of DVT Noted. Not on anticoagulation  Gout -Discontinued allopurinol secondary to renal function. Will need to renally dose on discharge.  Hyperlipidemia -Continue Crestor  Hypothyroidism -Continue Synthroid 75 mcg daily  Anemia of chronic disease In setting of kidney disease. Received IV iron. Hemoglobin currently stable.  Right sided pleural effusion Large on Transthoracic Echocardiogram, small on x-ray imaging. Patient is on room air -Monitor for now   DVT prophylaxis: Heparin subq Code Status:   Code Status: Full Code Family Communication: Daughter at bedside Disposition Plan: Discharge home pending improvement in creatinine, nephrology recommendations. Anticipate discharge in 1-2 days   Consultants:   Nephrology  Procedures:   TRANSTHORACIC ECHOCARDIOGRAM (02/17/2020) IMPRESSIONS    1. Left ventricular ejection fraction, by estimation, is 65 to 70%. The  left ventricle has normal function. The left ventricle has no regional  wall motion abnormalities. There is severe concentric left ventricular  hypertrophy. Left ventricular diastolic  parameters are consistent with Grade II diastolic dysfunction  (pseudonormalization). Elevated left ventricular end-diastolic pressure.  The average left ventricular global longitudinal strain is -14.2 %.  2. Right ventricular systolic function is normal. The right ventricular  size is normal. There is severely elevated pulmonary artery systolic  pressure. The estimated right ventricular systolic pressure is 92.4 mmHg.  3. Left atrial size was mildly dilated.  4. Large pleural effusion in the left lateral region.  5. The mitral valve is normal in structure. Moderate mitral valve  regurgitation. No evidence of mitral stenosis.  6. The aortic valve is normal in structure. Aortic valve regurgitation is  mild. Mild to moderate aortic valve sclerosis/calcification is present,  without any evidence of aortic  stenosis.  7. The inferior vena cava  is normal in size with greater than 50%  respiratory variability, suggesting right atrial pressure of 3 mmHg.  Antimicrobials:  None    Subjective: Some mild dyspnea. No other issues.  Objective: Vitals:   02/23/20 0237 02/23/20 0544 02/23/20 0637 02/23/20 1018  BP: (!) 184/45 (!) 217/68  (!) 157/46  Pulse: (!) 57 (!) 59  (!) 57  Resp:  20  18  Temp:  98.6 F (37 C)  98.1 F (36.7 C)  TempSrc:  Oral  Oral  SpO2:  99%  97%  Weight:   86 kg   Height:        Intake/Output Summary (Last 24 hours) at 02/23/2020 1427 Last data filed at 02/23/2020 0748 Gross per 24 hour  Intake 600 ml  Output 850 ml  Net -250 ml   Filed Weights   02/20/20 0551 02/22/20 0622 02/23/20 0637  Weight: 102.6 kg 101.6 kg 86 kg    Examination:  General exam: Appears calm and comfortable Respiratory system: Diminished. Respiratory effort normal. Cardiovascular system: S1 & S2 heard, RRR. No murmurs, rubs, gallops or clicks. Gastrointestinal system: Abdomen is nondistended, soft and nontender. No organomegaly or masses felt. Normal bowel sounds heard. Central nervous system: Alert and oriented. No focal neurological deficits. Musculoskeletal: No calf tenderness Skin: No cyanosis. No rashes Psychiatry: Judgement and insight appear normal. Mood & affect appropriate.     Data Reviewed: I have personally reviewed following labs and imaging studies  CBC Lab Results  Component Value Date   WBC 6.0 02/22/2020   RBC 3.24 (L) 02/22/2020   HGB 8.2 (L) 02/22/2020   HCT 28.0 (L) 02/22/2020   MCV 86.4 02/22/2020   MCH 25.3 (L) 02/22/2020   PLT 146 (L) 02/22/2020   MCHC 29.3 (L) 02/22/2020   RDW 20.0 (H) 02/22/2020   LYMPHSABS 2.6 05/02/2018   MONOABS 0.5 05/02/2018   EOSABS 0.1 05/02/2018   BASOSABS 0.0 49/67/5916     Last metabolic panel Lab Results  Component Value Date   NA 139 02/23/2020   K 3.5 02/23/2020   CL 100 02/23/2020   CO2 26 02/23/2020    BUN 54 (H) 02/23/2020   CREATININE 3.34 (H) 02/23/2020   GLUCOSE 108 (H) 02/23/2020   GFRNONAA 12 (L) 02/23/2020   GFRAA 14 (L) 02/23/2020   CALCIUM 8.6 (L) 02/23/2020   PHOS 3.5 02/19/2020   PROT 5.7 (L) 02/19/2020   ALBUMIN 3.0 (L) 02/19/2020   LABGLOB 2.8 01/30/2018   AGRATIO 1.4 01/30/2018   BILITOT 1.0 02/19/2020   ALKPHOS 53 02/19/2020   AST 15 02/19/2020   ALT 11 02/19/2020   ANIONGAP 13 02/23/2020    CBG (last 3)  No results for input(s): GLUCAP in the last 72 hours.   GFR: Estimated Creatinine Clearance: 12.6 mL/min (A) (by C-G formula based on SCr of 3.34 mg/dL (H)).  Coagulation Profile: No results for input(s): INR, PROTIME in the last 168 hours.  No results found for this or any previous visit (from the past 240 hour(s)).      Radiology Studies: DG CHEST PORT 1 VIEW  Result Date: 02/21/2020 CLINICAL DATA:  Acute respiratory failure with hypoxia EXAM: PORTABLE CHEST 1 VIEW COMPARISON:  February 16, 2020 FINDINGS: There is unchanged cardiomegaly. Aortic calcifications. There is prominence of the central pulmonary vasculature and mildly increased interstitial markings. Small bilateral pleural effusions are seen. IMPRESSION: Cardiomegaly and interstitial edema Small bilateral pleural effusions. Electronically Signed   By: Prudencio Pair M.D.   On: 02/21/2020 15:14  Scheduled Meds: . colchicine  0.3 mg Oral Daily  . darbepoetin (ARANESP) injection - NON-DIALYSIS  100 mcg Subcutaneous Q Fri-1800  . ferrous sulfate  325 mg Oral Q lunch  . heparin  5,000 Units Subcutaneous Q8H  . hydrALAZINE  125 mg Oral TID  . isosorbide mononitrate  120 mg Oral Daily  . levothyroxine  75 mcg Oral Daily  . metoprolol succinate  12.5 mg Oral Daily  . rosuvastatin  10 mg Oral QPM  . sodium chloride flush  3 mL Intravenous Q12H   Continuous Infusions: . sodium chloride Stopped (02/23/20 0226)     LOS: 7 days     Cordelia Poche, MD [facility] 02/23/2020, 2:27  PM  If 7PM-7AM, please contact night-coverage [email or web address]

## 2020-02-24 DIAGNOSIS — N179 Acute kidney failure, unspecified: Secondary | ICD-10-CM

## 2020-02-24 LAB — BASIC METABOLIC PANEL
Anion gap: 14 (ref 5–15)
BUN: 57 mg/dL — ABNORMAL HIGH (ref 8–23)
CO2: 26 mmol/L (ref 22–32)
Calcium: 8.4 mg/dL — ABNORMAL LOW (ref 8.9–10.3)
Chloride: 100 mmol/L (ref 98–111)
Creatinine, Ser: 3.48 mg/dL — ABNORMAL HIGH (ref 0.44–1.00)
GFR calc Af Amer: 13 mL/min — ABNORMAL LOW (ref >60)
GFR calc non Af Amer: 11 mL/min — ABNORMAL LOW (ref >60)
Glucose, Bld: 110 mg/dL — ABNORMAL HIGH (ref 70–99)
Potassium: 3.6 mmol/L (ref 3.5–5.1)
Sodium: 140 mmol/L (ref 135–145)

## 2020-02-24 MED ORDER — ALLOPURINOL 100 MG PO TABS: 50.0000 mg | ORAL_TABLET | ORAL | 3 refills | Status: DC

## 2020-02-24 MED ORDER — HYDRALAZINE HCL 50 MG PO TABS: 125.0000 mg | ORAL_TABLET | Freq: Three times a day (TID) | ORAL | 0 refills | Status: DC

## 2020-02-24 MED ORDER — METOPROLOL SUCCINATE ER 25 MG PO TB24: 12.5000 mg | ORAL_TABLET | Freq: Every day | ORAL | 0 refills | Status: DC

## 2020-02-24 MED ORDER — HYDRALAZINE HCL 25 MG PO TABS: 125.0000 mg | ORAL_TABLET | Freq: Three times a day (TID) | ORAL | 0 refills | Status: DC

## 2020-02-24 NOTE — Progress Notes (Signed)
Patient discharged to home with family, Discharge instructions reviewed with patient and daughter who verbalized understanding. New  RX's sent electronically to pharmacy.

## 2020-02-24 NOTE — Progress Notes (Signed)
Subjective:  Looks good- no new c/o's-  BP better with higher dose of hydralazine- only 400 of UOP recorded-  crt stable   Objective Vital signs in last 24 hours: Vitals:   02/23/20 2136 02/24/20 0551 02/24/20 0700 02/24/20 0829  BP: (!) 174/58 (!) 160/51    Pulse: (!) 56 (!) 56    Resp: 17 17    Temp: 99.2 F (37.3 C) 98.7 F (37.1 C)    TempSrc:      SpO2: 92% 92%    Weight:   89.5 kg 98.8 kg  Height:       Weight change: 3.468 kg  Intake/Output Summary (Last 24 hours) at 02/24/2020 1010 Last data filed at 02/24/2020 0945 Gross per 24 hour  Intake 780 ml  Output 50 ml  Net 730 ml    Assessment/Plan: 86 year old BF with HTN, pulm HTN, inflammatory arthritis and anemia as well as moderate CKD at baseline.  Admitted with volume overload- has experienced worsening in renal function in the setting of hospitalization and diuresis  1.Renal- A on CRF in the setting of hospitalization and diuresis with right sided heart failure.  I agree with the assessment that possibly was overdiuresed some-  Agree with stopping all diuretics.  In patients with right sided heart failure especially they can be sensitive to diuresis.  U/A is clear.  Hopefully with more time off of diuretics-  renal function will stabilize- seems to be trying. Nothing else to add at this time.  No indications for HD and not sure I would want that for her in the long term ( or if she would want ).  Renal function at least stable as is volume status.  In the interest of preserving QOL in 86 year old-  I would be comfortable sending her out with close OP follow up-  I can check labs in a few days 2. Hypertension/volume  - right sided heart failure and chronic hypertension-  Could have element of RAS-  Not overloaded right now-  Agree with holding diuretics and be careful to not tightly control BP in this setting- SBP of 150 to 170 is OK.   inc her hydralazine yest.  Would send out on no diuretics at first--  Maybe resume torsemide 40  BID (half dose) on Tuesday  3. Anemia  - given 2 doses of IV iron - will repeat.  She may need ESA as OP-  have dosed here - discussed with she and her family      Louis Meckel    Labs: Basic Metabolic Panel: Recent Labs  Lab 02/19/20 0424 02/20/20 1219 02/22/20 0538 02/23/20 0600 02/24/20 0625  NA 139   < > 137 139 140  K 3.6   < > 3.8 3.5 3.6  CL 101   < > 98 100 100  CO2 27   < > 26 26 26   GLUCOSE 106*   < > 98 108* 110*  BUN 48*   < > 51* 54* 57*  CREATININE 2.55*   < > 3.47* 3.34* 3.48*  CALCIUM 8.2*   < > 8.2* 8.6* 8.4*  PHOS 3.5  --   --   --   --    < > = values in this interval not displayed.   Liver Function Tests: Recent Labs  Lab 02/18/20 0421 02/19/20 0424  AST 14* 15  ALT 11 11  ALKPHOS 54 53  BILITOT 1.0 1.0  PROT 5.7* 5.7*  ALBUMIN 2.9*  3.0*   No results for input(s): LIPASE, AMYLASE in the last 168 hours. No results for input(s): AMMONIA in the last 168 hours. CBC: Recent Labs  Lab 02/18/20 0421 02/18/20 0421 02/19/20 0424 02/20/20 1219 02/22/20 0611  WBC 4.8   < > 5.5 5.8 6.0  HGB 8.0*   < > 7.7* 8.5* 8.2*  HCT 27.0*   < > 26.5* 29.2* 28.0*  MCV 85.7  --  85.8 87.4 86.4  PLT 145*   < > 143* 152 146*   < > = values in this interval not displayed.   Cardiac Enzymes: No results for input(s): CKTOTAL, CKMB, CKMBINDEX, TROPONINI in the last 168 hours. CBG: No results for input(s): GLUCAP in the last 168 hours.  Iron Studies: No results for input(s): IRON, TIBC, TRANSFERRIN, FERRITIN in the last 72 hours. Studies/Results: No results found. Medications: Infusions: . sodium chloride Stopped (02/23/20 0226)    Scheduled Medications: . colchicine  0.3 mg Oral Daily  . darbepoetin (ARANESP) injection - NON-DIALYSIS  100 mcg Subcutaneous Q Fri-1800  . ferrous sulfate  325 mg Oral Q lunch  . heparin  5,000 Units Subcutaneous Q8H  . hydrALAZINE  125 mg Oral TID  . isosorbide mononitrate  120 mg Oral Daily  . levothyroxine  75  mcg Oral Daily  . metoprolol succinate  12.5 mg Oral Daily  . rosuvastatin  10 mg Oral QPM  . sodium chloride flush  3 mL Intravenous Q12H    have reviewed scheduled and prn medications.  Physical Exam: General: NAD Heart: brady Lungs: clear Abdomen: obese, soft, non tender Extremities: min edema    02/24/2020,10:10 AM  LOS: 8 days

## 2020-02-24 NOTE — Discharge Summary (Signed)
Physician Discharge Summary  Cadance Raus JJH:[id number] DOB: 23-Oct-1928 DOA: 02/16/2020  PCP: Antonietta Jewel, MD  Admit date: 02/16/2020 Discharge date: 02/24/2020  Admitted From: Home Disposition: Home  Recommendations for Outpatient Follow-up:  1. Follow up with PCP in 1 week 2. Follow up with nephrologist this week 3. Please obtain BMP within 1-3 days 4. Please follow up on the following pending results: None  Home Health: PT, OT, Home aide Equipment/Devices: None  Discharge Condition: Guarded CODE STATUS: Full code Diet recommendation: Strict renal diet   Brief/Interim Summary:  Admission HPI written by Sueanne Margarita, DO   Chief Complaint: shortness of breath, LE edema  HPI: [name] is a 86 y.o. female with a pertinent history of heart failure, CKD 4, murmur, iron deficiency anemia who presents with shortness of breath to [facility] long emergency department.   Over the last 2-3 week patient has become progressively more short of breath with lower extremity edema, associated symptoms include dyspnea on exertion and orthopnea.  She endorses compliance with all her medicines.  [name], daughter, in the room corroborates the story and states that the pt's sister who has not been doin gwell from cancer and recently transferred to hospice.  Family has been bringing meals, presumably high in sodium.  Denies any chest pain currently.  Emergency department physician saw patient desat to 87% on room air and require 1 to 2 L oxygen to improve to the low 90s.  Otherwise hemodynamically stable.  WBC 6.2, chronic anemia with a hemoglobin of 8.7, 2.85 serum creatinine, 1800 BNP up from prior. EKG shows sinus bradycardia with PAC, left ventricular hypertrophy and T wave inversions in V2 which is different from prior but I question lead placement.  Repeat Chest x-ray showed findings suggestive of heart failure with mild interstitial edema.  Will admit to medicine with  telemetry   Hospital course:  Acute on chronic diastolic heart failure Patient diuresed with Lasix IV with good result. Patient with a net output of 2 L documented and weight decrease of 3.5 kg. Transitioned to torsemide PO on 6/22. Unfortunately, creatinine has started to worsen after continued lasix and a dose of Zaroxolyn and torsemide. Respiratory status is stable. Diuretics held secondary to worsening AKI. Still some rales/interstitial edema on imaging however, patient is stable on room air. Torsemide held on discharge secondary to nephrology recommendations in light of AKI. Weight on discharge of 217.81 lbs.  AKI on CKD stage IV Difficult to ascertain baseline. Appears patient most likely has CKD stage IV. Currently worsening, however, with diuresis. Creatinine started to trend down slightly but stabilized. Nephrology with recommendations for outpatient CLOSE follow-up; this was discussed with the patient and her daughter. Strict renal diet.  Acute respiratory failure with hypoxia Patient's hypoxia has improved with diuresis. She still reports some dyspnea on exertion but feels better from admission. Weaned to room air.  Alkalosis Mild. In setting of diuresis and possibly contraction alkalosis. Resolved.  Hypertension Continue Imdur. Increased to Hydralazine 125 mg TID. Metoprolol started as well.  History of DVT Noted. Not on anticoagulation  Gout Discontinued allopurinol secondary to renal function. Adjusted dose to 50 mg twice per week  Hyperlipidemia Continue Crestor  Hypothyroidism Continue Synthroid 75 mcg daily  Anemia of chronic disease In setting of kidney disease. Received IV iron. Hemoglobin currently stable.  Right sided pleural effusion Large on Transthoracic Echocardiogram, small on x-ray imaging. Patient is on room air.   Discharge Diagnoses:  Active Problems:   Acute respiratory failure  with hypoxia ([place])   History of DVT (deep vein  thrombosis)   Hypertension   Sinus bradycardia   Acute on chronic diastolic heart failure [facility])    Discharge Instructions  Discharge Instructions    Increase activity slowly   Complete by: As directed      Allergies as of 02/24/2020      Reactions   Naprosyn [naproxen] Other (See Comments)   Hot burning feeling      Medication List    STOP taking these medications   torsemide 20 MG tablet Commonly known as: DEMADEX   Vistaril 50 MG capsule Generic drug: hydrOXYzine     TAKE these medications   allopurinol 100 MG tablet Commonly known as: ZYLOPRIM Take 0.5 tablets (50 mg total) by mouth 2 (two) times a week. Start taking on: February 25, 2020 What changed:   how much to take  when to take this   aspirin EC 81 MG tablet Take 81 mg by mouth daily.   ferrous sulfate 325 (65 FE) MG tablet Take 325 mg by mouth daily in the afternoon.   hydrALAZINE 50 MG tablet Commonly known as: APRESOLINE Take 2.5 tablets (125 mg total) by mouth 3 (three) times daily. What changed:   medication strength  how much to take   HYDROcodone-acetaminophen 10-325 MG tablet Commonly known as: NORCO Take 1 tablet by mouth every 4 (four) hours as needed for moderate pain.   isosorbide mononitrate 120 MG 24 hr tablet Commonly known as: IMDUR Take 1 tablet (120 mg total) by mouth daily.   lactulose 10 GM/15ML solution Commonly known as: CHRONULAC Take 10 g by mouth daily as needed for mild constipation.   levothyroxine 75 MCG tablet Commonly known as: SYNTHROID Take 75 mcg by mouth daily.   metoprolol succinate 25 MG 24 hr tablet Commonly known as: TOPROL-XL Take 0.5 tablets (12.5 mg total) by mouth daily. Start taking on: February 25, 2020   multivitamin with minerals Tabs tablet Take 1 tablet by mouth daily.   rosuvastatin 10 MG tablet Commonly known as: CRESTOR Take 10 mg by mouth every evening.   traZODone 100 MG tablet Commonly known as: DESYREL Take 100 mg by mouth at  bedtime as needed for sleep.       Follow-up Information    [facility] [place] Follow up.   Why: agency will provide home health services Contact information: [place] [address] [phone]              Allergies  Allergen Reactions  . Naprosyn [Naproxen] Other (See Comments)    Hot burning feeling    Consultations:  Nephrology   Procedures/Studies: DG CHEST PORT 1 VIEW  Result Date: 02/21/2020 CLINICAL DATA:  Acute respiratory failure with hypoxia EXAM: PORTABLE CHEST 1 VIEW COMPARISON:  February 16, 2020 FINDINGS: There is unchanged cardiomegaly. Aortic calcifications. There is prominence of the central pulmonary vasculature and mildly increased interstitial markings. Small bilateral pleural effusions are seen. IMPRESSION: Cardiomegaly and interstitial edema Small bilateral pleural effusions. Electronically Signed   By: Prudencio Pair M.D.   On: 02/21/2020 15:14   DG Chest Port 1 View  Result Date: 02/16/2020 CLINICAL DATA:  Shortness of breath, lower extremity edema EXAM: PORTABLE CHEST 1 VIEW COMPARISON:  05/02/2018 FINDINGS: Stable cardiomegaly. Atherosclerotic calcification of the aortic knob. Pulmonary vascular congestion with mild diffuse interstitial prominence. Small nodular densities within the bilateral perihilar regions are unchanged in appearance from 01/17/2017. Probable small right pleural effusion. No  pneumothorax. IMPRESSION: Findings suggesting CHF with mild interstitial edema. Electronically Signed   By: [name] D.O.   On: 02/16/2020 15:37   ECHOCARDIOGRAM COMPLETE  Result Date: 02/17/2020    ECHOCARDIOGRAM REPORT   Patient Name:   SHAYDEN GINGRICH Date of Exam: 02/17/2020 Medical Rec #:  [id number]      Height:       65.0 in Accession #:    [id number]     Weight:       233.9 lb Date of Birth:  1928/12/01      BSA:          2.114 m Patient Age:    76 years       BP:           170/66 mmHg Patient Gender: F              HR:            61 bpm. Exam Location:  Inpatient Procedure: 2D Echo and Strain Analysis Indications:    Congestive Heart Failure 428.0 / I50.9  History:        Patient has prior history of Echocardiogram examinations, most                 recent 09/21/2017. CHF, Signs/Symptoms:Murmur; Risk                 Factors:Dyslipidemia. Hypertensive heart and kidney disease with                 heart failure and with chronic kidney disease.  Sonographer:    Darlina Sicilian RDCS Referring Phys: [id number] [place]  1. Left ventricular ejection fraction, by estimation, is 65 to 70%. The left ventricle has normal function. The left ventricle has no regional wall motion abnormalities. There is severe concentric left ventricular hypertrophy. Left ventricular diastolic  parameters are consistent with Grade II diastolic dysfunction (pseudonormalization). Elevated left ventricular end-diastolic pressure. The average left ventricular global longitudinal strain is -14.2 %.  2. Right ventricular systolic function is normal. The right ventricular size is normal. There is severely elevated pulmonary artery systolic pressure. The estimated right ventricular systolic pressure is 88.5 mmHg.  3. Left atrial size was mildly dilated.  4. Large pleural effusion in the left lateral region.  5. The mitral valve is normal in structure. Moderate mitral valve regurgitation. No evidence of mitral stenosis.  6. The aortic valve is normal in structure. Aortic valve regurgitation is mild. Mild to moderate aortic valve sclerosis/calcification is present, without any evidence of aortic stenosis.  7. The inferior vena cava is normal in size with greater than 50% respiratory variability, suggesting right atrial pressure of 3 mmHg. FINDINGS  Left Ventricle: Left ventricular ejection fraction, by estimation, is 65 to 70%. The left ventricle has normal function. The left ventricle has no regional wall motion abnormalities. The average left ventricular global  longitudinal strain is -14.2 %. The left ventricular internal cavity size was normal in size. There is severe concentric left ventricular hypertrophy. Left ventricular diastolic parameters are consistent with Grade II diastolic dysfunction (pseudonormalization). Elevated left ventricular end-diastolic pressure. Right Ventricle: The right ventricular size is normal. No increase in right ventricular wall thickness. Right ventricular systolic function is normal. There is severely elevated pulmonary artery systolic pressure. The tricuspid regurgitant velocity is 3.50 m/s, and with an assumed right atrial pressure of 15 mmHg, the estimated right ventricular systolic pressure is 02.7 mmHg. Left Atrium: Left atrial size was mildly dilated. Right  Atrium: Right atrial size was normal in size. Pericardium: There is no evidence of pericardial effusion. The mitral valve is normal in structure. Normal mobility of the mitral valve leaflets. Moderate mitral valve regurgitation, with posteriorly-directed jet. No evidence of mitral valve stenosis. Tricuspid Valve: The tricuspid valve is normal in structure. Tricuspid valve regurgitation is mild . No evidence of tricuspid stenosis. Aortic Valve: The aortic valve is normal in structure.. There is moderate thickening and moderate calcification of the aortic valve. Aortic valve regurgitation is mild. Mild to moderate aortic valve sclerosis/calcification is present, without any evidence of aortic stenosis. There is moderate thickening of the aortic valve. There is moderate calcification of the aortic valve. Pulmonic Valve: The pulmonic valve was normal in structure. Pulmonic valve regurgitation is mild. No evidence of pulmonic stenosis. Aorta: The aortic root is normal in size and structure. Venous: The inferior vena cava is normal in size with greater than 50% respiratory variability, suggesting right atrial pressure of 3 mmHg. IAS/Shunts: No atrial level shunt detected by color flow  Doppler. Additional Comments: There is a large pleural effusion in the left lateral region.  LEFT VENTRICLE PLAX 2D LVIDd:         4.00 cm  Diastology LVIDs:         2.70 cm  LV e' lateral:   8.92 cm/s LV PW:         1.30 cm  LV E/e' lateral: 17.0 LV IVS:        2.00 cm  LV e' medial:    4.79 cm/s LVOT diam:     1.60 cm  LV E/e' medial:  31.7 LV SV:         58 LV SV Index:   27       2D Longitudinal Strain LVOT Area:     2.01 cm 2D Strain GLS Avg:     -14.2 %  RIGHT VENTRICLE RV S prime:     12.60 cm/s TAPSE (M-mode): 2.4 cm LEFT ATRIUM             Index       RIGHT ATRIUM           Index LA diam:        4.40 cm 2.08 cm/m  RA Area:     17.20 cm LA Vol (A2C):   52.6 ml 24.88 ml/m RA Volume:   49.90 ml  23.60 ml/m LA Vol (A4C):   67.9 ml 32.11 ml/m LA Biplane Vol: 60.7 ml 28.71 ml/m  AORTIC VALVE LVOT Vmax:   131.00 cm/s LVOT Vmean:  84.600 cm/s LVOT VTI:    0.288 m  AORTA Ao Root diam: 3.00 cm MITRAL VALVE                TRICUSPID VALVE MV Area (PHT): 4.63 cm     TR Peak grad:   49.0 mmHg MV Vmean:      26.4 cm/s    TR Vmax:        350.00 cm/s MV Decel Time: 164 msec MV E velocity: 152.00 cm/s  SHUNTS MV A velocity: 79.20 cm/s   Systemic VTI:  0.29 m MV E/A ratio:  1.92         Systemic Diam: 1.60 cm Ena Dawley MD Electronically signed by Ena Dawley MD Signature Date/Time: 02/17/2020/12:41:37 PM    Final      TRANSTHORACIC ECHOCARDIOGRAM (02/17/2020) IMPRESSIONS    1. Left ventricular ejection fraction, by estimation, is 65 to 70%. The  left ventricle has normal function. The left ventricle has no regional  wall motion abnormalities. There is severe concentric left ventricular  hypertrophy. Left ventricular diastolic  parameters are consistent with Grade II diastolic dysfunction  (pseudonormalization). Elevated left ventricular end-diastolic pressure.  The average left ventricular global longitudinal strain is -14.2 %.  2. Right ventricular systolic function is normal. The right  ventricular  size is normal. There is severely elevated pulmonary artery systolic  pressure. The estimated right ventricular systolic pressure is 69.6 mmHg.  3. Left atrial size was mildly dilated.  4. Large pleural effusion in the left lateral region.  5. The mitral valve is normal in structure. Moderate mitral valve  regurgitation. No evidence of mitral stenosis.  6. The aortic valve is normal in structure. Aortic valve regurgitation is  mild. Mild to moderate aortic valve sclerosis/calcification is present,  without any evidence of aortic stenosis.  7. The inferior vena cava is normal in size with greater than 50%  respiratory variability, suggesting right atrial pressure of 3 mmHg.   Subjective: No dyspnea.  Discharge Exam: Vitals:   02/23/20 2136 02/24/20 0551  BP: (!) 174/58 (!) 160/51  Pulse: (!) 56 (!) 56  Resp: 17 17  Temp: 99.2 F (37.3 C) 98.7 F (37.1 C)  SpO2: 92% 92%   Vitals:   02/23/20 2136 02/24/20 0551 02/24/20 0700 02/24/20 0829  BP: (!) 174/58 (!) 160/51    Pulse: (!) 56 (!) 56    Resp: 17 17    Temp: 99.2 F (37.3 C) 98.7 F (37.1 C)    TempSrc:      SpO2: 92% 92%    Weight:   89.5 kg 98.8 kg  Height:        General: Pt is alert, awake, not in acute distress Cardiovascular: RRR, S1/S2 +, no rubs, no gallops Respiratory: Diminished, no wheezing, no rhonchi Abdominal: Soft, NT, ND, bowel sounds + Extremities: LE edema, no cyanosis    The results of significant diagnostics from this hospitalization (including imaging, microbiology, ancillary and laboratory) are listed below for reference.     Microbiology: No results found for this or any previous visit (from the past 240 hour(s)).   Labs: BNP (last 3 results) Recent Labs    02/16/20 1450  BNP 2,952.8*   Basic Metabolic Panel: Recent Labs  Lab 02/18/20 0421 02/18/20 0421 02/19/20 0424 02/19/20 0424 02/20/20 1219 02/21/20 0558 02/22/20 0538 02/23/20 0600 02/24/20 0625    NA 138   < > 139   < > 138 138 137 139 140  K 3.5   < > 3.6   < > 3.8 3.6 3.8 3.5 3.6  CL 98   < > 101   < > 98 98 98 100 100  CO2 29   < > 27   < > 28 33* 26 26 26   GLUCOSE 107*   < > 106*   < > 118* 101* 98 108* 110*  BUN 52*   < > 48*   < > 46* 46* 51* 54* 57*  CREATININE 2.65*   < > 2.55*   < > 2.97* 3.15* 3.47* 3.34* 3.48*  CALCIUM 8.0*   < > 8.2*   < > 8.3* 8.6* 8.2* 8.6* 8.4*  MG 2.0  --  2.2  --   --   --   --   --   --   PHOS  --   --  3.5  --   --   --   --   --   --    < > =  values in this interval not displayed.   Liver Function Tests: Recent Labs  Lab 02/18/20 0421 02/19/20 0424  AST 14* 15  ALT 11 11  ALKPHOS 54 53  BILITOT 1.0 1.0  PROT 5.7* 5.7*  ALBUMIN 2.9* 3.0*   No results for input(s): LIPASE, AMYLASE in the last 168 hours. No results for input(s): AMMONIA in the last 168 hours. CBC: Recent Labs  Lab 02/18/20 0421 02/19/20 0424 02/20/20 1219 02/22/20 0611  WBC 4.8 5.5 5.8 6.0  HGB 8.0* 7.7* 8.5* 8.2*  HCT 27.0* 26.5* 29.2* 28.0*  MCV 85.7 85.8 87.4 86.4  PLT 145* 143* 152 146*   Cardiac Enzymes: No results for input(s): CKTOTAL, CKMB, CKMBINDEX, TROPONINI in the last 168 hours. BNP: Invalid input(s): POCBNP CBG: No results for input(s): GLUCAP in the last 168 hours. D-Dimer No results for input(s): DDIMER in the last 72 hours. Hgb A1c No results for input(s): HGBA1C in the last 72 hours. Lipid Profile No results for input(s): CHOL, HDL, LDLCALC, TRIG, CHOLHDL, LDLDIRECT in the last 72 hours. Thyroid function studies No results for input(s): TSH, T4TOTAL, T3FREE, THYROIDAB in the last 72 hours.  Invalid input(s): FREET3 Anemia work up No results for input(s): VITAMINB12, FOLATE, FERRITIN, TIBC, IRON, RETICCTPCT in the last 72 hours. Urinalysis    Component Value Date/Time   COLORURINE YELLOW 02/22/2020 1842   APPEARANCEUR CLEAR 02/22/2020 1842   LABSPEC 1.014 02/22/2020 1842   PHURINE 6.0 02/22/2020 1842   GLUCOSEU NEGATIVE  02/22/2020 1842   HGBUR NEGATIVE 02/22/2020 1842   BILIRUBINUR NEGATIVE 02/22/2020 1842   KETONESUR NEGATIVE 02/22/2020 1842   PROTEINUR >=300 (A) 02/22/2020 1842   UROBILINOGEN 0.2 11/21/2014 2230   NITRITE NEGATIVE 02/22/2020 1842   LEUKOCYTESUR NEGATIVE 02/22/2020 1842   Sepsis Labs Invalid input(s): PROCALCITONIN,  WBC,  LACTICIDVEN Microbiology No results found for this or any previous visit (from the past 240 hour(s)).   Time coordinating discharge: 35 minutes  SIGNED:   Cordelia Poche, MD [facility] 02/24/2020, 1:25 PM

## 2020-02-24 NOTE — Discharge Instructions (Addendum)
[name],  You were in the hospital with heart failure and acute kidney dysfunction. Your heart failure was treated with Lasix IV and torsemide, but unfortunately, this caused your kidneys to have some worsening function. The plan will be for you to hold your torsemide as an outpatient and to follow closely with Dr. Moshe Cipro.

## 2020-02-24 NOTE — Progress Notes (Signed)
Pharmacy Informational Note:  Aranesp ordered 6/25 by Renal MD not charted  But patient's daughter verified that medication given  Minda Ditto PharmD 02/24/2020, 11:23 AM

## 2020-02-26 NOTE — Progress Notes (Signed)
CM received call from patient's daughter who indicated she has reached out to [employer] and [facility] and neither agency is set up to service her mother for [facility].  Initial set-up with [facility] did not work out and patient was re-set up with [facility].  CM spoke with rep who states that the [facility] services ordered day of discharge could not be serviced by them due to staffing issues and it appeared in [place] that [facility] would service so [facility] did not open a file for patient.  At this time [facility] rep reports there is still no ability to service the disciplines ordered for patient.  CM reached out to other [facility] agencies and was able to secure services with [facility] for HHPT/OT/Aide.  Patient's daughter was notified of this arrangement and provided with contact information for agency.  Agency rep also states agency will reach out to daughter regarding scheduling.

## 2020-03-22 ENCOUNTER — Encounter (HOSPITAL_COMMUNITY): Payer: Self-pay | Admitting: *Deleted

## 2020-03-22 ENCOUNTER — Emergency Department (HOSPITAL_COMMUNITY): Payer: Medicare Other

## 2020-03-22 ENCOUNTER — Other Ambulatory Visit: Payer: Self-pay

## 2020-03-22 ENCOUNTER — Inpatient Hospital Stay (HOSPITAL_COMMUNITY)
Admission: EM | Admit: 2020-03-22 | Discharge: 2020-03-28 | DRG: 291 | Disposition: A | Discharge status: Hospice | Payer: Medicare Other | Attending: Internal Medicine | Admitting: Internal Medicine

## 2020-03-22 ENCOUNTER — Inpatient Hospital Stay (HOSPITAL_COMMUNITY)
Admit: 2020-03-22 | Discharge: 2020-03-22 | Disposition: A | Discharge status: Home / Self Care | Payer: Medicare Other | Attending: Internal Medicine | Admitting: Internal Medicine

## 2020-03-22 DIAGNOSIS — D631 Anemia in chronic kidney disease: Secondary | ICD-10-CM | POA: Diagnosis present

## 2020-03-22 DIAGNOSIS — E875 Hyperkalemia: Secondary | ICD-10-CM | POA: Diagnosis present

## 2020-03-22 DIAGNOSIS — F32A Depression, unspecified: Secondary | ICD-10-CM | POA: Diagnosis present

## 2020-03-22 DIAGNOSIS — E785 Hyperlipidemia, unspecified: Secondary | ICD-10-CM | POA: Diagnosis present

## 2020-03-22 DIAGNOSIS — J9601 Acute respiratory failure with hypoxia: Secondary | ICD-10-CM | POA: Diagnosis present

## 2020-03-22 DIAGNOSIS — F419 Anxiety disorder, unspecified: Secondary | ICD-10-CM | POA: Diagnosis present

## 2020-03-22 DIAGNOSIS — F329 Major depressive disorder, single episode, unspecified: Secondary | ICD-10-CM | POA: Diagnosis present

## 2020-03-22 DIAGNOSIS — R54 Age-related physical debility: Secondary | ICD-10-CM | POA: Diagnosis present

## 2020-03-22 DIAGNOSIS — I358 Other nonrheumatic aortic valve disorders: Secondary | ICD-10-CM | POA: Diagnosis present

## 2020-03-22 DIAGNOSIS — J90 Pleural effusion, not elsewhere classified: Secondary | ICD-10-CM

## 2020-03-22 DIAGNOSIS — I132 Hypertensive heart and chronic kidney disease with heart failure and with stage 5 chronic kidney disease, or end stage renal disease: Principal | ICD-10-CM | POA: Diagnosis present

## 2020-03-22 DIAGNOSIS — E872 Acidosis, unspecified: Secondary | ICD-10-CM | POA: Diagnosis present

## 2020-03-22 DIAGNOSIS — E7849 Other hyperlipidemia: Secondary | ICD-10-CM | POA: Diagnosis not present

## 2020-03-22 DIAGNOSIS — E039 Hypothyroidism, unspecified: Secondary | ICD-10-CM | POA: Diagnosis present

## 2020-03-22 DIAGNOSIS — N185 Chronic kidney disease, stage 5: Secondary | ICD-10-CM | POA: Diagnosis present

## 2020-03-22 DIAGNOSIS — Z833 Family history of diabetes mellitus: Secondary | ICD-10-CM

## 2020-03-22 DIAGNOSIS — Z6841 Body Mass Index (BMI) 40.0 and over, adult: Secondary | ICD-10-CM | POA: Diagnosis not present

## 2020-03-22 DIAGNOSIS — Z86718 Personal history of other venous thrombosis and embolism: Secondary | ICD-10-CM | POA: Diagnosis not present

## 2020-03-22 DIAGNOSIS — Z66 Do not resuscitate: Secondary | ICD-10-CM | POA: Diagnosis not present

## 2020-03-22 DIAGNOSIS — Z96653 Presence of artificial knee joint, bilateral: Secondary | ICD-10-CM | POA: Diagnosis present

## 2020-03-22 DIAGNOSIS — I10 Essential (primary) hypertension: Secondary | ICD-10-CM | POA: Diagnosis not present

## 2020-03-22 DIAGNOSIS — Z7982 Long term (current) use of aspirin: Secondary | ICD-10-CM | POA: Diagnosis not present

## 2020-03-22 DIAGNOSIS — N189 Chronic kidney disease, unspecified: Secondary | ICD-10-CM | POA: Diagnosis present

## 2020-03-22 DIAGNOSIS — M7989 Other specified soft tissue disorders: Secondary | ICD-10-CM

## 2020-03-22 DIAGNOSIS — R0600 Dyspnea, unspecified: Secondary | ICD-10-CM

## 2020-03-22 DIAGNOSIS — R001 Bradycardia, unspecified: Secondary | ICD-10-CM | POA: Diagnosis present

## 2020-03-22 DIAGNOSIS — Z79899 Other long term (current) drug therapy: Secondary | ICD-10-CM

## 2020-03-22 DIAGNOSIS — Z8249 Family history of ischemic heart disease and other diseases of the circulatory system: Secondary | ICD-10-CM

## 2020-03-22 DIAGNOSIS — N179 Acute kidney failure, unspecified: Secondary | ICD-10-CM | POA: Diagnosis present

## 2020-03-22 DIAGNOSIS — I5033 Acute on chronic diastolic (congestive) heart failure: Secondary | ICD-10-CM | POA: Diagnosis present

## 2020-03-22 DIAGNOSIS — Z823 Family history of stroke: Secondary | ICD-10-CM

## 2020-03-22 DIAGNOSIS — Z20822 Contact with and (suspected) exposure to covid-19: Secondary | ICD-10-CM | POA: Diagnosis present

## 2020-03-22 DIAGNOSIS — M109 Gout, unspecified: Secondary | ICD-10-CM | POA: Diagnosis present

## 2020-03-22 DIAGNOSIS — Z515 Encounter for palliative care: Secondary | ICD-10-CM | POA: Diagnosis not present

## 2020-03-22 LAB — SARS CORONAVIRUS 2 BY RT PCR (HOSPITAL ORDER, PERFORMED IN ~~LOC~~ HOSPITAL LAB): SARS Coronavirus 2: NEGATIVE

## 2020-03-22 LAB — CBC WITH DIFFERENTIAL/PLATELET
Abs Immature Granulocytes: 0.03 10*3/uL (ref 0.00–0.07)
Basophils Absolute: 0 10*3/uL (ref 0.0–0.1)
Basophils Relative: 0 %
Eosinophils Absolute: 0 10*3/uL (ref 0.0–0.5)
Eosinophils Relative: 1 %
HCT: 36.4 % (ref 36.0–46.0)
Hemoglobin: 10.2 g/dL — ABNORMAL LOW (ref 12.0–15.0)
Immature Granulocytes: 1 %
Lymphocytes Relative: 14 %
Lymphs Abs: 0.7 10*3/uL (ref 0.7–4.0)
MCH: 26 pg (ref 26.0–34.0)
MCHC: 28 g/dL — ABNORMAL LOW (ref 30.0–36.0)
MCV: 92.6 fL (ref 80.0–100.0)
Monocytes Absolute: 0.3 10*3/uL (ref 0.1–1.0)
Monocytes Relative: 7 %
Neutro Abs: 3.9 10*3/uL (ref 1.7–7.7)
Neutrophils Relative %: 77 %
Platelets: 158 10*3/uL (ref 150–400)
RBC: 3.93 MIL/uL (ref 3.87–5.11)
RDW: 20.7 % — ABNORMAL HIGH (ref 11.5–15.5)
WBC: 5 10*3/uL (ref 4.0–10.5)
nRBC: 0.8 % — ABNORMAL HIGH (ref 0.0–0.2)

## 2020-03-22 LAB — CBC
HCT: 36.3 % (ref 36.0–46.0)
Hemoglobin: 10 g/dL — ABNORMAL LOW (ref 12.0–15.0)
MCH: 24.9 pg — ABNORMAL LOW (ref 26.0–34.0)
MCHC: 27.5 g/dL — ABNORMAL LOW (ref 30.0–36.0)
MCV: 90.3 fL (ref 80.0–100.0)
Platelets: 160 10*3/uL (ref 150–400)
RBC: 4.02 MIL/uL (ref 3.87–5.11)
RDW: 20.7 % — ABNORMAL HIGH (ref 11.5–15.5)
WBC: 5.1 10*3/uL (ref 4.0–10.5)
nRBC: 1 % — ABNORMAL HIGH (ref 0.0–0.2)

## 2020-03-22 LAB — PROTIME-INR
INR: 1.2 (ref 0.8–1.2)
Prothrombin Time: 14.5 seconds (ref 11.4–15.2)

## 2020-03-22 LAB — COMPREHENSIVE METABOLIC PANEL
ALT: 12 U/L (ref 0–44)
AST: 19 U/L (ref 15–41)
Albumin: 3.3 g/dL — ABNORMAL LOW (ref 3.5–5.0)
Alkaline Phosphatase: 66 U/L (ref 38–126)
Anion gap: 13 (ref 5–15)
BUN: 62 mg/dL — ABNORMAL HIGH (ref 8–23)
CO2: 17 mmol/L — ABNORMAL LOW (ref 22–32)
Calcium: 8.5 mg/dL — ABNORMAL LOW (ref 8.9–10.3)
Chloride: 106 mmol/L (ref 98–111)
Creatinine, Ser: 4.9 mg/dL — ABNORMAL HIGH (ref 0.44–1.00)
GFR calc Af Amer: 8 mL/min — ABNORMAL LOW (ref >60)
GFR calc non Af Amer: 7 mL/min — ABNORMAL LOW (ref >60)
Glucose, Bld: 131 mg/dL — ABNORMAL HIGH (ref 70–99)
Potassium: 5.6 mmol/L — ABNORMAL HIGH (ref 3.5–5.1)
Sodium: 136 mmol/L (ref 135–145)
Total Bilirubin: 0.5 mg/dL (ref 0.3–1.2)
Total Protein: 6 g/dL — ABNORMAL LOW (ref 6.5–8.1)

## 2020-03-22 LAB — BASIC METABOLIC PANEL
Anion gap: 12 (ref 5–15)
BUN: 62 mg/dL — ABNORMAL HIGH (ref 8–23)
CO2: 21 mmol/L — ABNORMAL LOW (ref 22–32)
Calcium: 8.6 mg/dL — ABNORMAL LOW (ref 8.9–10.3)
Chloride: 105 mmol/L (ref 98–111)
Creatinine, Ser: 4.85 mg/dL — ABNORMAL HIGH (ref 0.44–1.00)
GFR calc Af Amer: 9 mL/min — ABNORMAL LOW (ref >60)
GFR calc non Af Amer: 7 mL/min — ABNORMAL LOW (ref >60)
Glucose, Bld: 116 mg/dL — ABNORMAL HIGH (ref 70–99)
Potassium: 5.5 mmol/L — ABNORMAL HIGH (ref 3.5–5.1)
Sodium: 138 mmol/L (ref 135–145)

## 2020-03-22 LAB — TROPONIN I (HIGH SENSITIVITY)
Troponin I (High Sensitivity): 19 ng/L — ABNORMAL HIGH (ref <18)
Troponin I (High Sensitivity): 23 ng/L — ABNORMAL HIGH (ref <18)

## 2020-03-22 LAB — BRAIN NATRIURETIC PEPTIDE: B Natriuretic Peptide: 2522.6 pg/mL — ABNORMAL HIGH (ref 0.0–100.0)

## 2020-03-22 LAB — TSH: TSH: 6.402 u[IU]/mL — ABNORMAL HIGH (ref 0.350–4.500)

## 2020-03-22 LAB — MAGNESIUM: Magnesium: 2.7 mg/dL — ABNORMAL HIGH (ref 1.7–2.4)

## 2020-03-22 MED ORDER — FUROSEMIDE 10 MG/ML IJ SOLN
40.0000 mg | Freq: Once | INTRAMUSCULAR | Status: AC
  Administered 2020-03-22: 40 mg via INTRAVENOUS
  Filled 2020-03-22: qty 4

## 2020-03-22 MED ORDER — SODIUM CHLORIDE 0.9% FLUSH
3.0000 mL | Freq: Two times a day (BID) | INTRAVENOUS | Status: DC
  Administered 2020-03-22 – 2020-03-26 (×10): 3 mL via INTRAVENOUS

## 2020-03-22 MED ORDER — ACETAMINOPHEN 325 MG PO TABS: 650.0000 mg | ORAL_TABLET | ORAL | Status: DC | PRN

## 2020-03-22 MED ORDER — LACTULOSE 10 GM/15ML PO SOLN
10.0000 g | Freq: Every day | ORAL | Status: DC
  Administered 2020-03-22 – 2020-03-26 (×5): 10 g via ORAL
  Filled 2020-03-22 (×6): qty 15

## 2020-03-22 MED ORDER — HYDROCODONE-ACETAMINOPHEN 10-325 MG PO TABS
1.0000 | ORAL_TABLET | ORAL | Status: DC | PRN
  Administered 2020-03-22 – 2020-03-26 (×9): 1 via ORAL
  Filled 2020-03-22 (×9): qty 1

## 2020-03-22 MED ORDER — HYDROXYZINE HCL 50 MG PO TABS
50.0000 mg | ORAL_TABLET | Freq: Every day | ORAL | Status: DC
  Administered 2020-03-22 – 2020-03-26 (×5): 50 mg via ORAL
  Filled 2020-03-22 (×5): qty 1

## 2020-03-22 MED ORDER — FUROSEMIDE 10 MG/ML IJ SOLN
40.0000 mg | Freq: Two times a day (BID) | INTRAMUSCULAR | Status: DC
  Administered 2020-03-22: 40 mg via INTRAVENOUS

## 2020-03-22 MED ORDER — LEVOTHYROXINE SODIUM 75 MCG PO TABS
75.0000 ug | ORAL_TABLET | Freq: Every day | ORAL | Status: DC
  Administered 2020-03-23 – 2020-03-27 (×5): 75 ug via ORAL
  Filled 2020-03-22 (×5): qty 1

## 2020-03-22 MED ORDER — HYDRALAZINE HCL 50 MG PO TABS
125.0000 mg | ORAL_TABLET | Freq: Three times a day (TID) | ORAL | Status: DC
  Administered 2020-03-22 – 2020-03-26 (×13): 125 mg via ORAL
  Filled 2020-03-22 (×10): qty 1
  Filled 2020-03-22: qty 2
  Filled 2020-03-22 (×3): qty 1

## 2020-03-22 MED ORDER — METOPROLOL SUCCINATE ER 25 MG PO TB24
12.5000 mg | ORAL_TABLET | Freq: Every day | ORAL | Status: DC
  Administered 2020-03-24 – 2020-03-26 (×2): 12.5 mg via ORAL
  Filled 2020-03-22 (×5): qty 1

## 2020-03-22 MED ORDER — SODIUM CHLORIDE 0.9 % IV SOLN: 250.0000 mL | INTRAVENOUS | Status: DC | PRN

## 2020-03-22 MED ORDER — ALLOPURINOL 100 MG PO TABS
100.0000 mg | ORAL_TABLET | Freq: Every day | ORAL | Status: DC
  Administered 2020-03-22: 100 mg via ORAL
  Filled 2020-03-22 (×2): qty 1

## 2020-03-22 MED ORDER — ASPIRIN EC 81 MG PO TBEC
81.0000 mg | DELAYED_RELEASE_TABLET | Freq: Every day | ORAL | Status: DC
  Administered 2020-03-23 – 2020-03-26 (×4): 81 mg via ORAL
  Filled 2020-03-22 (×5): qty 1

## 2020-03-22 MED ORDER — ISOSORBIDE MONONITRATE ER 60 MG PO TB24
120.0000 mg | ORAL_TABLET | Freq: Every day | ORAL | Status: DC
  Administered 2020-03-23 – 2020-03-26 (×4): 120 mg via ORAL
  Filled 2020-03-22 (×5): qty 2

## 2020-03-22 MED ORDER — FERROUS SULFATE 325 (65 FE) MG PO TABS
325.0000 mg | ORAL_TABLET | Freq: Every day | ORAL | Status: DC
  Administered 2020-03-22 – 2020-03-26 (×5): 325 mg via ORAL
  Filled 2020-03-22 (×5): qty 1

## 2020-03-22 MED ORDER — SODIUM CHLORIDE 0.9% FLUSH: 3.0000 mL | INTRAVENOUS | Status: DC | PRN

## 2020-03-22 MED ORDER — HEPARIN SODIUM (PORCINE) 5000 UNIT/ML IJ SOLN
5000.0000 [IU] | Freq: Three times a day (TID) | INTRAMUSCULAR | Status: DC
  Administered 2020-03-22 – 2020-03-27 (×13): 5000 [IU] via SUBCUTANEOUS
  Filled 2020-03-22 (×14): qty 1

## 2020-03-22 MED ORDER — DOCUSATE SODIUM 100 MG PO CAPS: 100.0000 mg | ORAL_CAPSULE | Freq: Every evening | ORAL | Status: DC | PRN

## 2020-03-22 MED ORDER — SODIUM ZIRCONIUM CYCLOSILICATE 10 G PO PACK
10.0000 g | PACK | Freq: Once | ORAL | Status: AC
  Administered 2020-03-22: 10 g via ORAL
  Filled 2020-03-22: qty 1

## 2020-03-22 MED ORDER — AMLODIPINE BESYLATE 10 MG PO TABS
10.0000 mg | ORAL_TABLET | Freq: Every morning | ORAL | Status: DC
  Administered 2020-03-23 – 2020-03-26 (×4): 10 mg via ORAL
  Filled 2020-03-22 (×4): qty 1

## 2020-03-22 MED ORDER — TRAZODONE HCL 100 MG PO TABS
100.0000 mg | ORAL_TABLET | Freq: Every evening | ORAL | Status: DC | PRN
  Administered 2020-03-22: 100 mg via ORAL
  Filled 2020-03-22: qty 1

## 2020-03-22 MED ORDER — ROSUVASTATIN CALCIUM 5 MG PO TABS
10.0000 mg | ORAL_TABLET | Freq: Every evening | ORAL | Status: DC
  Administered 2020-03-22 – 2020-03-26 (×5): 10 mg via ORAL
  Filled 2020-03-22 (×5): qty 2

## 2020-03-22 MED ORDER — FUROSEMIDE 10 MG/ML IJ SOLN
80.0000 mg | Freq: Two times a day (BID) | INTRAMUSCULAR | Status: DC
  Administered 2020-03-23 – 2020-03-24 (×4): 80 mg via INTRAVENOUS
  Filled 2020-03-22 (×5): qty 8

## 2020-03-22 MED ORDER — ONDANSETRON HCL 4 MG/2ML IJ SOLN: 4.0000 mg | Freq: Four times a day (QID) | INTRAMUSCULAR | Status: DC | PRN

## 2020-03-22 NOTE — ED Triage Notes (Signed)
PT from home with a increased SHOB. Pt has CHF  And was inpatient at [facility] recently for same [facility]. Pt arrived on 100% non-re beather. SPO2 100%. Per request of EDP  A nasal O2  Started at 4 liters. Pt's SPO2 96% on 4 liters O2.

## 2020-03-22 NOTE — Consult Note (Signed)
Kumiko Fishman Admit Date: 03/22/2020 03/22/2020 Gean Quint Requesting Physician:  Early Osmond  Reason for Consult:  aki on ckd HPI:  86 year old female with past medical history of diastolic CHF, CKD, anemia of chronic disease, hypertension, hyperlipidemia, hypothyroidism, gout, history of DVT, morbid obesity who presents to the ER with worsening shortness of breath since yesterday.  She had noticed difficulty in breathing first with exertion was also associated with worsening leg swelling (along with pain).  She also gained about 4 pounds despite taking torsemide at night.  She had been in touch with her office in order to help with her p.o. diuretics.  She was admitted at [facility] about a month ago with similar symptoms and at that time her torsemide was held at the time of discharge due to her kidney function deteriorating.  She was started back on torsemide as an outpatient in order to help manage her volume status.  She chronically sleeps up due to chronic orthopnea. Otherwise patient denies any nausea/vomiting, fevers, chest pain, abdominal pain, loss of appetite, changes in urinary frequency, dizziness.  PMH Incudes: Past Medical History:  Diagnosis Date  . Arthritis   . Chronic diastolic CHF (congestive heart failure) ([place])    a. Echo 01/02/16: Moderate concentric LVH, vigorous LV function, EF 65-70%, normal wall motion, grade 3 diastolic dysfunction, calcified aortic valve, MAC, mild MR, mild LAE  . CKD (chronic kidney disease)    Sees Dr. Moshe Cipro who took her off benicar in 2013.             Marland Kitchen Heart murmur    long time ago--no problems  . Hypercholesteremia   . Hypertensive heart and kidney disease with heart failure and with chronic kidney disease stage IV (HCC)        Creat (mg/dL)  Date Value  01/15/2016 2.90 (H)   Creatinine, Ser (mg/dL)  Date Value  03/22/2020 4.90 (H)  02/24/2020 3.48 (H)  02/23/2020 3.34 (H)  02/22/2020 3.47 (H)  02/21/2020 3.15 (H)   02/20/2020 2.97 (H)  02/19/2020 2.55 (H)  02/18/2020 2.65 (H)  02/17/2020 2.54 (H)  02/16/2020 2.85 (H)  ] I/Os:  ROS Balance of 12 systems is negative w/ exceptions as above  PMH  Past Medical History:  Diagnosis Date  . Arthritis   . Chronic diastolic CHF (congestive heart failure) ([place])    a. Echo 01/02/16: Moderate concentric LVH, vigorous LV function, EF 65-70%, normal wall motion, grade 3 diastolic dysfunction, calcified aortic valve, MAC, mild MR, mild LAE  . CKD (chronic kidney disease)    Sees Dr. Moshe Cipro who took her off benicar in 2013.             Marland Kitchen Heart murmur    long time ago--no problems  . Hypercholesteremia   . Hypertensive heart and kidney disease with heart failure and with chronic kidney disease stage IV (HCC)    PSH  Past Surgical History:  Procedure Laterality Date  . CATARACT EXTRACTION W/PHACO Right 03/20/2014   Procedure: CATARACT EXTRACTION PHACO AND INTRAOCULAR LENS PLACEMENT (IOC);  Surgeon: [name], MD;  Location: [facility];  Service: Ophthalmology;  Laterality: Right;  . failed knee surgery     on the right x 2  . JOINT REPLACEMENT     both knees  . left ankle orif     FH  Family History  Problem Relation Age of Onset  . Hypertension Mother   . Stroke Father   . Diabetes Mellitus II Daughter   .  Hypertension Daughter   . Heart attack Neg Hx    SH  reports that she has never smoked. She has never used smokeless tobacco. She reports current alcohol use of about 1.0 standard drink of alcohol per week. She reports that she does not use drugs. Allergies  Allergies  Allergen Reactions  . Naprosyn [Naproxen] Itching and Other (See Comments)    Hot burning feeling, also   Home medications Prior to Admission medications   Medication Sig Start Date End Date Taking? Authorizing Provider  allopurinol (ZYLOPRIM) 100 MG tablet Take 0.5 tablets (50 mg total) by mouth 2 (two) times a week. Patient taking differently: Take 100 mg by mouth at  bedtime.  02/25/20  Yes Mariel Aloe, MD  amLODipine (NORVASC) 10 MG tablet Take 10 mg by mouth in the morning.   Yes [provider]  aspirin EC 81 MG tablet Take 81 mg by mouth daily.   Yes [provider]  docusate sodium (COLACE) 100 MG capsule Take 100 mg by mouth at bedtime as needed for mild constipation.   Yes [provider]  ferrous sulfate 325 (65 FE) MG tablet Take 325 mg by mouth at bedtime.    Yes [provider]  hydrALAZINE (APRESOLINE) 50 MG tablet Take 2.5 tablets (125 mg total) by mouth 3 (three) times daily. 02/24/20 03/25/20 Yes Mariel Aloe, MD  HYDROcodone-acetaminophen (NORCO) 10-325 MG tablet Take 1 tablet by mouth every 4 (four) hours as needed for moderate pain.    Yes [provider]  hydrOXYzine (VISTARIL) 50 MG capsule Take 50 mg by mouth at bedtime.   Yes [provider]  isosorbide mononitrate (IMDUR) 120 MG 24 hr tablet Take 1 tablet (120 mg total) by mouth daily. 11/07/19 11/06/20 Yes Nahser, Wonda Cheng, MD  lactulose (CHRONULAC) 10 GM/15ML solution Take 10 g by mouth See admin instructions. Take 10 grams by mouth once a day and hold for diarrhea   Yes [provider]  levothyroxine (SYNTHROID, LEVOTHROID) 75 MCG tablet Take 75 mcg by mouth daily before breakfast.  06/08/18  Yes [provider]  metoprolol succinate (TOPROL-XL) 25 MG 24 hr tablet Take 0.5 tablets (12.5 mg total) by mouth daily. Patient taking differently: Take 12.5 mg by mouth in the morning.  02/25/20  Yes Mariel Aloe, MD  Multiple Vitamin (MULTIVITAMIN WITH MINERALS) TABS tablet Take 1 tablet by mouth daily.   Yes [provider]  rosuvastatin (CRESTOR) 10 MG tablet Take 10 mg by mouth every evening.   Yes [provider]  torsemide (DEMADEX) 20 MG tablet Take 40 mg by mouth in the morning.  03/18/20  Yes [provider]  traZODone (DESYREL) 100 MG tablet Take 100 mg by mouth at bedtime as needed for  sleep.   Yes [provider]    Current Medications Scheduled Meds: . allopurinol  100 mg Oral QHS  . [START ON 03/23/2020] amLODipine  10 mg Oral q AM  . [START ON 03/23/2020] aspirin EC  81 mg Oral Daily  . ferrous sulfate  325 mg Oral QHS  . [START ON 03/23/2020] furosemide  80 mg Intravenous Q12H  . heparin  5,000 Units Subcutaneous Q8H  . hydrALAZINE  125 mg Oral TID  . hydrOXYzine  50 mg Oral QHS  . [START ON 03/23/2020] isosorbide mononitrate  120 mg Oral Daily  . lactulose  10 g Oral Q2000  . [START ON 03/23/2020] levothyroxine  75 mcg Oral QAC breakfast  . Derrill Memo  ON 03/23/2020] metoprolol succinate  12.5 mg Oral Daily  . rosuvastatin  10 mg Oral QPM  . sodium chloride flush  3 mL Intravenous Q12H  . sodium zirconium cyclosilicate  10 g Oral Once   Continuous Infusions: . sodium chloride     PRN Meds:.sodium chloride, acetaminophen, docusate sodium, HYDROcodone-acetaminophen, ondansetron (ZOFRAN) IV, sodium chloride flush, traZODone  CBC Recent Labs  Lab 03/22/20 1257  WBC 5.0  NEUTROABS 3.9  HGB 10.2*  HCT 36.4  MCV 92.6  PLT 191   Basic Metabolic Panel Recent Labs  Lab 03/22/20 1257  NA 136  K 5.6*  CL 106  CO2 17*  GLUCOSE 131*  BUN 62*  CREATININE 4.90*  CALCIUM 8.5*    Physical Exam  Blood pressure (!) 139/53, pulse 53, temperature 98.4 F (36.9 C), temperature source Oral, resp. rate (!) 11, SpO2 97 %. GEN: sitting up in bed, nad ENT: no nasal discharge, mmm EYES: no scleral icterus, eomi CV: s1s2, rrr, no murmurs/rubs/gallops PULM: diminished air entry bibasilar, slightly increased WOB, bl chest expansion, on [place] ABD: NABS, non-distended SKIN: no rashes or jaundice EXT: ++edema bl le's Neuro: speech clear and coherent, no focal deficits, aaox3   Assessment 1.  AKI on CKD5 (likely progressed to CKD5).  Largely fluctuant baseline renal function in the past, baseline creatinine have been around 2.5 or so but recently has been steadily  climbing.  Her current etiology of AKI is best explained by decreased effective arterial blood flow in the setting of renovascular congestion 2.  Acute on chronic diastolic CHF 3.  Metabolic acidosis likely secondary to #1 4.  Hyperkalemia.   5. Anemia of chronic kidney disease (normocytic) 6.  CKD MBD  Plan 1. Recommend diuresing her with Lasix 80 mg twice daily (IV).  Agree with Lokelma and would hold on further doses.  Monitor with diuresis and if needed (potassium greater than 6) then would restart Lokelma 2. Monitor daily labs 3. Repeat renal ultrasound 4. Check Phos and PTH 5. Check iron panel 6. No indication for renal replacement therapy currently.  If this is to be considered or needed, then would recommend goals of care discussion and palliative care discussions first. Daughter at the bedside does report that she would like her mother to try dialysis but is amenable to talking to palliative care as she understands the scope of their practice 7. Renal diet 8. Avoid nephrotoxic medications including NSAIDs and iodinated intravenous contrast exposure unless the latter is absolutely indicated.  Preferred narcotic agents for pain control are hydromorphone, fentanyl, and methadone. Morphine should not be used. Avoid Baclofen and avoid oral sodium phosphate and magnesium citrate based laxatives / bowel preps. Continue strict Input and Output monitoring. Will monitor the patient closely with you and intervene or adjust therapy as indicated by changes in clinical status/labs  Plans communicated to the primary team   Gean Quint, MD [place] 03/22/2020, 3:03 PM

## 2020-03-22 NOTE — H&P (Signed)
History and Physical    [name] STM:[id number] DOB: 1929-07-11 DOA: 03/22/2020  PCP: Antonietta Jewel, MD  Patient coming from: Home I have personally briefly reviewed patient's old medical records in [place]  Chief Complaint: Shortness of breath since yesterday HPI: [name] is a 86 y.o. female with medical history significant of diastolic CHF, CKD stage IV, anemia of chronic disease, hypertension, hyperlipidemia, hypothyroidism, gout, history of DVT, morbid obesity presents to emergency department with worsening shortness of breath since yesterday.  Patient tells me that she has difficulty in breathing which is associated with exertion, orthopnea, PND, leg worsening leg swelling and pain.  She has gained about 4 to 5 pounds since yesterday.  Reports dry cough however denies chest pain, wheezing, chest congestion, fever, chills, nausea, vomiting, diarrhea, headache, blurry vision, dizziness, lightheadedness, urinary or bowel changes.  She lives with her daughter at home.  Has home health and PT who visits her couple of times in a week.  She has limited mobility.  Denies recent travel, erythema in legs.  She had DVT in the past however she is not on any anticoagulation.  No history of smoking, alcohol, listed drug use.  Daughter at bedside reports that patient is compliant with her home medications as well as low-sodium and renal diet.   Patient admitted at [facility] last month with similar symptoms. Her torsemide was held at the time of discharge due to worsening kidney function as per nephrology recommendation.  Patient daughters tells me that patient was restarted back on torsemide 40 mg once daily as per nephrology recommendation on 7/19.  ED Course: Upon arrival of EMS: Patient was placed on nonrebreather.  She was maintaining oxygen saturation 100% on room nonrebreather.  In ED she was placed on 4 L of oxygen and was maintaining 96%.  Heart rate in 50s, afebrile, no  leukocytosis.  CMP shows potassium of 5.6, worsening kidney function, troponin XIX, BNP: 2522, COVID-19 pending.  Chest x-ray shows worsening CHF.  Patient received Lasix 40 IV once in ED.  [facility] hospitalist consulted for admission for acute hypoxemic respiratory failure secondary to acute on chronic diastolic CHF exacerbation.    Review of Systems: As per HPI otherwise negative.    Past Medical History:  Diagnosis Date  . Arthritis   . Chronic diastolic CHF (congestive heart failure) ([place])    a. Echo 01/02/16: Moderate concentric LVH, vigorous LV function, EF 65-70%, normal wall motion, grade 3 diastolic dysfunction, calcified aortic valve, MAC, mild MR, mild LAE  . CKD (chronic kidney disease)    Sees Dr. Moshe Cipro who took her off benicar in 2013.             Marland Kitchen Heart murmur    long time ago--no problems  . Hypercholesteremia   . Hypertensive heart and kidney disease with heart failure and with chronic kidney disease stage IV [facility])     Past Surgical History:  Procedure Laterality Date  . CATARACT EXTRACTION W/PHACO Right 03/20/2014   Procedure: CATARACT EXTRACTION PHACO AND INTRAOCULAR LENS PLACEMENT (IOC);  Surgeon: [name], MD;  Location: [facility];  Service: Ophthalmology;  Laterality: Right;  . failed knee surgery     on the right x 2  . JOINT REPLACEMENT     both knees  . left ankle orif       reports that she has never smoked. She has never used smokeless tobacco. She reports current alcohol use of about 1.0 standard drink of alcohol per week.  She reports that she does not use drugs.  Allergies  Allergen Reactions  . Naprosyn [Naproxen] Other (See Comments)    Hot burning feeling    Family History  Problem Relation Age of Onset  . Hypertension Mother   . Stroke Father   . Diabetes Mellitus II Daughter   . Hypertension Daughter   . Heart attack Neg Hx     Prior to Admission medications   Medication Sig Start Date End Date Taking? Authorizing Provider    allopurinol (ZYLOPRIM) 100 MG tablet Take 0.5 tablets (50 mg total) by mouth 2 (two) times a week. 02/25/20   Mariel Aloe, MD  aspirin EC 81 MG tablet Take 81 mg by mouth daily.    [provider]  ferrous sulfate 325 (65 FE) MG tablet Take 325 mg by mouth daily in the afternoon.     [provider]  hydrALAZINE (APRESOLINE) 50 MG tablet Take 2.5 tablets (125 mg total) by mouth 3 (three) times daily. 02/24/20 03/25/20  Mariel Aloe, MD  HYDROcodone-acetaminophen (NORCO) 10-325 MG tablet Take 1 tablet by mouth every 4 (four) hours as needed for moderate pain.     [provider]  isosorbide mononitrate (IMDUR) 120 MG 24 hr tablet Take 1 tablet (120 mg total) by mouth daily. 11/07/19 11/06/20  Nahser, Wonda Cheng, MD  lactulose (CHRONULAC) 10 GM/15ML solution Take 10 g by mouth daily as needed for mild constipation.    [provider]  levothyroxine (SYNTHROID, LEVOTHROID) 75 MCG tablet Take 75 mcg by mouth daily. 06/08/18   [provider]  metoprolol succinate (TOPROL-XL) 25 MG 24 hr tablet Take 0.5 tablets (12.5 mg total) by mouth daily. 02/25/20   Mariel Aloe, MD  Multiple Vitamin (MULTIVITAMIN WITH MINERALS) TABS tablet Take 1 tablet by mouth daily.    [provider]  rosuvastatin (CRESTOR) 10 MG tablet Take 10 mg by mouth every evening.    [provider]  traZODone (DESYREL) 100 MG tablet Take 100 mg by mouth at bedtime as needed for sleep.    [provider]    Physical Exam: Vitals:   03/22/20 1240 03/22/20 1330 03/22/20 1332 03/22/20 1347  BP:  (!) 150/55 (!) 150/55 (!) 139/53  Pulse:  53 55 53  Resp:  (!) 26 23 (!) 11  Temp:      TempSrc:      SpO2: 96% 96% 95% 97%    Constitutional: NAD, calm, comfortable, on 4 L of oxygen via nasal cannula, morbidly obese Eyes: PERRL, lids and conjunctivae normal ENMT: Mucous membranes are moist. Posterior pharynx clear of any exudate or lesions.Normal dentition.   Neck: normal, supple, no masses, no thyromegaly Respiratory: Rales noted on the bases Cardiovascular: Regular rate and rhythm, no murmurs / rubs / gallops.  3+ pitting edema positive. 2+ pedal pulses. No carotid bruits.  Abdomen: no tenderness, no masses palpated. No hepatosplenomegaly. Bowel sounds positive.  Musculoskeletal: no clubbing / cyanosis. No joint deformity upper and lower extremities. Good ROM, no contractures. Normal muscle tone.  Skin: no rashes, lesions, ulcers. No induration Neurologic: CN 2-12 grossly intact. Sensation intact, DTR normal. Strength 5/5 in all 4.  Psychiatric: Normal judgment and insight. Alert and oriented x 3. Normal mood.    Labs on Admission: I have personally reviewed following labs and imaging studies  CBC: Recent Labs  Lab 03/22/20 1257  WBC 5.0  NEUTROABS 3.9  HGB 10.2*  HCT 36.4  MCV 92.6  PLT 158  Basic Metabolic Panel: Recent Labs  Lab 03/22/20 1257  NA 136  K 5.6*  CL 106  CO2 17*  GLUCOSE 131*  BUN 62*  CREATININE 4.90*  CALCIUM 8.5*   GFR: CrCl cannot be calculated (Unknown ideal weight.). Liver Function Tests: Recent Labs  Lab 03/22/20 1257  AST 19  ALT 12  ALKPHOS 66  BILITOT 0.5  PROT 6.0*  ALBUMIN 3.3*   No results for input(s): LIPASE, AMYLASE in the last 168 hours. No results for input(s): AMMONIA in the last 168 hours. Coagulation Profile: Recent Labs  Lab 03/22/20 1257  INR 1.2   Cardiac Enzymes: No results for input(s): CKTOTAL, CKMB, CKMBINDEX, TROPONINI in the last 168 hours. BNP (last 3 results) No results for input(s): PROBNP in the last 8760 hours. HbA1C: No results for input(s): HGBA1C in the last 72 hours. CBG: No results for input(s): GLUCAP in the last 168 hours. Lipid Profile: No results for input(s): CHOL, HDL, LDLCALC, TRIG, CHOLHDL, LDLDIRECT in the last 72 hours. Thyroid Function Tests: No results for input(s): TSH, T4TOTAL, FREET4, T3FREE, THYROIDAB in the last 72  hours. Anemia Panel: No results for input(s): VITAMINB12, FOLATE, FERRITIN, TIBC, IRON, RETICCTPCT in the last 72 hours. Urine analysis:    Component Value Date/Time   COLORURINE YELLOW 02/22/2020 1842   APPEARANCEUR CLEAR 02/22/2020 1842   LABSPEC 1.014 02/22/2020 1842   PHURINE 6.0 02/22/2020 1842   GLUCOSEU NEGATIVE 02/22/2020 1842   HGBUR NEGATIVE 02/22/2020 1842   BILIRUBINUR NEGATIVE 02/22/2020 1842   KETONESUR NEGATIVE 02/22/2020 1842   PROTEINUR >=300 (A) 02/22/2020 1842   UROBILINOGEN 0.2 11/21/2014 2230   NITRITE NEGATIVE 02/22/2020 1842   LEUKOCYTESUR NEGATIVE 02/22/2020 1842    Radiological Exams on Admission: DG Chest Port 1 View  Result Date: 03/22/2020 CLINICAL DATA:  Shortness of breath, CHF EXAM: PORTABLE CHEST 1 VIEW COMPARISON:  02/21/2020 FINDINGS: Slight worsening pulmonary edema pattern throughout the mid and lower lungs with further obscuration of the cardiac borders. Pleural effusions noted bilaterally. Associated bibasilar atelectasis/partial collapse. No pneumothorax. Trachea midline. Aorta atherosclerotic. Degenerative changes of the spine. IMPRESSION: Worsening CHF pattern Electronically Signed   By: Jerilynn Mages.  Shick M.D.   On: 03/22/2020 13:10    EKG: Independently reviewed.  Sinus bradycardia.  Ventricular premature complexes.  Borderline right axis deviation.  No ST elevation or depression noted. Assessment/Plan Principal Problem:   Acute respiratory failure with hypoxia (HCC) Active Problems:   Hyperkalemia   Metabolic acidosis   Renal failure (ARF), acute on chronic (HCC)   Hypertension   Sinus bradycardia   Acute on chronic diastolic heart failure (HCC)   HLD (hyperlipidemia)   Depression   Hypothyroidism   Acute hypoxemic respiratory failure: In the setting of acute on chronic diastolic CHF: -Patient presented with shortness of breath, weight gain, orthopnea, PND and worsening leg swelling since yesterday.  BNP: 2522, chest x-ray shows worsening  CHF.  She is afebrile with no leukocytosis.  COVID-19 is pending.  Troponin: 19. -Admit patient stepdown unit for close monitoring.  On continuous pulse ox.  We will try to wean off of oxygen as tolerated. -Reviewed echo from 02/17/2020 which showed ejection fraction of 65 to 70% with grade 2 diastolic dysfunction.  Severely elevated PA systolic pressure.  Mild to moderate aortic valve calcification without aortic stenosis. -Start on Lasix 80 IV twice daily.  Strict INO's and daily weight.  Monitor electrolytes closely.   -Monitor kidney function closely.  Monitor vitals closely. -Continue home meds aspirin, statin, metoprolol,  Imdur  Bilateral lower extremity swelling and pain: -Patient has history of DVT in the past. -Not on anticoagulation currently.  Has limited mobility as well.  Will get Doppler ultrasound of bilateral lower extremity to rule out DVT.  Acute on chronic kidney failure stage IV: -Likely secondary to cardiorenal syndrome. -Continue diuresis.  Monitor kidney function closely. -Consulted nephrology  Hyperkalemia: Potassium 5.6.  No acute EKG changes.  Will give a dose of Lokelma.  Check magnesium level.  Repeat BMP tomorrow a.m.  Sinus bradycardia: Heart rate in 50s. -Likely secondary to metoprolol.  -On telemetry.  Continue to monitor.  Check TSH.  Hypertension: Stable -Continue amlodipine, metoprolol, Imdur  Hyperlipidemia: Continue statin  Hypothyroidism: Check TSH.  Continue levothyroxine  Gout: Continue allopurinol  Anemia of chronic disease: H&H is stable.  Continue ferrous sulfate.  Depression/anxiety: Continue trazodone, hydroxyzine as needed  DVT prophylaxis: Heparin subcu/SCD Code Status: Full code-confirmed with the patient's daughter Family Communication: Patient's daughter  present at bedside.  Plan of care discussed with patient in length and he verbalized understanding and agreed with it. Disposition Plan: Home after diuresis Consults called:  Nephrology Admission status: Inpatient  Mckinley Jewel MD [facility]  If 7PM-7AM, please contact night-coverage [email or web address] Password [facility]  03/22/2020, 2:07 PM

## 2020-03-22 NOTE — Progress Notes (Signed)
Bilateral lower extremity venous duplex has been completed. Preliminary results can be found in CV Proc through chart review.   03/22/20 3:05 PM [name] RVT

## 2020-03-22 NOTE — ED Provider Notes (Signed)
[facility] EMERGENCY DEPARTMENT Provider Note   CSN: [id number] Arrival date & time: 03/22/20  1202     History Chief Complaint  Patient presents with  . Shortness of Breath    [name] is a 86 y.o. female.  73-year-old female with prior medical history as detailed below presents for evaluation of shortness of breath.  Patient reports gradually worsening shortness of breath over the last 3 to 4 days.  She reports increased edema to both lower extremities.  She reports 4 to 5 pound weight gain over the last 24 hours.  She reports full compliance with her typical medications including diuretics.  She reports prior history of CHF exacerbation requiring admission for diuresis.  She denies recent fever.  She denies recent cough.  She denies stated chest pain.  She reports that she has been vaccinated previously for Covid.  EMS reported room air sats in the 70s at home.  Patient denies need for home O2.  The history is provided by the patient and a relative.  Shortness of Breath Severity:  Moderate Onset quality:  Gradual Duration:  4 days Timing:  Intermittent Progression:  Worsening Chronicity:  Recurrent Relieved by:  Nothing Worsened by:  Nothing      Past Medical History:  Diagnosis Date  . Arthritis   . Chronic diastolic CHF (congestive heart failure) ([place])    a. Echo 01/02/16: Moderate concentric LVH, vigorous LV function, EF 65-70%, normal wall motion, grade 3 diastolic dysfunction, calcified aortic valve, MAC, mild MR, mild LAE  . CKD (chronic kidney disease)    Sees Dr. Moshe Cipro who took her off benicar in 2013.             Marland Kitchen Heart murmur    long time ago--no problems  . Hypercholesteremia   . Hypertensive heart and kidney disease with heart failure and with chronic kidney disease stage IV [facility])     Patient Active Problem List   Diagnosis Date Noted  . AKI (acute kidney injury) ([place]) 02/24/2020  . Acute on chronic diastolic heart  failure ([place]) 02/16/2020  . Hypertension 01/03/2018  . Atypical chest pain 01/03/2018  . Sinus bradycardia 01/03/2018  . Hypertensive heart and kidney disease with heart failure and with chronic kidney disease stage IV ([place])   . Fever, unspecified 01/06/2016  . Acute respiratory failure with hypoxia ([place]) 01/01/2016  . Chronic diastolic heart failure ([place]) 01/01/2016  . History of DVT (deep vein thrombosis) 01/01/2016  . Chronic kidney disease, stage IV (severe) ([place]) 01/01/2016  . Anemia of renal disease 01/01/2016  . Iron deficiency anemia 01/01/2016  . Hyperkalemia 11/21/2014  . Diarrhea 11/21/2014  . Metabolic acidosis 81/05/3158  . Renal failure (ARF), acute on chronic ([place]) 11/21/2014    Past Surgical History:  Procedure Laterality Date  . CATARACT EXTRACTION W/PHACO Right 03/20/2014   Procedure: CATARACT EXTRACTION PHACO AND INTRAOCULAR LENS PLACEMENT (IOC);  Surgeon: [name], MD;  Location: [facility];  Service: Ophthalmology;  Laterality: Right;  . failed knee surgery     on the right x 2  . JOINT REPLACEMENT     both knees  . left ankle orif       OB History   No obstetric history on file.     Family History  Problem Relation Age of Onset  . Hypertension Mother   . Stroke Father   . Diabetes Mellitus II Daughter   . Hypertension Daughter   . Heart attack Neg Hx  Social History   Tobacco Use  . Smoking status: Never Smoker  . Smokeless tobacco: Never Used  Vaping Use  . Vaping Use: Never used  Substance Use Topics  . Alcohol use: Yes    Alcohol/week: 1.0 standard drink    Types: 1 Standard drinks or equivalent per week    Comment: occasional  . Drug use: No    Home Medications Prior to Admission medications   Medication Sig Start Date End Date Taking? Authorizing Provider  allopurinol (ZYLOPRIM) 100 MG tablet Take 0.5 tablets (50 mg total) by mouth 2 (two) times a week. 02/25/20   Mariel Aloe, MD  aspirin EC 81 MG tablet Take 81 mg by mouth  daily.    [provider]  ferrous sulfate 325 (65 FE) MG tablet Take 325 mg by mouth daily in the afternoon.     [provider]  hydrALAZINE (APRESOLINE) 50 MG tablet Take 2.5 tablets (125 mg total) by mouth 3 (three) times daily. 02/24/20 03/25/20  Mariel Aloe, MD  HYDROcodone-acetaminophen (NORCO) 10-325 MG tablet Take 1 tablet by mouth every 4 (four) hours as needed for moderate pain.     [provider]  isosorbide mononitrate (IMDUR) 120 MG 24 hr tablet Take 1 tablet (120 mg total) by mouth daily. 11/07/19 11/06/20  Nahser, Wonda Cheng, MD  lactulose (CHRONULAC) 10 GM/15ML solution Take 10 g by mouth daily as needed for mild constipation.    [provider]  levothyroxine (SYNTHROID, LEVOTHROID) 75 MCG tablet Take 75 mcg by mouth daily. 06/08/18   [provider]  metoprolol succinate (TOPROL-XL) 25 MG 24 hr tablet Take 0.5 tablets (12.5 mg total) by mouth daily. 02/25/20   Mariel Aloe, MD  Multiple Vitamin (MULTIVITAMIN WITH MINERALS) TABS tablet Take 1 tablet by mouth daily.    [provider]  rosuvastatin (CRESTOR) 10 MG tablet Take 10 mg by mouth every evening.    [provider]  traZODone (DESYREL) 100 MG tablet Take 100 mg by mouth at bedtime as needed for sleep.    [provider]    Allergies    Naprosyn [naproxen]  Review of Systems   Review of Systems  Respiratory: Positive for shortness of breath.   All other systems reviewed and are negative.   Physical Exam Updated Vital Signs BP (!) 155/52 (BP Location: Left Arm)   Pulse 48   Temp 98.4 F (36.9 C) (Oral)   Resp 17   SpO2 96%   Physical Exam Vitals and nursing note reviewed.  Constitutional:      General: She is not in acute distress.    Appearance: She is well-developed.  HENT:     Head: Normocephalic and atraumatic.  Eyes:     Conjunctiva/sclera: Conjunctivae normal.     Pupils: Pupils are equal, round, and reactive to light.    Cardiovascular:     Rate and Rhythm: Normal rate and regular rhythm.     Heart sounds: Normal heart sounds.  Pulmonary:     Effort: Pulmonary effort is normal. No respiratory distress.     Breath sounds: Normal breath sounds.  Abdominal:     General: There is no distension.     Palpations: Abdomen is soft.     Tenderness: There is no abdominal tenderness.  Musculoskeletal:        General: No deformity. Normal range of motion.     Cervical back: Normal range of motion and neck supple.  Skin:  General: Skin is warm and dry.  Neurological:     Mental Status: She is alert and oriented to person, place, and time.     ED Results / Procedures / Treatments   Labs (all labs ordered are listed, but only abnormal results are displayed) Labs Reviewed  SARS CORONAVIRUS 2 BY RT PCR (HOSPITAL ORDER, [facility])  COMPREHENSIVE METABOLIC PANEL  CBC WITH DIFFERENTIAL/PLATELET  PROTIME-INR  BRAIN NATRIURETIC PEPTIDE  TROPONIN I (HIGH SENSITIVITY)    EKG EKG Interpretation  Date/Time:  Saturday March 22 2020 12:04:44 EDT Ventricular Rate:  48 PR Interval:    QRS Duration: 87 QT Interval:  506 QTC Calculation: 453 R Axis:   87 Text Interpretation: Sinus bradycardia Ventricular premature complex Borderline right axis deviation Consider left ventricular hypertrophy Abnormal T, consider ischemia, anterior leads Confirmed by Dene Gentry [id number]) on 03/22/2020 12:11:50 PM   Radiology No results found.  Procedures Procedures (including critical care time)  Medications Ordered in ED Medications  furosemide (LASIX) injection 40 mg (has no administration in time range)    ED Course  I have reviewed the triage vital signs and the nursing notes.  Pertinent labs & imaging results that were available during my care of the patient were reviewed by me and considered in my medical decision making (see chart for details).    MDM Rules/Calculators/A&P                           MDM  Screen complete  Shaquanta Harkless was evaluated in Emergency Department on 03/22/2020 for the symptoms described in the history of present illness. She was evaluated in the context of the global COVID-19 pandemic, which necessitated consideration that the patient might be at risk for infection with the SARS-CoV-2 virus that causes COVID-19. Institutional protocols and algorithms that pertain to the evaluation of patients at risk for COVID-19 are in a state of rapid change based on information released by regulatory bodies including the CDC and federal and state organizations. These policies and algorithms were followed during the patient's care in the ED.  Patient is presenting for evaluation of worsening shortness of breath.  Her presentation is consistent with likely CHF exacerbation.  Work-up reveals evidence of CHF exacerbation on chest x-ray.  Patient requires 4 L nasal cannula supplemental O2.   Patient would benefit from admission for further treatment and evaluation.  Hospitalist service is aware of case and will evaluate for admission.  Final Clinical Impression(s) / ED Diagnoses Final diagnoses:  Dyspnea, unspecified type    Rx / DC Orders ED Discharge Orders    None       Valarie Merino, MD 03/22/20 1407

## 2020-03-23 DIAGNOSIS — J9601 Acute respiratory failure with hypoxia: Secondary | ICD-10-CM | POA: Diagnosis not present

## 2020-03-23 LAB — BASIC METABOLIC PANEL
Anion gap: 11 (ref 5–15)
BUN: 57 mg/dL — ABNORMAL HIGH (ref 8–23)
CO2: 22 mmol/L (ref 22–32)
Calcium: 8.5 mg/dL — ABNORMAL LOW (ref 8.9–10.3)
Chloride: 106 mmol/L (ref 98–111)
Creatinine, Ser: 4.89 mg/dL — ABNORMAL HIGH (ref 0.44–1.00)
GFR calc Af Amer: 8 mL/min — ABNORMAL LOW (ref >60)
GFR calc non Af Amer: 7 mL/min — ABNORMAL LOW (ref >60)
Glucose, Bld: 98 mg/dL (ref 70–99)
Potassium: 4.7 mmol/L (ref 3.5–5.1)
Sodium: 139 mmol/L (ref 135–145)

## 2020-03-23 LAB — CBC WITH DIFFERENTIAL/PLATELET
Abs Immature Granulocytes: 0.02 10*3/uL (ref 0.00–0.07)
Basophils Absolute: 0 10*3/uL (ref 0.0–0.1)
Basophils Relative: 1 %
Eosinophils Absolute: 0.3 10*3/uL (ref 0.0–0.5)
Eosinophils Relative: 5 %
HCT: 33.4 % — ABNORMAL LOW (ref 36.0–46.0)
Hemoglobin: 9.6 g/dL — ABNORMAL LOW (ref 12.0–15.0)
Immature Granulocytes: 0 %
Lymphocytes Relative: 28 %
Lymphs Abs: 1.4 10*3/uL (ref 0.7–4.0)
MCH: 25.7 pg — ABNORMAL LOW (ref 26.0–34.0)
MCHC: 28.7 g/dL — ABNORMAL LOW (ref 30.0–36.0)
MCV: 89.5 fL (ref 80.0–100.0)
Monocytes Absolute: 0.4 10*3/uL (ref 0.1–1.0)
Monocytes Relative: 8 %
Neutro Abs: 2.9 10*3/uL (ref 1.7–7.7)
Neutrophils Relative %: 58 %
Platelets: 152 10*3/uL (ref 150–400)
RBC: 3.73 MIL/uL — ABNORMAL LOW (ref 3.87–5.11)
RDW: 20.5 % — ABNORMAL HIGH (ref 11.5–15.5)
WBC: 5.1 10*3/uL (ref 4.0–10.5)
nRBC: 0.6 % — ABNORMAL HIGH (ref 0.0–0.2)

## 2020-03-23 MED ORDER — SENNOSIDES-DOCUSATE SODIUM 8.6-50 MG PO TABS: 2.0000 | ORAL_TABLET | Freq: Every evening | ORAL | Status: DC | PRN

## 2020-03-23 MED ORDER — ACETAMINOPHEN 325 MG PO TABS
650.0000 mg | ORAL_TABLET | Freq: Four times a day (QID) | ORAL | Status: DC | PRN
  Administered 2020-03-27: 650 mg via ORAL
  Filled 2020-03-23: qty 2

## 2020-03-23 MED ORDER — POLYETHYLENE GLYCOL 3350 17 G PO PACK
17.0000 g | PACK | Freq: Every day | ORAL | Status: DC | PRN
  Administered 2020-03-26: 17 g via ORAL
  Filled 2020-03-23: qty 1

## 2020-03-23 MED ORDER — TRAZODONE HCL 50 MG PO TABS
50.0000 mg | ORAL_TABLET | Freq: Every evening | ORAL | Status: DC | PRN
  Administered 2020-03-26: 50 mg via ORAL
  Filled 2020-03-23: qty 1

## 2020-03-23 NOTE — Progress Notes (Signed)
PROGRESS NOTE    [name]  UDJ:[id number] DOB: 1929/02/20 DOA: 03/22/2020 PCP: Antonietta Jewel, MD   Brief Narrative:  86 y.o. female with medical history significant of diastolic CHF, CKD stage IV, anemia of chronic disease, hypertension, hyperlipidemia, hypothyroidism, gout, history of DVT, morbid obesity presents to emergency department with worsening shortness of breath.  Elevated BNP, chest x-ray showed worsening pulmonary edema admitted for diastolic CHF exacerbation.   Assessment & Plan:   Principal Problem:   Acute respiratory failure with hypoxia (HCC) Active Problems:   Hyperkalemia   Metabolic acidosis   Renal failure (ARF), acute on chronic (HCC)   Hypertension   Sinus bradycardia   Acute on chronic diastolic heart failure (HCC)   HLD (hyperlipidemia)   Depression   Hypothyroidism  Acute hypoxic respiratory failure, 4 L nasal cannula Acute congestive heart failure with preserved ejection fraction, 65%, class III Persistent bilateral pleural effusions -Continue supplemental oxygen.  Lasix 80 mg IV twice daily -Echocardiogram 01/2020-EF 65 to 70%, grade 2 DD, elevated PA pressures, mild to moderate aortic valve calcification -Continue home meds-aspirin, metoprolol, Imdur, statin -Monitor renal function -Hopefully pleural effusions will improve some with diuretics.  Bilateral lower extremity pain and swelling History of DVT -Not on anticoagulation.  Suspect from fluid overload -Lower extremity Dopplers-negative  Acute on chronic CKD stage IV -Question for cardiorenal syndrome.  Baseline creatinine around 3.4.  Admission creatinine 4.9.  This morning 4.89 -Hopefully improved with diuresis.  No need for HD at this time -Renal ultrasound -Nephrology following -Hold allopurinol for now  Hyperkalemia, resolved -Admission potassium 5.6.  No EKG changes noted -Status post dose of Lokelma  Sinus bradycardia -Appears to be chronic.  Currently asymptomatic.  Will  start beta-blocker if necessary -TSH-slightly elevated at 6.4.  Essential hypertension -Norvasc, Imdur, metoprolol, hydralazine  Hyperlipidemia -Statin  Anemia of chronic disease, hemoglobin 9.6 -Hemoglobin stable, no obvious signs of bleeding.  Continue ferrous sulfate with bowel regimen  Hypothyroidism -Synthroid  Gout -Allopurinol on hold  Depression/anxiety -Reduce trazodone to 50 mg as needed, hydroxyzine as needed   DVT prophylaxis: Subcutaneous heparin Code Status: Full code Family Communication: Daughter at bedside  Status is: Inpatient  Remains inpatient appropriate because:Inpatient level of care appropriate due to severity of illness   Dispo: The patient is from: Home              Anticipated d/c is to: Home              Anticipated d/c date is: 3 days              Patient currently is not medically stable to d/c.  Maintain hospital stay due to acute hypoxia secondary to volume overload, bilateral pleural effusions.  She requires IV diuretics.    Body mass index is 40.02 kg/m.     Subjective: Still having some shortness of breath but improved compared to yesterday.  Review of Systems Otherwise negative except as per HPI, including: General: Denies fever, chills, night sweats or unintended weight loss. Resp: Denies hemoptysis Cardiac: Denies chest pain, palpitations, orthopnea, paroxysmal nocturnal dyspnea. GI: Denies abdominal pain, nausea, vomiting, diarrhea or constipation GU: Denies dysuria, frequency, hesitancy or incontinence MS: Denies muscle aches, joint pain or swelling Neuro: Denies headache, neurologic deficits (focal weakness, numbness, tingling), abnormal gait Psych: Denies anxiety, depression, SI/HI/AVH Skin: Denies new rashes or lesions ID: Denies sick contacts, exotic exposures, travel  Examination:  Constitutional: Not in acute distress, 3 L nasal cannula.  Elderly chronically ill-appearing Respiratory:  Bibasilar  rhonchi Cardiovascular: Normal sinus rhythm, no rubs Abdomen: Nontender nondistended good bowel sounds Musculoskeletal: 3+ bilateral lower extremity pitting edema Skin: No rashes seen Neurologic: CN 2-12 grossly intact.  And nonfocal Psychiatric: Poor judgment and insight bilateral alert awake oriented X3  Objective: Vitals:   03/22/20 1823 03/22/20 1940 03/23/20 0014 03/23/20 0400  BP: (!) 128/51 (!) 137/49 (!) 119/62 (!) 143/63  Pulse: 57 55 56 58  Resp: 18 18 15 16   Temp: 98.3 F (36.8 C) 98.2 F (36.8 C) 97.9 F (36.6 C) 98 F (36.7 C)  TempSrc: Oral Oral Oral Oral  SpO2: 98% 98% 97% 95%  Weight:      Height:        Intake/Output Summary (Last 24 hours) at 03/23/2020 0733 Last data filed at 03/23/2020 0640 Gross per 24 hour  Intake 290 ml  Output 300 ml  Net -10 ml   Filed Weights   03/22/20 1820  Weight: (!) 109.1 kg     Data Reviewed:   CBC: Recent Labs  Lab 03/22/20 1257 03/22/20 1653 03/23/20 0635  WBC 5.0 5.1 5.1  NEUTROABS 3.9  --  2.9  HGB 10.2* 10.0* 9.6*  HCT 36.4 36.3 33.4*  MCV 92.6 90.3 89.5  PLT 158 160 185   Basic Metabolic Panel: Recent Labs  Lab 03/22/20 1257 03/22/20 1653  NA 136 138  K 5.6* 5.5*  CL 106 105  CO2 17* 21*  GLUCOSE 131* 116*  BUN 62* 62*  CREATININE 4.90* 4.85*  CALCIUM 8.5* 8.6*  MG  --  2.7*   GFR: Estimated Creatinine Clearance: 9.7 mL/min (A) (by C-G formula based on SCr of 4.85 mg/dL (H)). Liver Function Tests: Recent Labs  Lab 03/22/20 1257  AST 19  ALT 12  ALKPHOS 66  BILITOT 0.5  PROT 6.0*  ALBUMIN 3.3*   No results for input(s): LIPASE, AMYLASE in the last 168 hours. No results for input(s): AMMONIA in the last 168 hours. Coagulation Profile: Recent Labs  Lab 03/22/20 1257  INR 1.2   Cardiac Enzymes: No results for input(s): CKTOTAL, CKMB, CKMBINDEX, TROPONINI in the last 168 hours. BNP (last 3 results) No results for input(s): PROBNP in the last 8760 hours. HbA1C: No results for  input(s): HGBA1C in the last 72 hours. CBG: No results for input(s): GLUCAP in the last 168 hours. Lipid Profile: No results for input(s): CHOL, HDL, LDLCALC, TRIG, CHOLHDL, LDLDIRECT in the last 72 hours. Thyroid Function Tests: Recent Labs    03/22/20 1653  TSH 6.402*   Anemia Panel: No results for input(s): VITAMINB12, FOLATE, FERRITIN, TIBC, IRON, RETICCTPCT in the last 72 hours. Sepsis Labs: No results for input(s): PROCALCITON, LATICACIDVEN in the last 168 hours.  Recent Results (from the past 240 hour(s))  SARS Coronavirus 2 by RT PCR (hospital order, performed in [facility] hospital lab) Nasopharyngeal Nasopharyngeal Swab     Status: None   Collection Time: 03/22/20 12:47 PM   Specimen: Nasopharyngeal Swab  Result Value Ref Range Status   SARS Coronavirus 2 NEGATIVE NEGATIVE Final    Comment: (NOTE) SARS-CoV-2 target nucleic acids are NOT DETECTED.  The SARS-CoV-2 RNA is generally detectable in upper and lower respiratory specimens during the acute phase of infection. The lowest concentration of SARS-CoV-2 viral copies this assay can detect is 250 copies / mL. A negative result does not preclude SARS-CoV-2 infection and should not be used as the sole basis for treatment or other patient management decisions.  A negative result may occur  with improper specimen collection / handling, submission of specimen other than nasopharyngeal swab, presence of viral mutation(s) within the areas targeted by this assay, and inadequate number of viral copies (<250 copies / mL). A negative result must be combined with clinical observations, patient history, and epidemiological information.  Fact Sheet for Patients:   StrictlyIdeas.no  Fact Sheet for Healthcare Providers: BankingDealers.co.za  This test is not yet approved or  cleared by the [place] FDA and has been authorized for detection and/or diagnosis of SARS-CoV-2 by FDA  under an Emergency Use Authorization (EUA).  This EUA will remain in effect (meaning this test can be used) for the duration of the COVID-19 declaration under Section 564(b)(1) of the Act, 21 U.S.C. section 360bbb-3(b)(1), unless the authorization is terminated or revoked sooner.  Performed at [facility], [place] [address] [place], [place] [address]          Radiology Studies: DG Chest Port 1 View  Result Date: 03/22/2020 CLINICAL DATA:  Shortness of breath, CHF EXAM: PORTABLE CHEST 1 VIEW COMPARISON:  02/21/2020 FINDINGS: Slight worsening pulmonary edema pattern throughout the mid and lower lungs with further obscuration of the cardiac borders. Pleural effusions noted bilaterally. Associated bibasilar atelectasis/partial collapse. No pneumothorax. Trachea midline. Aorta atherosclerotic. Degenerative changes of the spine. IMPRESSION: Worsening CHF pattern Electronically Signed   By: Jerilynn Mages.  Shick M.D.   On: 03/22/2020 13:10   VAS [place] LOWER EXTREMITY VENOUS (DVT)  Result Date: 03/22/2020  Lower Venous DVTStudy Indications: Swelling.  Risk Factors: None identified CHF. Limitations: Body habitus, poor ultrasound/tissue interface and patient pain tolerance. Comparison Study: No prior studies. Performing Technologist: Oliver Hum RVT  Examination Guidelines: A complete evaluation includes B-mode imaging, spectral Doppler, color Doppler, and power Doppler as needed of all accessible portions of each vessel. Bilateral testing is considered an integral part of a complete examination. Limited examinations for reoccurring indications may be performed as noted. The reflux portion of the exam is performed with the patient in reverse Trendelenburg.  +---------+---------------+---------+-----------+----------+--------------+ RIGHT    CompressibilityPhasicitySpontaneityPropertiesThrombus Aging +---------+---------------+---------+-----------+----------+--------------+ CFV      Full            Yes      Yes                                 +---------+---------------+---------+-----------+----------+--------------+ SFJ      Full                                                        +---------+---------------+---------+-----------+----------+--------------+ FV Prox  Full                                                        +---------+---------------+---------+-----------+----------+--------------+ FV Mid                                                Not visualized +---------+---------------+---------+-----------+----------+--------------+ FV Distal  Not visualized +---------+---------------+---------+-----------+----------+--------------+ PFV      Full                                                        +---------+---------------+---------+-----------+----------+--------------+ POP      Full           Yes      Yes                                 +---------+---------------+---------+-----------+----------+--------------+ PTV      Full                                                        +---------+---------------+---------+-----------+----------+--------------+ PERO                                                  Not visualized +---------+---------------+---------+-----------+----------+--------------+   +---------+---------------+---------+-----------+----------+--------------+ LEFT     CompressibilityPhasicitySpontaneityPropertiesThrombus Aging +---------+---------------+---------+-----------+----------+--------------+ CFV      Full           Yes      Yes                                 +---------+---------------+---------+-----------+----------+--------------+ SFJ      Full                                                        +---------+---------------+---------+-----------+----------+--------------+ FV Prox  Full                                                         +---------+---------------+---------+-----------+----------+--------------+ FV Mid                                                Not visualized +---------+---------------+---------+-----------+----------+--------------+ FV Distal                                             Not visualized +---------+---------------+---------+-----------+----------+--------------+ PFV                                                   Not visualized +---------+---------------+---------+-----------+----------+--------------+ POP      Full  Yes      Yes                                 +---------+---------------+---------+-----------+----------+--------------+ PTV      Full                                                        +---------+---------------+---------+-----------+----------+--------------+ PERO     Full                                                        +---------+---------------+---------+-----------+----------+--------------+     Summary: RIGHT: - There is no evidence of deep vein thrombosis in the lower extremity. However, portions of this examination were limited- see technologist comments above.  - No cystic structure found in the popliteal fossa.  LEFT: - There is no evidence of deep vein thrombosis in the lower extremity. However, portions of this examination were limited- see technologist comments above.  - No cystic structure found in the popliteal fossa.  *See table(s) above for measurements and observations.    Preliminary         Scheduled Meds: . allopurinol  100 mg Oral QHS  . amLODipine  10 mg Oral q AM  . aspirin EC  81 mg Oral Daily  . ferrous sulfate  325 mg Oral QHS  . furosemide  80 mg Intravenous Q12H  . heparin  5,000 Units Subcutaneous Q8H  . hydrALAZINE  125 mg Oral TID  . hydrOXYzine  50 mg Oral QHS  . isosorbide mononitrate  120 mg Oral Daily  . lactulose  10 g Oral Q2000  . levothyroxine  75 mcg Oral QAC breakfast  .  metoprolol succinate  12.5 mg Oral Daily  . rosuvastatin  10 mg Oral QPM  . sodium chloride flush  3 mL Intravenous Q12H   Continuous Infusions: . sodium chloride       LOS: 1 day   Time spent= 35 mins    Damon Hargrove Arsenio Loader, MD [facility]  If 7PM-7AM, please contact night-coverage  03/23/2020, 7:33 AM

## 2020-03-23 NOTE — Progress Notes (Signed)
[facility] Progress Note    Assessment/ Plan:   1. AKI on CKD5 (likely progressed to CKD5).  Largely fluctuant baseline renal function in the past, baseline creatinine have been around 2.5 or so but recently has been steadily climbing.  Her current etiology of AKI is best explained by decreased effective arterial blood flow in the setting of renovascular congestion -renal fntn stable, continue with diuresis -daily labs -check renal ultrasound -No indication for renal replacement therapy as of right now. If this is to be considered or needed, then would recommend goals of care discussion and palliative care discussions first.  Family and patient have agreed to the -Avoid nephrotoxic medications including NSAIDs and iodinated intravenous contrast exposure unless the latter is absolutely indicated.  Preferred narcotic agents for pain control are hydromorphone, fentanyl, and methadone. Morphine should not be used. Avoid Baclofen and avoid oral sodium phosphate and magnesium citrate based laxatives / bowel preps. Continue strict Input and Output monitoring. Will monitor the patient closely with you and intervene or adjust therapy as indicated by changes in clinical status/labs  2. Acute on chronic diastolic CHF -lasix 80mg  iv bid 3.  Metabolic acidosis likely secondary to #1, stable 4.  Hyperkalemia -improved with diuresis 5. Anemia of chronic kidney disease (normocytic) -iron panel 6.  CKD MBD -check phos and pth  Gean Quint, MD [facility] 03/23/2020, 1:49 PM   Subjective:   No acute events. Patient reports that she feels that her breathing is better, still has swelling which is unchanged.   Objective:   BP (!) 110/62 (BP Location: Left Arm)   Pulse 57   Temp 98 F (36.7 C) (Oral)   Resp 21   Ht 5\' 5"  (1.651 m)   Wt (!) 109.1 kg   SpO2 95%   BMI 40.02 kg/m   Intake/Output Summary (Last 24 hours) at 03/23/2020 1349 Last data filed at 03/23/2020  0900 Gross per 24 hour  Intake 530 ml  Output 300 ml  Net 230 ml   Weight change:   Physical Exam: Gen: No acute distress, laying in bed CVS: S1-S2, RRR, m/r/g Resp: Diminished air entry bibasilar, unable to speak in full sentences, on nasal cannula, bilateral chest expansion Abd: Soft, nontender/nondistended Ext: 2+ pitting edema bilateral lower extremities Neuro: Speech: Clear, AAO X3, no focal deficits  Imaging: DG Chest Port 1 View  Result Date: 03/22/2020 CLINICAL DATA:  Shortness of breath, CHF EXAM: PORTABLE CHEST 1 VIEW COMPARISON:  02/21/2020 FINDINGS: Slight worsening pulmonary edema pattern throughout the mid and lower lungs with further obscuration of the cardiac borders. Pleural effusions noted bilaterally. Associated bibasilar atelectasis/partial collapse. No pneumothorax. Trachea midline. Aorta atherosclerotic. Degenerative changes of the spine. IMPRESSION: Worsening CHF pattern Electronically Signed   By: Jerilynn Mages.  Shick M.D.   On: 03/22/2020 13:10   VAS [place] LOWER EXTREMITY VENOUS (DVT)  Result Date: 03/23/2020  Lower Venous DVTStudy Indications: Swelling.  Risk Factors: None identified CHF. Limitations: Body habitus, poor ultrasound/tissue interface and patient pain tolerance. Comparison Study: No prior studies. Performing Technologist: Oliver Hum RVT  Examination Guidelines: A complete evaluation includes B-mode imaging, spectral Doppler, color Doppler, and power Doppler as needed of all accessible portions of each vessel. Bilateral testing is considered an integral part of a complete examination. Limited examinations for reoccurring indications may be performed as noted. The reflux portion of the exam is performed with the patient in reverse Trendelenburg.  +---------+---------------+---------+-----------+----------+--------------+ RIGHT    CompressibilityPhasicitySpontaneityPropertiesThrombus Aging  +---------+---------------+---------+-----------+----------+--------------+ CFV  Full           Yes      Yes                                 +---------+---------------+---------+-----------+----------+--------------+ SFJ      Full                                                        +---------+---------------+---------+-----------+----------+--------------+ FV Prox  Full                                                        +---------+---------------+---------+-----------+----------+--------------+ FV Mid                                                Not visualized +---------+---------------+---------+-----------+----------+--------------+ FV Distal                                             Not visualized +---------+---------------+---------+-----------+----------+--------------+ PFV      Full                                                        +---------+---------------+---------+-----------+----------+--------------+ POP      Full           Yes      Yes                                 +---------+---------------+---------+-----------+----------+--------------+ PTV      Full                                                        +---------+---------------+---------+-----------+----------+--------------+ PERO                                                  Not visualized +---------+---------------+---------+-----------+----------+--------------+   +---------+---------------+---------+-----------+----------+--------------+ LEFT     CompressibilityPhasicitySpontaneityPropertiesThrombus Aging +---------+---------------+---------+-----------+----------+--------------+ CFV      Full           Yes      Yes                                 +---------+---------------+---------+-----------+----------+--------------+ SFJ      Full                                                         +---------+---------------+---------+-----------+----------+--------------+  FV Prox  Full                                                        +---------+---------------+---------+-----------+----------+--------------+ FV Mid                                                Not visualized +---------+---------------+---------+-----------+----------+--------------+ FV Distal                                             Not visualized +---------+---------------+---------+-----------+----------+--------------+ PFV                                                   Not visualized +---------+---------------+---------+-----------+----------+--------------+ POP      Full           Yes      Yes                                 +---------+---------------+---------+-----------+----------+--------------+ PTV      Full                                                        +---------+---------------+---------+-----------+----------+--------------+ PERO     Full                                                        +---------+---------------+---------+-----------+----------+--------------+     Summary: RIGHT: - There is no evidence of deep vein thrombosis in the lower extremity. However, portions of this examination were limited- see technologist comments above.  - No cystic structure found in the popliteal fossa.  LEFT: - There is no evidence of deep vein thrombosis in the lower extremity. However, portions of this examination were limited- see technologist comments above.  - No cystic structure found in the popliteal fossa.  *See table(s) above for measurements and observations. Electronically signed by Monica Martinez MD on 03/23/2020 at 11:37:26 AM.    Final     Labs: BMET Recent Labs  Lab 03/22/20 1257 03/22/20 1653 03/23/20 0635  NA 136 138 139  K 5.6* 5.5* 4.7  CL 106 105 106  CO2 17* 21* 22  GLUCOSE 131* 116* 98  BUN 62* 62* 57*  CREATININE 4.90* 4.85* 4.89*   CALCIUM 8.5* 8.6* 8.5*   CBC Recent Labs  Lab 03/22/20 1257 03/22/20 1653 03/23/20 0635  WBC 5.0 5.1 5.1  NEUTROABS 3.9  --  2.9  HGB 10.2* 10.0* 9.6*  HCT 36.4 36.3 33.4*  MCV 92.6 90.3 89.5  PLT 158 160 152  Medications:    . amLODipine  10 mg Oral q AM  . aspirin EC  81 mg Oral Daily  . ferrous sulfate  325 mg Oral QHS  . furosemide  80 mg Intravenous Q12H  . heparin  5,000 Units Subcutaneous Q8H  . hydrALAZINE  125 mg Oral TID  . hydrOXYzine  50 mg Oral QHS  . isosorbide mononitrate  120 mg Oral Daily  . lactulose  10 g Oral Q2000  . levothyroxine  75 mcg Oral QAC breakfast  . metoprolol succinate  12.5 mg Oral Daily  . rosuvastatin  10 mg Oral QPM  . sodium chloride flush  3 mL Intravenous Q12H

## 2020-03-24 DIAGNOSIS — J9601 Acute respiratory failure with hypoxia: Secondary | ICD-10-CM | POA: Diagnosis not present

## 2020-03-24 LAB — VITAMIN B12: Vitamin B-12: 1336 pg/mL — ABNORMAL HIGH (ref 180–914)

## 2020-03-24 LAB — RETICULOCYTES
Immature Retic Fract: 25.7 % — ABNORMAL HIGH (ref 2.3–15.9)
RBC.: 3.55 MIL/uL — ABNORMAL LOW (ref 3.87–5.11)
Retic Count, Absolute: 131.4 10*3/uL (ref 19.0–186.0)
Retic Ct Pct: 3.7 % — ABNORMAL HIGH (ref 0.4–3.1)

## 2020-03-24 LAB — CBC
HCT: 32.2 % — ABNORMAL LOW (ref 36.0–46.0)
Hemoglobin: 9.1 g/dL — ABNORMAL LOW (ref 12.0–15.0)
MCH: 25.4 pg — ABNORMAL LOW (ref 26.0–34.0)
MCHC: 28.3 g/dL — ABNORMAL LOW (ref 30.0–36.0)
MCV: 89.9 fL (ref 80.0–100.0)
Platelets: 145 10*3/uL — ABNORMAL LOW (ref 150–400)
RBC: 3.58 MIL/uL — ABNORMAL LOW (ref 3.87–5.11)
RDW: 20.2 % — ABNORMAL HIGH (ref 11.5–15.5)
WBC: 5.8 10*3/uL (ref 4.0–10.5)
nRBC: 0.5 % — ABNORMAL HIGH (ref 0.0–0.2)

## 2020-03-24 LAB — RENAL FUNCTION PANEL
Albumin: 2.8 g/dL — ABNORMAL LOW (ref 3.5–5.0)
Anion gap: 11 (ref 5–15)
BUN: 59 mg/dL — ABNORMAL HIGH (ref 8–23)
CO2: 23 mmol/L (ref 22–32)
Calcium: 8.2 mg/dL — ABNORMAL LOW (ref 8.9–10.3)
Chloride: 105 mmol/L (ref 98–111)
Creatinine, Ser: 4.88 mg/dL — ABNORMAL HIGH (ref 0.44–1.00)
GFR calc Af Amer: 9 mL/min — ABNORMAL LOW (ref >60)
GFR calc non Af Amer: 7 mL/min — ABNORMAL LOW (ref >60)
Glucose, Bld: 96 mg/dL (ref 70–99)
Phosphorus: 5.3 mg/dL — ABNORMAL HIGH (ref 2.5–4.6)
Potassium: 4.7 mmol/L (ref 3.5–5.1)
Sodium: 139 mmol/L (ref 135–145)

## 2020-03-24 LAB — FERRITIN: Ferritin: 324 ng/mL — ABNORMAL HIGH (ref 11–307)

## 2020-03-24 LAB — IRON AND TIBC
Iron: 20 ug/dL — ABNORMAL LOW (ref 28–170)
Saturation Ratios: 9 % — ABNORMAL LOW (ref 10.4–31.8)
TIBC: 216 ug/dL — ABNORMAL LOW (ref 250–450)
UIBC: 196 ug/dL

## 2020-03-24 LAB — MAGNESIUM: Magnesium: 2.5 mg/dL — ABNORMAL HIGH (ref 1.7–2.4)

## 2020-03-24 LAB — FOLATE: Folate: 26.6 ng/mL (ref >5.9)

## 2020-03-24 MED ORDER — CLONIDINE HCL 0.1 MG PO TABS
0.1000 mg | ORAL_TABLET | Freq: Three times a day (TID) | ORAL | Status: DC
  Administered 2020-03-24 – 2020-03-25 (×6): 0.1 mg via ORAL
  Filled 2020-03-24 (×6): qty 1

## 2020-03-24 MED ORDER — FUROSEMIDE 10 MG/ML IJ SOLN
120.0000 mg | Freq: Three times a day (TID) | INTRAVENOUS | Status: DC
  Administered 2020-03-24 – 2020-03-25 (×2): 120 mg via INTRAVENOUS
  Filled 2020-03-24: qty 10
  Filled 2020-03-24: qty 12
  Filled 2020-03-24: qty 10
  Filled 2020-03-24 (×2): qty 12

## 2020-03-24 NOTE — Progress Notes (Signed)
PROGRESS NOTE    [name]  BPZ:[id number] DOB: August 22, 1929 DOA: 03/22/2020 PCP: Antonietta Jewel, MD   Brief Narrative:  86 y.o. female with medical history significant of diastolic CHF, CKD stage IV, anemia of chronic disease, hypertension, hyperlipidemia, hypothyroidism, gout, history of DVT, morbid obesity presents to emergency department with worsening shortness of breath.  Elevated BNP, chest x-ray showed worsening pulmonary edema admitted for diastolic CHF exacerbation.  Nephrology was consulted due to worsening renal function.   Assessment & Plan:   Principal Problem:   Acute respiratory failure with hypoxia (HCC) Active Problems:   Hyperkalemia   Metabolic acidosis   Renal failure (ARF), acute on chronic (HCC)   Hypertension   Sinus bradycardia   Acute on chronic diastolic heart failure (HCC)   HLD (hyperlipidemia)   Depression   Hypothyroidism  Acute hypoxic respiratory failure,2 L nasal cannula Acute congestive heart failure with preserved ejection fraction, 65%, class IV Persistent bilateral pleural effusions -Slowly improving on Lasix 80 mg IV twice daily, will add nephrology adjust diuretics.  Recommend out of bed to chair. -Echocardiogram 01/2020-EF 65 to 70%, grade 2 DD, elevated PA pressures, mild to moderate aortic valve calcification -Continue home meds-aspirin, metoprolol, Imdur, statin -Monitor renal function  Bilateral lower extremity pain and swelling History of DVT -Not on anticoagulation.  Suspect from fluid overload -Lower extremity Dopplers-negative  Acute on chronic CKD stage IV -Suspicion for cardiorenal syndrome.  Baseline creatinine around 3.4.  Admission creatinine 4.9.  Creatinine today 4.88 -Very poor candidate for renal replacement therapy especially long-term. -Renal ultrasound-pending -Nephrology following -Hold allopurinol for now  Hyperkalemia, resolved -Improved with diuretics and a dose of Lokelma.  Sinus bradycardia -Appears to  be chronic.  Currently asymptomatic.  Will stop beta-blocker if necessary -TSH-slightly elevated at 6.4.  Essential hypertension, remains uncontrolled -Norvasc, Imdur, metoprolol, hydralazine.  We will add clonidine.  I also hope this will improve with diuresis  Hyperlipidemia -Statin  Anemia of chronic disease, hemoglobin 9.6 -Hemoglobin stable, no obvious signs of bleeding.  Continue ferrous sulfate with bowel regimen  Hypothyroidism -Synthroid  Gout -Allopurinol on hold  Depression/anxiety -Reduce trazodone to 50 mg as needed, hydroxyzine as needed   DVT prophylaxis: Subcutaneous heparin Code Status: Full code Family Communication: Daughter at bedside  Status is: Inpatient  Remains inpatient appropriate because:Inpatient level of care appropriate due to severity of illness   Dispo: The patient is from: Home              Anticipated d/c is to: Home              Anticipated d/c date is: 3 days              Patient currently is not medically stable to d/c.  Maintain hospital stay due to acute hypoxia secondary to volume overload, bilateral pleural effusions.  She requires IV diuretics.    Body mass index is 39.8 kg/m.     Subjective: Patient states she feels lousy this morning and still has dyspnea at rest Review of Systems Otherwise negative except as per HPI, including: General: Denies fever, chills, night sweats or unintended weight loss. Resp: Denies hemoptysis Cardiac: Denies chest pain, palpitations, orthopnea, paroxysmal nocturnal dyspnea. GI: Denies abdominal pain, nausea, vomiting, diarrhea or constipation GU: Denies dysuria, frequency, hesitancy or incontinence MS: Denies muscle aches, joint pain or swelling Neuro: Denies headache, neurologic deficits (focal weakness, numbness, tingling), abnormal gait Psych: Denies anxiety, depression, SI/HI/AVH Skin: Denies new rashes or lesions ID: Denies sick contacts,  exotic exposures,  travel  Examination:  Constitutional: Elderly frail, on 4 L nasal cannula Respiratory: Bilateral diminished breath sounds especially at the bases. Cardiovascular: Normal sinus rhythm, no rubs Abdomen: Nontender nondistended good bowel sounds Musculoskeletal: 2+ bilateral lower extremity pitting edema Skin: No rashes seen Neurologic: CN 2-12 grossly intact.  And nonfocal Psychiatric: Alert awake oriented X3  Objective: Vitals:   03/23/20 1714 03/23/20 1939 03/24/20 0006 03/24/20 0346  BP:  (!) 129/41 (!) 161/39 (!) 173/40  Pulse:  55 58 58  Resp:  18 17 14   Temp: 97.9 F (36.6 C) 98.1 F (36.7 C) 97.9 F (36.6 C) 98.4 F (36.9 C)  TempSrc: Oral Oral Oral Oral  SpO2:  94% 94% 94%  Weight:    (!) 108.5 kg  Height:        Intake/Output Summary (Last 24 hours) at 03/24/2020 0754 Last data filed at 03/24/2020 0349 Gross per 24 hour  Intake 840 ml  Output 950 ml  Net -110 ml   Filed Weights   03/22/20 1820 03/24/20 0346  Weight: (!) 109.1 kg (!) 108.5 kg     Data Reviewed:   CBC: Recent Labs  Lab 03/22/20 1257 03/22/20 1653 03/23/20 0635 03/24/20 0456  WBC 5.0 5.1 5.1 5.8  NEUTROABS 3.9  --  2.9  --   HGB 10.2* 10.0* 9.6* 9.1*  HCT 36.4 36.3 33.4* 32.2*  MCV 92.6 90.3 89.5 89.9  PLT 158 160 152 585*   Basic Metabolic Panel: Recent Labs  Lab 03/22/20 1257 03/22/20 1653 03/23/20 0635 03/24/20 0456  NA 136 138 139 139  K 5.6* 5.5* 4.7 4.7  CL 106 105 106 105  CO2 17* 21* 22 23  GLUCOSE 131* 116* 98 96  BUN 62* 62* 57* 59*  CREATININE 4.90* 4.85* 4.89* 4.88*  CALCIUM 8.5* 8.6* 8.5* 8.2*  MG  --  2.7*  --  2.5*  PHOS  --   --   --  5.3*   GFR: Estimated Creatinine Clearance: 9.6 mL/min (A) (by C-G formula based on SCr of 4.88 mg/dL (H)). Liver Function Tests: Recent Labs  Lab 03/22/20 1257 03/24/20 0456  AST 19  --   ALT 12  --   ALKPHOS 66  --   BILITOT 0.5  --   PROT 6.0*  --   ALBUMIN 3.3* 2.8*   No results for input(s): LIPASE, AMYLASE  in the last 168 hours. No results for input(s): AMMONIA in the last 168 hours. Coagulation Profile: Recent Labs  Lab 03/22/20 1257  INR 1.2   Cardiac Enzymes: No results for input(s): CKTOTAL, CKMB, CKMBINDEX, TROPONINI in the last 168 hours. BNP (last 3 results) No results for input(s): PROBNP in the last 8760 hours. HbA1C: No results for input(s): HGBA1C in the last 72 hours. CBG: No results for input(s): GLUCAP in the last 168 hours. Lipid Profile: No results for input(s): CHOL, HDL, LDLCALC, TRIG, CHOLHDL, LDLDIRECT in the last 72 hours. Thyroid Function Tests: Recent Labs    03/22/20 1653  TSH 6.402*   Anemia Panel: Recent Labs    03/24/20 0456  VITAMINB12 1,336*  FERRITIN 324*  TIBC 216*  IRON 20*  RETICCTPCT 3.7*   Sepsis Labs: No results for input(s): PROCALCITON, LATICACIDVEN in the last 168 hours.  Recent Results (from the past 240 hour(s))  SARS Coronavirus 2 by RT PCR (hospital order, performed in [facility] hospital lab) Nasopharyngeal Nasopharyngeal Swab     Status: None   Collection Time: 03/22/20 12:47 PM  Specimen: Nasopharyngeal Swab  Result Value Ref Range Status   SARS Coronavirus 2 NEGATIVE NEGATIVE Final    Comment: (NOTE) SARS-CoV-2 target nucleic acids are NOT DETECTED.  The SARS-CoV-2 RNA is generally detectable in upper and lower respiratory specimens during the acute phase of infection. The lowest concentration of SARS-CoV-2 viral copies this assay can detect is 250 copies / mL. A negative result does not preclude SARS-CoV-2 infection and should not be used as the sole basis for treatment or other patient management decisions.  A negative result may occur with improper specimen collection / handling, submission of specimen other than nasopharyngeal swab, presence of viral mutation(s) within the areas targeted by this assay, and inadequate number of viral copies (<250 copies / mL). A negative result must be combined with  clinical observations, patient history, and epidemiological information.  Fact Sheet for Patients:   StrictlyIdeas.no  Fact Sheet for Healthcare Providers: BankingDealers.co.za  This test is not yet approved or  cleared by the [place] FDA and has been authorized for detection and/or diagnosis of SARS-CoV-2 by FDA under an Emergency Use Authorization (EUA).  This EUA will remain in effect (meaning this test can be used) for the duration of the COVID-19 declaration under Section 564(b)(1) of the Act, 21 U.S.C. section 360bbb-3(b)(1), unless the authorization is terminated or revoked sooner.  Performed at [facility], [place] [address], [place], [place] [address]          Radiology Studies: DG Chest Port 1 View  Result Date: 03/22/2020 CLINICAL DATA:  Shortness of breath, CHF EXAM: PORTABLE CHEST 1 VIEW COMPARISON:  02/21/2020 FINDINGS: Slight worsening pulmonary edema pattern throughout the mid and lower lungs with further obscuration of the cardiac borders. Pleural effusions noted bilaterally. Associated bibasilar atelectasis/partial collapse. No pneumothorax. Trachea midline. Aorta atherosclerotic. Degenerative changes of the spine. IMPRESSION: Worsening CHF pattern Electronically Signed   By: Jerilynn Mages.  Shick M.D.   On: 03/22/2020 13:10   VAS [place] LOWER EXTREMITY VENOUS (DVT)  Result Date: 03/23/2020  Lower Venous DVTStudy Indications: Swelling.  Risk Factors: None identified CHF. Limitations: Body habitus, poor ultrasound/tissue interface and patient pain tolerance. Comparison Study: No prior studies. Performing Technologist: Oliver Hum RVT  Examination Guidelines: A complete evaluation includes B-mode imaging, spectral Doppler, color Doppler, and power Doppler as needed of all accessible portions of each vessel. Bilateral testing is considered an integral part of a complete examination. Limited examinations for reoccurring  indications may be performed as noted. The reflux portion of the exam is performed with the patient in reverse Trendelenburg.  +---------+---------------+---------+-----------+----------+--------------+ RIGHT    CompressibilityPhasicitySpontaneityPropertiesThrombus Aging +---------+---------------+---------+-----------+----------+--------------+ CFV      Full           Yes      Yes                                 +---------+---------------+---------+-----------+----------+--------------+ SFJ      Full                                                        +---------+---------------+---------+-----------+----------+--------------+ FV Prox  Full                                                        +---------+---------------+---------+-----------+----------+--------------+  FV Mid                                                Not visualized +---------+---------------+---------+-----------+----------+--------------+ FV Distal                                             Not visualized +---------+---------------+---------+-----------+----------+--------------+ PFV      Full                                                        +---------+---------------+---------+-----------+----------+--------------+ POP      Full           Yes      Yes                                 +---------+---------------+---------+-----------+----------+--------------+ PTV      Full                                                        +---------+---------------+---------+-----------+----------+--------------+ PERO                                                  Not visualized +---------+---------------+---------+-----------+----------+--------------+   +---------+---------------+---------+-----------+----------+--------------+ LEFT     CompressibilityPhasicitySpontaneityPropertiesThrombus Aging  +---------+---------------+---------+-----------+----------+--------------+ CFV      Full           Yes      Yes                                 +---------+---------------+---------+-----------+----------+--------------+ SFJ      Full                                                        +---------+---------------+---------+-----------+----------+--------------+ FV Prox  Full                                                        +---------+---------------+---------+-----------+----------+--------------+ FV Mid                                                Not visualized +---------+---------------+---------+-----------+----------+--------------+ FV Distal  Not visualized +---------+---------------+---------+-----------+----------+--------------+ PFV                                                   Not visualized +---------+---------------+---------+-----------+----------+--------------+ POP      Full           Yes      Yes                                 +---------+---------------+---------+-----------+----------+--------------+ PTV      Full                                                        +---------+---------------+---------+-----------+----------+--------------+ PERO     Full                                                        +---------+---------------+---------+-----------+----------+--------------+     Summary: RIGHT: - There is no evidence of deep vein thrombosis in the lower extremity. However, portions of this examination were limited- see technologist comments above.  - No cystic structure found in the popliteal fossa.  LEFT: - There is no evidence of deep vein thrombosis in the lower extremity. However, portions of this examination were limited- see technologist comments above.  - No cystic structure found in the popliteal fossa.  *See table(s) above for measurements and observations.  Electronically signed by Monica Martinez MD on 03/23/2020 at 11:37:26 AM.    Final         Scheduled Meds: . amLODipine  10 mg Oral q AM  . aspirin EC  81 mg Oral Daily  . ferrous sulfate  325 mg Oral QHS  . furosemide  80 mg Intravenous Q12H  . heparin  5,000 Units Subcutaneous Q8H  . hydrALAZINE  125 mg Oral TID  . hydrOXYzine  50 mg Oral QHS  . isosorbide mononitrate  120 mg Oral Daily  . lactulose  10 g Oral Q2000  . levothyroxine  75 mcg Oral QAC breakfast  . metoprolol succinate  12.5 mg Oral Daily  . rosuvastatin  10 mg Oral QPM  . sodium chloride flush  3 mL Intravenous Q12H   Continuous Infusions: . sodium chloride       LOS: 2 days   Time spent= 35 mins    Jonise Weightman Arsenio Loader, MD [facility]  If 7PM-7AM, please contact night-coverage  03/24/2020, 7:54 AM

## 2020-03-24 NOTE — Progress Notes (Signed)
Admit: 03/22/2020 LOS: 2  60F AoCKD5 with AoC HFpEF  Subjective:  . Not adequately diuresing . Weights unchagned . SCr stable, not worse . Famiy at bedside   07/25 0701 - 07/26 0700 In: 840 [P.O.:840] Out: 950 [Urine:950]  Filed Weights   03/22/20 1820 03/24/20 0346  Weight: (!) 109.1 kg (!) 108.5 kg    Scheduled Meds: . amLODipine  10 mg Oral q AM  . aspirin EC  81 mg Oral Daily  . cloNIDine  0.1 mg Oral TID  . ferrous sulfate  325 mg Oral QHS  . heparin  5,000 Units Subcutaneous Q8H  . hydrALAZINE  125 mg Oral TID  . hydrOXYzine  50 mg Oral QHS  . isosorbide mononitrate  120 mg Oral Daily  . lactulose  10 g Oral Q2000  . levothyroxine  75 mcg Oral QAC breakfast  . metoprolol succinate  12.5 mg Oral Daily  . rosuvastatin  10 mg Oral QPM  . sodium chloride flush  3 mL Intravenous Q12H   Continuous Infusions: . sodium chloride    . furosemide     PRN Meds:.sodium chloride, acetaminophen, docusate sodium, HYDROcodone-acetaminophen, ondansetron (ZOFRAN) IV, polyethylene glycol, senna-docusate, sodium chloride flush, traZODone  Current Labs: reviewed    Physical Exam:  Blood pressure (!) 175/42, pulse 60, temperature 98.9 F (37.2 C), temperature source Oral, resp. rate 14, height 5\' 5"  (1.651 m), weight (!) 108.5 kg, SpO2 94 %. Elderly female NAD On [place], NCAT RRR ctab no rub 3+ LEE, +sacral edema Crackles in bases No rashes/lesions AAO x3  A 1. AoCKD5 probably 2/2 #2 2. HFpEF with acute exacerbation 3. Hyperkalemia, resolved 4. HTN, BP elevated, hypervolemic 5. Anemia, Hb in 9s  P . Inc lasixto 120 TID . No RRT needs, cont to dialogue with family . Medication Issues; o Preferred narcotic agents for pain control are hydromorphone, fentanyl, and methadone. Morphine should not be used.  o Baclofen should be avoided o Avoid oral sodium phosphate and magnesium citrate based laxatives / bowel preps    Pearson Grippe MD 03/24/2020, 3:56 PM  Recent Labs  Lab  03/22/20 1653 03/23/20 0635 03/24/20 0456  NA 138 139 139  K 5.5* 4.7 4.7  CL 105 106 105  CO2 21* 22 23  GLUCOSE 116* 98 96  BUN 62* 57* 59*  CREATININE 4.85* 4.89* 4.88*  CALCIUM 8.6* 8.5* 8.2*  PHOS  --   --  5.3*   Recent Labs  Lab 03/22/20 1257 03/22/20 1257 03/22/20 1653 03/23/20 0635 03/24/20 0456  WBC 5.0   < > 5.1 5.1 5.8  NEUTROABS 3.9  --   --  2.9  --   HGB 10.2*   < > 10.0* 9.6* 9.1*  HCT 36.4   < > 36.3 33.4* 32.2*  MCV 92.6   < > 90.3 89.5 89.9  PLT 158   < > 160 152 145*   < > = values in this interval not displayed.

## 2020-03-25 ENCOUNTER — Inpatient Hospital Stay (HOSPITAL_COMMUNITY): Payer: Medicare Other

## 2020-03-25 DIAGNOSIS — J9601 Acute respiratory failure with hypoxia: Secondary | ICD-10-CM | POA: Diagnosis not present

## 2020-03-25 LAB — CBC
HCT: 31.8 % — ABNORMAL LOW (ref 36.0–46.0)
Hemoglobin: 8.9 g/dL — ABNORMAL LOW (ref 12.0–15.0)
MCH: 25.4 pg — ABNORMAL LOW (ref 26.0–34.0)
MCHC: 28 g/dL — ABNORMAL LOW (ref 30.0–36.0)
MCV: 90.9 fL (ref 80.0–100.0)
Platelets: 131 10*3/uL — ABNORMAL LOW (ref 150–400)
RBC: 3.5 MIL/uL — ABNORMAL LOW (ref 3.87–5.11)
RDW: 20.1 % — ABNORMAL HIGH (ref 11.5–15.5)
WBC: 4.3 10*3/uL (ref 4.0–10.5)
nRBC: 0.7 % — ABNORMAL HIGH (ref 0.0–0.2)

## 2020-03-25 LAB — BASIC METABOLIC PANEL
Anion gap: 8 (ref 5–15)
BUN: 62 mg/dL — ABNORMAL HIGH (ref 8–23)
CO2: 23 mmol/L (ref 22–32)
Calcium: 8 mg/dL — ABNORMAL LOW (ref 8.9–10.3)
Chloride: 104 mmol/L (ref 98–111)
Creatinine, Ser: 4.83 mg/dL — ABNORMAL HIGH (ref 0.44–1.00)
GFR calc Af Amer: 9 mL/min — ABNORMAL LOW (ref >60)
GFR calc non Af Amer: 7 mL/min — ABNORMAL LOW (ref >60)
Glucose, Bld: 99 mg/dL (ref 70–99)
Potassium: 4.7 mmol/L (ref 3.5–5.1)
Sodium: 135 mmol/L (ref 135–145)

## 2020-03-25 LAB — PTH, INTACT AND CALCIUM
Calcium, Total (PTH): 8.1 mg/dL — ABNORMAL LOW (ref 8.7–10.3)
PTH: 83 pg/mL — ABNORMAL HIGH (ref 15–65)

## 2020-03-25 LAB — BRAIN NATRIURETIC PEPTIDE: B Natriuretic Peptide: 2186 pg/mL — ABNORMAL HIGH (ref 0.0–100.0)

## 2020-03-25 LAB — MAGNESIUM: Magnesium: 2.5 mg/dL — ABNORMAL HIGH (ref 1.7–2.4)

## 2020-03-25 MED ORDER — ALBUMIN HUMAN 25 % IV SOLN
25.0000 g | Freq: Four times a day (QID) | INTRAVENOUS | Status: AC
  Administered 2020-03-25: 12.5 g via INTRAVENOUS
  Administered 2020-03-25 (×2): 25 g via INTRAVENOUS
  Administered 2020-03-25: 12.5 g via INTRAVENOUS
  Filled 2020-03-25 (×3): qty 100

## 2020-03-25 MED ORDER — FUROSEMIDE 10 MG/ML IJ SOLN
160.0000 mg | Freq: Three times a day (TID) | INTRAVENOUS | Status: DC
  Administered 2020-03-25 – 2020-03-27 (×6): 160 mg via INTRAVENOUS
  Filled 2020-03-25 (×8): qty 16

## 2020-03-25 NOTE — Progress Notes (Signed)
PROGRESS NOTE    [name]  KZS:[id number] DOB: 11-24-1928 DOA: 03/22/2020 PCP: Antonietta Jewel, MD   Brief Narrative:  86 y.o. female with medical history significant of diastolic CHF, CKD stage IV, anemia of chronic disease, hypertension, hyperlipidemia, hypothyroidism, gout, history of DVT, morbid obesity presents to emergency department with worsening shortness of breath.  Elevated BNP, chest x-ray showed worsening pulmonary edema admitted for diastolic CHF exacerbation.  Nephrology was consulted due to worsening renal function.  Currently patient being diuresed, diuretics being increased.   Assessment & Plan:   Principal Problem:   Acute respiratory failure with hypoxia (HCC) Active Problems:   Hyperkalemia   Metabolic acidosis   Renal failure (ARF), acute on chronic (HCC)   Hypertension   Sinus bradycardia   Acute on chronic diastolic heart failure (HCC)   HLD (hyperlipidemia)   Depression   Hypothyroidism  Acute hypoxic respiratory failure,2 L nasal cannula Acute congestive heart failure with preserved ejection fraction, 65%, class III Persistent bilateral pleural effusions -Lasix increased 260 mg every 8 hours.  Albumin is not terribly low but I will add albumin for 3 doses to see if this can facilitate diuresis otherwise we can try diuresing with Bumex. -Echocardiogram 01/2020-EF 65 to 70%, grade 2 DD, elevated PA pressures, mild to moderate aortic valve calcification -Continue home meds-aspirin, metoprolol, Imdur, statin -Monitor renal function -Out of bed to chair  Acute on chronic CKD stage IV -Suspicion for cardiorenal syndrome.  Baseline creatinine around 3.4.  Admission creatinine 4.9.  Creatinine today 4.88 -Very poor candidate for renal replacement therapy especially long-term. -Nephrology following -Hold allopurinol for now  Bilateral lower extremity pain and swelling History of DVT -Not on anticoagulation.  Suspect from fluid overload -Lower extremity  Dopplers-negative  Hyperkalemia, resolved -Resolved with diuretics and Lokelma  Sinus bradycardia -Appears to be chronic.  Currently asymptomatic.  Will stop beta-blocker if necessary -TSH-slightly elevated at 6.4.  Essential hypertension, improved -Norvasc, Imdur, metoprolol, hydralazine.  Clonidine added this admission.  Hyperlipidemia -Statin  Anemia of chronic disease, hemoglobin 8.9 -Hemoglobin stable, no obvious signs of bleeding.  Continue ferrous sulfate with bowel regimen  Hypothyroidism -Synthroid  Gout -Allopurinol on hold  Depression/anxiety -Reduce trazodone to 50 mg as needed, hydroxyzine as needed  PT/OT added  Had extensive goals of care conversation with the patient, daughter and son at bedside.  Patient agrees to change her CODE STATUS to DNR/DNI.  She also states she would not want dialysis if her renal function worsens.   DVT prophylaxis: Subcutaneous heparin Code Status: Full code Family Communication: Son and daughter at bedside  Status is: Inpatient  Remains inpatient appropriate because:Inpatient level of care appropriate due to severity of illness   Dispo: The patient is from: Home              Anticipated d/c is to: Home              Anticipated d/c date is: 3 days              Patient currently is not medically stable to d/c.  Maintain hospital stay due to acute hypoxia secondary to volume overload, bilateral pleural effusions.  She requires IV diuretics.  Body mass index is 40.28 kg/m.  Subjective: Seen and examined at bedside, she is still dyspneic slightly at rest.  I had extensive conversation with the patient's daughter, her son and the patient herself this morning regarding goals of care.  Patient is very clear she wants to be DNR/DNI and  would not want dialysis in case if her renal function worsens.  She is okay with medical management at this time.  Review of Systems Otherwise negative except as per HPI, including: General:  Denies fever, chills, night sweats or unintended weight loss. Resp: Denies hemoptysis Cardiac: Denies chest pain, palpitations, orthopnea, paroxysmal nocturnal dyspnea. GI: Denies abdominal pain, nausea, vomiting, diarrhea or constipation GU: Denies dysuria, frequency, hesitancy or incontinence MS: Denies muscle aches, joint pain or swelling Neuro: Denies headache, neurologic deficits (focal weakness, numbness, tingling), abnormal gait Psych: Denies anxiety, depression, SI/HI/AVH Skin: Denies new rashes or lesions ID: Denies sick contacts, exotic exposures, travel  Examination:  Constitutional: Elderly frail, 3-4 L nasal cannula Respiratory: Bilateral diminished breath sounds midway up the lung fields Cardiovascular: Normal sinus rhythm, no rubs Abdomen: Nontender nondistended good bowel sounds Musculoskeletal: 2+ bilateral lower extremity pitting edema Skin: No rashes seen Neurologic: CN 2-12 grossly intact.  And nonfocal Psychiatric: Normal judgment and insight. Alert and oriented x 3. Normal mood. Objective: Vitals:   03/25/20 0022 03/25/20 0346 03/25/20 0356 03/25/20 0742  BP: (!) 137/40  (!) 134/58   Pulse: 47  50   Resp: 20  16   Temp: 98.2 F (36.8 C)  98.5 F (36.9 C) 98.3 F (36.8 C)  TempSrc: Oral  Oral Oral  SpO2: 99%  99%   Weight: (!) 109.8 kg (!) 109.8 kg    Height:        Intake/Output Summary (Last 24 hours) at 03/25/2020 0750 Last data filed at 03/25/2020 0500 Gross per 24 hour  Intake 842 ml  Output 400 ml  Net 442 ml   Filed Weights   03/24/20 0346 03/25/20 0022 03/25/20 0346  Weight: (!) 108.5 kg (!) 109.8 kg (!) 109.8 kg     Data Reviewed:   CBC: Recent Labs  Lab 03/22/20 1257 03/22/20 1653 03/23/20 0635 03/24/20 0456 03/25/20 0425  WBC 5.0 5.1 5.1 5.8 4.3  NEUTROABS 3.9  --  2.9  --   --   HGB 10.2* 10.0* 9.6* 9.1* 8.9*  HCT 36.4 36.3 33.4* 32.2* 31.8*  MCV 92.6 90.3 89.5 89.9 90.9  PLT 158 160 152 145* 983*   Basic Metabolic  Panel: Recent Labs  Lab 03/22/20 1257 03/22/20 1257 03/22/20 1653 03/23/20 0635 03/24/20 0456 03/25/20 0425  NA 136  --  138 139 139 135  K 5.6*  --  5.5* 4.7 4.7 4.7  CL 106  --  105 106 105 104  CO2 17*  --  21* 22 23 23   GLUCOSE 131*  --  116* 98 96 99  BUN 62*  --  62* 57* 59* 62*  CREATININE 4.90*  --  4.85* 4.89* 4.88* 4.83*  CALCIUM 8.5*   < > 8.6* 8.5* 8.2*  8.1* 8.0*  MG  --   --  2.7*  --  2.5* 2.5*  PHOS  --   --   --   --  5.3*  --    < > = values in this interval not displayed.   GFR: Estimated Creatinine Clearance: 9.7 mL/min (A) (by C-G formula based on SCr of 4.83 mg/dL (H)). Liver Function Tests: Recent Labs  Lab 03/22/20 1257 03/24/20 0456  AST 19  --   ALT 12  --   ALKPHOS 66  --   BILITOT 0.5  --   PROT 6.0*  --   ALBUMIN 3.3* 2.8*   No results for input(s): LIPASE, AMYLASE in the last 168 hours. No results  for input(s): AMMONIA in the last 168 hours. Coagulation Profile: Recent Labs  Lab 03/22/20 1257  INR 1.2   Cardiac Enzymes: No results for input(s): CKTOTAL, CKMB, CKMBINDEX, TROPONINI in the last 168 hours. BNP (last 3 results) No results for input(s): PROBNP in the last 8760 hours. HbA1C: No results for input(s): HGBA1C in the last 72 hours. CBG: No results for input(s): GLUCAP in the last 168 hours. Lipid Profile: No results for input(s): CHOL, HDL, LDLCALC, TRIG, CHOLHDL, LDLDIRECT in the last 72 hours. Thyroid Function Tests: Recent Labs    03/22/20 1653  TSH 6.402*   Anemia Panel: Recent Labs    03/24/20 0456  VITAMINB12 1,336*  FOLATE 26.6  FERRITIN 324*  TIBC 216*  IRON 20*  RETICCTPCT 3.7*   Sepsis Labs: No results for input(s): PROCALCITON, LATICACIDVEN in the last 168 hours.  Recent Results (from the past 240 hour(s))  SARS Coronavirus 2 by RT PCR (hospital order, performed in [facility] hospital lab) Nasopharyngeal Nasopharyngeal Swab     Status: None   Collection Time: 03/22/20 12:47 PM   Specimen:  Nasopharyngeal Swab  Result Value Ref Range Status   SARS Coronavirus 2 NEGATIVE NEGATIVE Final    Comment: (NOTE) SARS-CoV-2 target nucleic acids are NOT DETECTED.  The SARS-CoV-2 RNA is generally detectable in upper and lower respiratory specimens during the acute phase of infection. The lowest concentration of SARS-CoV-2 viral copies this assay can detect is 250 copies / mL. A negative result does not preclude SARS-CoV-2 infection and should not be used as the sole basis for treatment or other patient management decisions.  A negative result may occur with improper specimen collection / handling, submission of specimen other than nasopharyngeal swab, presence of viral mutation(s) within the areas targeted by this assay, and inadequate number of viral copies (<250 copies / mL). A negative result must be combined with clinical observations, patient history, and epidemiological information.  Fact Sheet for Patients:   StrictlyIdeas.no  Fact Sheet for Healthcare Providers: BankingDealers.co.za  This test is not yet approved or  cleared by the [place] FDA and has been authorized for detection and/or diagnosis of SARS-CoV-2 by FDA under an Emergency Use Authorization (EUA).  This EUA will remain in effect (meaning this test can be used) for the duration of the COVID-19 declaration under Section 564(b)(1) of the Act, 21 U.S.C. section 360bbb-3(b)(1), unless the authorization is terminated or revoked sooner.  Performed at [facility], [place] [address] [place], [place] [address]          Radiology Studies: DG Chest 2 View  Result Date: 03/25/2020 CLINICAL DATA:  Pleural effusion EXAM: CHEST - 2 VIEW COMPARISON:  March 22, 2020 FINDINGS: There is cardiomegaly with pulmonary venous hypertension. Small pleural effusions noted. There is consolidation in the left lower lung region. There is mild interstitial edema. No  adenopathy. There is aortic atherosclerosis. There is degenerative change in the thoracic spine. IMPRESSION: Cardiomegaly with pulmonary vascular congestion. There is a degree of interstitial edema and bilateral pleural effusions consistent with congestive heart failure. Question alveolar edema versus pneumonia left lower lobe region. Both edema and pneumonia may be present concurrently. Aortic Atherosclerosis (ICD10-I70.0). Electronically Signed   By: Lowella Grip III M.D.   On: 03/25/2020 07:23        Scheduled Meds: . amLODipine  10 mg Oral q AM  . aspirin EC  81 mg Oral Daily  . cloNIDine  0.1 mg Oral TID  . ferrous sulfate  325 mg Oral QHS  . heparin  5,000 Units Subcutaneous Q8H  . hydrALAZINE  125 mg Oral TID  . hydrOXYzine  50 mg Oral QHS  . isosorbide mononitrate  120 mg Oral Daily  . lactulose  10 g Oral Q2000  . levothyroxine  75 mcg Oral QAC breakfast  . metoprolol succinate  12.5 mg Oral Daily  . rosuvastatin  10 mg Oral QPM  . sodium chloride flush  3 mL Intravenous Q12H   Continuous Infusions: . sodium chloride    . furosemide 120 mg (03/25/20 0652)     LOS: 3 days   Time spent= 35 mins    Alandra Sando Arsenio Loader, MD [facility]  If 7PM-7AM, please contact night-coverage  03/25/2020, 7:50 AM

## 2020-03-25 NOTE — Evaluation (Signed)
Occupational Therapy Evaluation Patient Details Name: [name] MRN: [id number] DOB: 1928/11/21 Today's Date: 03/25/2020    History of Present Illness Patient is a 86 y/o female who presents with SOB, LE swelling and pain. Admitted with acute hypoxemic respiratory failure secondary to acute on chronic diastolic CHF. PMH includes CHF, CKD, PVD, DVT, bil TKA,   Clinical Impression   PTA patient as living with family and had a PCA MWF who provided assistance for BADLs/IADLs including bathing/dressing and homemaking tasks. Meals provided by family and from meal on wheels a few times weekly. Per patient report, she had just began working with [facility] therapies. Patient currently presents with deficits in activity tolerance, standing balance, and requiring supplemental O2 via Echo. Patient also limited by increased need for assistance with BADLs 2/2 to deficits above. Patient would benefit from continued acute OT services in prep for safe d/c to next level of care with recommendation for short-term SNF rehab to maximize safety and independence with self-care tasks. When decision is made on d/c disposition, OT to update recommendations for DME per patients needs if to d/c home with family and HH therapies.     Follow Up Recommendations  SNF;Home health OT;Supervision/Assistance - 24 hour    Equipment Recommendations  None recommended by OT;Other (comment) (Will continue to assess if patient is to return home. )    Recommendations for Other Services       Precautions / Restrictions Precautions Precautions: Fall Precaution Comments: watch 02 Restrictions Weight Bearing Restrictions: No      Mobility Bed Mobility Overal bed mobility: Needs Assistance Bed Mobility: Supine to Sit     Supine to sit: Max assist;HOB elevated     General bed mobility comments: Assist with LEs, trunk and to scoot bottom to EOB. DOE noted.  Transfers Overall transfer level: Needs assistance Equipment used: 2  person hand held assist Transfers: Sit to/from [facility] Sit to Stand: Max assist;+2 physical assistance;From elevated surface Stand pivot transfers: Mod assist;+2 physical assistance       General transfer comment: ASsist of 2 to stand from EOB with use of momentum. Able to take a few steps with assist of 2 for weight shifting to get to chair on right side. 2-3/4 DOE.    Balance Overall balance assessment: Needs assistance Sitting-balance support: Feet supported;Single extremity supported Sitting balance-Leahy Scale: Fair Sitting balance - Comments: Min guard for safety.   Standing balance support: During functional activity Standing balance-Leahy Scale: Zero Standing balance comment: Requires external support of 2 for standing.                           ADL either performed or assessed with clinical judgement   ADL Overall ADL's : Needs assistance/impaired     Grooming: Set up;Sitting           Upper Body Dressing : Minimal assistance;Sitting   Lower Body Dressing: Maximal assistance;Sit to/from stand;Sitting/lateral leans   Toilet Transfer: Moderate assistance;+2 for physical assistance;BSC Toilet Transfer Details (indicate cue type and reason): Simulated with stand-pivot transfer to [place]?: No apparent visual deficits     Perception     Praxis      Pertinent Vitals/Pain Pain Assessment: Faces Faces Pain Scale: Hurts little more Pain Location: bil heels Pain Descriptors / Indicators: Aching;Sore;Tender Pain Intervention(s): Repositioned;Monitored during session  Hand Dominance Right   Extremity/Trunk Assessment Upper Extremity Assessment Upper Extremity Assessment: Defer to OT evaluation   Lower Extremity Assessment Lower Extremity Assessment: Generalized weakness (Difficulty lifting LEs against gravity.)       Communication Communication Communication: No  difficulties   Cognition Arousal/Alertness: Awake/alert Behavior During Therapy: WFL for tasks assessed/performed Overall Cognitive Status: Within Functional Limits for tasks assessed                                     General Comments  Sp02 dropped to 86% on 2L/min 02 [place] sitting EOB but recovered quickly with rest and pursed lip breathing. Stayed >95% rest of session.    Exercises     Shoulder Instructions      Home Living Family/patient expects to be discharged to:: Private residence Living Arrangements: Children (daughter) Available Help at Discharge: Family;Available PRN/intermittently;Personal care attendant Type of Home: House Home Access: Level entry     Home Layout: One level     Bathroom Shower/Tub: Teacher, early years/pre: Handicapped height     Home Equipment: Environmental consultant - 2 wheels;Bedside commode;Cane - single point;Shower seat          Prior Functioning/Environment Level of Independence: Needs assistance  Gait / Transfers Assistance Needed: uses rollator for ambulation ADL's / Homemaking Assistance Needed: aide MWF for assist with cleaning, bathing, dressing. Meals on wheels + family for meals, can make her own breakfast at the stove            OT Problem List: Decreased strength;Decreased range of motion;Decreased activity tolerance;Impaired balance (sitting and/or standing);Decreased knowledge of use of DME or AE;Impaired UE functional use      OT Treatment/Interventions: Self-care/ADL training;Therapeutic exercise;Energy conservation;DME and/or AE instruction;Therapeutic activities;Patient/family education;Balance training    OT Goals(Current goals can be found in the care plan section) Acute Rehab OT Goals Patient Stated Goal: To return home.  OT Goal Formulation: With patient Time For Goal Achievement: 04/08/20 Potential to Achieve Goals: Good  OT Frequency: Min 2X/week   Barriers to D/C:            Co-evaluation    Reason for Co-Treatment: To address functional/ADL transfers;For patient/therapist safety PT goals addressed during session: Mobility/safety with mobility;Balance;Strengthening/ROM        AM-PAC OT "6 Clicks" Daily Activity     Outcome Measure Help from another person eating meals?: A Little Help from another person taking care of personal grooming?: A Little Help from another person toileting, which includes using toliet, bedpan, or urinal?: Total Help from another person bathing (including washing, rinsing, drying)?: A Lot Help from another person to put on and taking off regular upper body clothing?: A Little Help from another person to put on and taking off regular lower body clothing?: Total 6 Click Score: 13   End of Session Equipment Utilized During Treatment: Gait belt;Oxygen  Activity Tolerance: Patient tolerated treatment well Patient left: in chair;with call bell/phone within reach;with chair alarm set  OT Visit Diagnosis: Unsteadiness on feet (R26.81);Muscle weakness (generalized) (M62.81)                Time: 1342-1401 OT Time Calculation (min): 19 min Charges:  OT General Charges $OT Visit: 1 Visit OT Evaluation $OT Eval Moderate Complexity: 1 Mod  Dameon Soltis H. OTR/L Supplemental OT, Department of rehab services [phone]  Mckay Brandt R H. 03/25/2020, 3:44 PM

## 2020-03-25 NOTE — Progress Notes (Signed)
Admit: 03/22/2020 LOS: 3  85F AoCKD5 with AoC HFpEF  Subjective:  . Not adequately diuresing, despite increase of furosemide to 120 mg 3 times daily yesterday . Dyspnea stable, weights are not changing, in fact elevated if anything . Creatinine stable, electrolytes stable . Now DNR and goals of care conversation with Dr. Reesa Chew, family would not want dialysis if indicated  07/26 0701 - 07/27 0700 In: 106 [P.O.:780; IV Piggyback:62] Out: 400 [Urine:400]  Filed Weights   03/24/20 0346 03/25/20 0022 03/25/20 0346  Weight: (!) 108.5 kg (!) 109.8 kg (!) 109.8 kg    Scheduled Meds: . amLODipine  10 mg Oral q AM  . aspirin EC  81 mg Oral Daily  . cloNIDine  0.1 mg Oral TID  . ferrous sulfate  325 mg Oral QHS  . heparin  5,000 Units Subcutaneous Q8H  . hydrALAZINE  125 mg Oral TID  . hydrOXYzine  50 mg Oral QHS  . isosorbide mononitrate  120 mg Oral Daily  . lactulose  10 g Oral Q2000  . levothyroxine  75 mcg Oral QAC breakfast  . metoprolol succinate  12.5 mg Oral Daily  . rosuvastatin  10 mg Oral QPM  . sodium chloride flush  3 mL Intravenous Q12H   Continuous Infusions: . sodium chloride    . albumin human 12.5 g (03/25/20 1113)  . furosemide 160 mg (03/25/20 1229)   PRN Meds:.sodium chloride, acetaminophen, docusate sodium, HYDROcodone-acetaminophen, ondansetron (ZOFRAN) IV, polyethylene glycol, senna-docusate, sodium chloride flush, traZODone  Current Labs: reviewed    Physical Exam:  Blood pressure (!) 144/42, pulse 48, temperature 98.6 F (37 C), temperature source Oral, resp. rate 13, height 5\' 5"  (1.651 m), weight (!) 109.8 kg, SpO2 97 %. Elderly female NAD On [place], NCAT RRR ctab no rub 3+ LEE, +sacral edema Crackles in bases No rashes/lesions AAO x3  A 1. AoCKD5 probably 2/2 #2 2. HFpEF with acute exacerbation 3. Hyperkalemia, resolved 4. HTN, BP elevated, hypervolemic 5. Anemia, Hb in 9s 6. Hypervolemia, diuresing poorly  P . Increase Lasix to 160 mg 3  times daily . Patient and family are clear that they would not pursue dialysis if indicated, I think this is very reasonable . If still failure to diurese consider addition of metolazone tomorrow . Has received albumin to try to augment diuresis today . Medication Issues; o Preferred narcotic agents for pain control are hydromorphone, fentanyl, and methadone. Morphine should not be used.  o Baclofen should be avoided o Avoid oral sodium phosphate and magnesium citrate based laxatives / bowel preps    Pearson Grippe MD 03/25/2020, 12:54 PM  Recent Labs  Lab 03/23/20 0635 03/24/20 0456 03/25/20 0425  NA 139 139 135  K 4.7 4.7 4.7  CL 106 105 104  CO2 22 23 23   GLUCOSE 98 96 99  BUN 57* 59* 62*  CREATININE 4.89* 4.88* 4.83*  CALCIUM 8.5* 8.2*  8.1* 8.0*  PHOS  --  5.3*  --    Recent Labs  Lab 03/22/20 1257 03/22/20 1653 03/23/20 0635 03/24/20 0456 03/25/20 0425  WBC 5.0   < > 5.1 5.8 4.3  NEUTROABS 3.9  --  2.9  --   --   HGB 10.2*   < > 9.6* 9.1* 8.9*  HCT 36.4   < > 33.4* 32.2* 31.8*  MCV 92.6   < > 89.5 89.9 90.9  PLT 158   < > 152 145* 131*   < > = values in this interval not displayed.

## 2020-03-25 NOTE — Evaluation (Signed)
Physical Therapy Evaluation Patient Details Name: [name] MRN: [id number] DOB: 08-Sep-1928 Today's Date: 03/25/2020   History of Present Illness  Patient is a 86 y/o female who presents with SOB, LE swelling and pain. Admitted with acute hypoxemic respiratory failure secondary to acute on chronic diastolic CHF. PMH includes CHF, CKD, PVD, DVT, bil TKA,  Clinical Impression  Patient presents with generalized weakness, dyspnea on exertion, impaired balance, decreased activity tolerance and impaired mobility s/p above. Pt lives at home with daughter and has an aide assist 3 days/week with ADLs and iADLs. Pt uses rollator for ambulation and has been getting HHPT PTA (they just started). Today, pt requires Max A for bed mobility and Max A of 2 for standing and 2 assist for SPT to chair. SP02 ranged from 86-95% on 2L/min 02 [place] with 2-3/4 DOE with minimal activity. Pt is not safe to return home alone. Recommend 24/7 supervision for all OOB mobility as pt is a fall risk. At this time recommend SNF to maximize independence and mobility prior to return home. If family able to care for pt at this level, will likely need DME (w/c, hospital bed?) and continued [facility] services at d/c. Will follow acutely.    Follow Up Recommendations SNF;Supervision for mobility/OOB;Supervision/Assistance - 24 hour    Equipment Recommendations  Other (comment) (TBD pending disposition, will need DME for home likely)    Recommendations for Other Services       Precautions / Restrictions Precautions Precautions: Fall Precaution Comments: watch 02 Restrictions Weight Bearing Restrictions: No      Mobility  Bed Mobility Overal bed mobility: Needs Assistance Bed Mobility: Supine to Sit     Supine to sit: Max assist;HOB elevated     General bed mobility comments: Assist with LEs, trunk and to scoot bottom to EOB. DOE noted.  Transfers Overall transfer level: Needs assistance Equipment used: 2 person hand held  assist Transfers: Sit to/from [facility] Sit to Stand: Max assist;+2 physical assistance;From elevated surface Stand pivot transfers: Mod assist;+2 physical assistance       General transfer comment: ASsist of 2 to stand from EOB with use of momentum. Able to take a few steps with assist of 2 for weight shifting to get to chair on right side. 2-3/4 DOE.  Ambulation/Gait             General Gait Details: Unable  Stairs            Wheelchair Mobility    Modified Rankin (Stroke Patients Only)       Balance Overall balance assessment: Needs assistance Sitting-balance support: Feet supported;Single extremity supported Sitting balance-Leahy Scale: Fair Sitting balance - Comments: Min guard for safety.   Standing balance support: During functional activity Standing balance-Leahy Scale: Zero Standing balance comment: Requires external support of 2 for standing.                             Pertinent Vitals/Pain Pain Assessment: Faces Faces Pain Scale: Hurts little more Pain Location: bil heels Pain Descriptors / Indicators: Aching;Sore;Tender Pain Intervention(s): Repositioned;Monitored during session    Home Living Family/patient expects to be discharged to:: Private residence Living Arrangements: Children (daughter) Available Help at Discharge: Family;Available PRN/intermittently;Personal care attendant Type of Home: House Home Access: Level entry     Home Layout: One level Home Equipment: Walker - 2 wheels;Bedside commode;Cane - single point;Shower seat      Prior Function Level of Independence:  Needs assistance   Gait / Transfers Assistance Needed: uses rollator for ambulation  ADL's / Homemaking Assistance Needed: aide MWF for assist with cleaning, bathing, dressing. Meals on wheels + family for meals, can make her own breakfast at the stove        Hand Dominance   Dominant Hand: Right    Extremity/Trunk Assessment    Upper Extremity Assessment Upper Extremity Assessment: Defer to OT evaluation    Lower Extremity Assessment Lower Extremity Assessment: Generalized weakness (Difficulty lifting LEs against gravity.)       Communication   Communication: No difficulties  Cognition Arousal/Alertness: Awake/alert Behavior During Therapy: WFL for tasks assessed/performed Overall Cognitive Status: Within Functional Limits for tasks assessed                                        General Comments General comments (skin integrity, edema, etc.): Sp02 dropped to 86% on 2L/min 02 [place] sitting EOB but recovered quickly with rest and pursed lip breathing. Stayed >95% rest of session.    Exercises     Assessment/Plan    PT Assessment Patient needs continued PT services  PT Problem List Decreased strength;Decreased mobility;Obesity;Decreased balance;Decreased activity tolerance;Cardiopulmonary status limiting activity;Pain       PT Treatment Interventions Therapeutic activities;Therapeutic exercise;Patient/family education;Balance training;Functional mobility training;Gait training    PT Goals (Current goals can be found in the Care Plan section)  Acute Rehab PT Goals Patient Stated Goal: To return home.  PT Goal Formulation: With patient Time For Goal Achievement: 04/08/20 Potential to Achieve Goals: Fair    Frequency Min 2X/week   Barriers to discharge        Co-evaluation PT/OT/SLP Co-Evaluation/Treatment: Yes Reason for Co-Treatment: To address functional/ADL transfers;For patient/therapist safety PT goals addressed during session: Mobility/safety with mobility;Balance;Strengthening/ROM         AM-PAC PT "6 Clicks" Mobility  Outcome Measure Help needed turning from your back to your side while in a flat bed without using bedrails?: Total Help needed moving from lying on your back to sitting on the side of a flat bed without using bedrails?: Total Help needed moving to  and from a bed to a chair (including a wheelchair)?: A Lot Help needed standing up from a chair using your arms (e.g., wheelchair or bedside chair)?: Total Help needed to walk in hospital room?: Total Help needed climbing 3-5 steps with a railing? : Total 6 Click Score: 7    End of Session Equipment Utilized During Treatment: Oxygen Activity Tolerance: Patient tolerated treatment well;Patient limited by fatigue Patient left: in chair;with call bell/phone within reach;with chair alarm set Nurse Communication: Mobility status;Other (comment) (stedy for transfer back to bed) PT Visit Diagnosis: Pain;Muscle weakness (generalized) (M62.81);Difficulty in walking, not elsewhere classified (R26.2);Unsteadiness on feet (R26.81) Pain - Right/Left:  (bil) Pain - part of body: Ankle and joints of foot    Time: 0623-7628 PT Time Calculation (min) (ACUTE ONLY): 33 min   Charges:   PT Evaluation $PT Eval Moderate Complexity: 1 Mod          Marisa Severin, PT, DPT Acute Rehabilitation Services Pager [phone] Office [phone]      Marguarite Arbour A Sabra Heck 03/25/2020, 3:39 PM

## 2020-03-26 DIAGNOSIS — E875 Hyperkalemia: Secondary | ICD-10-CM

## 2020-03-26 DIAGNOSIS — I5033 Acute on chronic diastolic (congestive) heart failure: Secondary | ICD-10-CM

## 2020-03-26 DIAGNOSIS — J9601 Acute respiratory failure with hypoxia: Secondary | ICD-10-CM | POA: Diagnosis not present

## 2020-03-26 LAB — CBC
HCT: 31.5 % — ABNORMAL LOW (ref 36.0–46.0)
Hemoglobin: 9 g/dL — ABNORMAL LOW (ref 12.0–15.0)
MCH: 25.3 pg — ABNORMAL LOW (ref 26.0–34.0)
MCHC: 28.6 g/dL — ABNORMAL LOW (ref 30.0–36.0)
MCV: 88.5 fL (ref 80.0–100.0)
Platelets: 136 10*3/uL — ABNORMAL LOW (ref 150–400)
RBC: 3.56 MIL/uL — ABNORMAL LOW (ref 3.87–5.11)
RDW: 19.9 % — ABNORMAL HIGH (ref 11.5–15.5)
WBC: 5 10*3/uL (ref 4.0–10.5)
nRBC: 0.8 % — ABNORMAL HIGH (ref 0.0–0.2)

## 2020-03-26 LAB — BASIC METABOLIC PANEL
Anion gap: 11 (ref 5–15)
BUN: 64 mg/dL — ABNORMAL HIGH (ref 8–23)
CO2: 23 mmol/L (ref 22–32)
Calcium: 8.7 mg/dL — ABNORMAL LOW (ref 8.9–10.3)
Chloride: 102 mmol/L (ref 98–111)
Creatinine, Ser: 4.77 mg/dL — ABNORMAL HIGH (ref 0.44–1.00)
GFR calc Af Amer: 9 mL/min — ABNORMAL LOW (ref >60)
GFR calc non Af Amer: 8 mL/min — ABNORMAL LOW (ref >60)
Glucose, Bld: 115 mg/dL — ABNORMAL HIGH (ref 70–99)
Potassium: 4.3 mmol/L (ref 3.5–5.1)
Sodium: 136 mmol/L (ref 135–145)

## 2020-03-26 LAB — MAGNESIUM: Magnesium: 2.5 mg/dL — ABNORMAL HIGH (ref 1.7–2.4)

## 2020-03-26 MED ORDER — MORPHINE SULFATE (PF) 2 MG/ML IV SOLN
2.0000 mg | INTRAVENOUS | Status: DC | PRN
  Administered 2020-03-26 – 2020-03-27 (×5): 2 mg via INTRAVENOUS
  Filled 2020-03-26 (×5): qty 1

## 2020-03-26 MED ORDER — METOLAZONE 5 MG PO TABS: 5.0000 mg | ORAL_TABLET | Freq: Two times a day (BID) | ORAL | Status: DC

## 2020-03-26 MED ORDER — METOLAZONE 5 MG PO TABS: 5.0000 mg | ORAL_TABLET | Freq: Once | ORAL | Status: DC

## 2020-03-26 MED ORDER — METOLAZONE 5 MG PO TABS
5.0000 mg | ORAL_TABLET | Freq: Two times a day (BID) | ORAL | Status: AC
  Administered 2020-03-26 (×2): 5 mg via ORAL
  Filled 2020-03-26 (×2): qty 1

## 2020-03-26 MED ORDER — MORPHINE SULFATE (PF) 2 MG/ML IV SOLN
INTRAVENOUS | Status: AC
  Administered 2020-03-26: 2 mg via INTRAVENOUS
  Filled 2020-03-26: qty 1

## 2020-03-26 NOTE — TOC Initial Note (Signed)
Transition of Care [facility]) - Initial/Assessment Note    Patient Details  Name: [name] MRN: [id number] Date of Birth: [birth year]  Transition of Care [facility]) CM/SW Contact:    Trula Ore, [place] Phone Number: 03/26/2020, 5:20 PM  Clinical Narrative:                  CSW spoke with patient and patients daughter at bedside. Patients daughter [name] is agreeable to SNF placement for patient. Patients daughter gave CSW permission to fax out initial referral to [place] area.   Pending bed offers. CSW will start insurance authorization for patient.  CSW will continue to follow.  Expected Discharge Plan: Skilled Nursing Facility Barriers to Discharge: Continued Medical Work up   Patient Goals and CMS Choice Patient states their goals for this hospitalization and ongoing recovery are:: to go to SNF CMS Medicare.gov Compare Post Acute Care list provided to:: Patient Represenative (must comment) ([name]) Choice offered to / list presented to : Adult Children Physicist, medical)  Expected Discharge Plan and Services Expected Discharge Plan: [place] arrangements for the past 2 months: Single Family Home                                      Prior Living Arrangements/Services Living arrangements for the past 2 months: Single Family Home Lives with:: Self Patient language and need for interpreter reviewed:: Yes Do you feel safe going back to the place where you live?: No   SNF  Need for Family Participation in Patient Care: Yes (Comment) Care giver support system in place?: Yes (comment)   Criminal Activity/Legal Involvement Pertinent to Current Situation/Hospitalization: No - Comment as needed  Activities of Daily Living      Permission Sought/Granted Permission sought to share information with : Case Manager, Family Supports, Customer service manager Permission granted to share information with : Yes, Verbal Permission  Granted  Share Information with NAME: [name]  Permission granted to share info w AGENCY: SNF  Permission granted to share info w Relationship: Daughter  Permission granted to share info w Contact Information: [name] [phone]  Emotional Assessment Appearance:: Appears stated age Attitude/Demeanor/Rapport: Gracious Affect (typically observed): Calm Orientation: :  (WDL) Alcohol / Substance Use: Not Applicable Psych Involvement: No (comment)  Admission diagnosis:  Acute respiratory failure with hypoxia ([place]) [J96.01] Dyspnea, unspecified type [R06.00] Patient Active Problem List   Diagnosis Date Noted  . HLD (hyperlipidemia) 03/22/2020  . Depression 03/22/2020  . Hypothyroidism 03/22/2020  . AKI (acute kidney injury) ([place]) 02/24/2020  . Acute on chronic diastolic heart failure ([place]) 02/16/2020  . Hypertension 01/03/2018  . Atypical chest pain 01/03/2018  . Sinus bradycardia 01/03/2018  . Hypertensive heart and kidney disease with heart failure and with chronic kidney disease stage IV ([place])   . Fever, unspecified 01/06/2016  . Acute respiratory failure with hypoxia ([place]) 01/01/2016  . Chronic diastolic heart failure ([place]) 01/01/2016  . History of DVT (deep vein thrombosis) 01/01/2016  . Chronic kidney disease, stage IV (severe) ([place]) 01/01/2016  . Anemia of renal disease 01/01/2016  . Iron deficiency anemia 01/01/2016  . Hyperkalemia 11/21/2014  . Diarrhea 11/21/2014  . Metabolic acidosis 27/78/2423  . Renal failure (ARF), acute on chronic (HCC) 11/21/2014   PCP:  Antonietta Jewel, MD Pharmacy:   [place] [place] [facility]  [place] [address] [place] [address] Phone: [phone] Fax: [phone]  [facility] - [place], [place] - [address] [place] [address] Phone: [phone] Fax: [phone]     Social Determinants  of Health (SDOH) Interventions    Readmission Risk Interventions Readmission Risk Prevention Plan 02/19/2020  Transportation Screening Complete  HRI or Home Care Consult Complete  Social Work Consult for [place] Planning/Counseling Complete  Palliative Care Screening Not Applicable  Medication Review Press photographer) Complete  Some recent data might be hidden

## 2020-03-26 NOTE — Progress Notes (Addendum)
[facility] PROGRESS NOTE    Progress Note  [name]  GQB:[id number] DOB: 03-26-1929 DOA: 03/22/2020 PCP: Antonietta Jewel, MD     Brief Narrative:   [name] is an 86 y.o. female past medical history significant for chronic diastolic heart failure, chronic kidney disease stage IV, anemia of chronic disease essential hypertension History of DVT morbid obesity presents to the ED with worsening shortness of breath, chest x-ray showed pulmonary edema with elevated BNP.  Nephrology was consulted due to worsening renal function.  Assessment/Plan:   Acute respiratory failure with hypoxia (HCC) due to acute diastolic heart failure and bilateral pleural effusions: Continue high-dose Lasix at 160 mg every 8, he is responding poorly to current IV Lasix. Add metolazone orally for 2 doses, to see how she responds, also discontinue Norvasc and clonidine to have better  blood pressure margins. Estimated dry weight is below 100 kg, she is currently 108. At this point in time she is a poor candidate for advanced renal replacement therapy. Question of cardiorenal syndrome, see below.  Acute on chronic kidney disease stage/cardiorenal syndrome/metabolic acidosis: With a baseline creatinine around 3.4 admission 4.9, today is 4.8. Nephrology was consulted Raquel Sarna was very clear that with they will like not to pursue HD, which seems reasonable. We will add metolazone. Metabolic acidosis is resolved.  Lower extremity pain and swelling/history of DVT: Not on anticoagulation, suspect likely due from fluid overload, lower extremity Doppler was negative. Use hydromorphone and fentanyl for pain control, will try to avoid morphine.  Hypokalemia: Resolved with diuresis and Lokelma.  Sinus bradycardia: Appears to be chronic, currently asymptomatic, continue current dose of beta-blocker. TSH is mildly elevated to 6.4.  Essential hypertension: Continue Norvasc metoprolol hydralazine and  clonidine. Blood pressure seems to be fairly controlled.  Hyperlipidemia: Continue statins.  Anemia of chronic renal disease: Currently around 9 no signs of overt bleeding.  Hypothyroidism: Continue Synthroid.  Gout: Holding allopurinol due to renal dysfunction.  Depression/anxiety: Continue trazodone as needed.  Goals of care: The previous physician had a conversation with daughter and son, they both agree that the patient is to be a DNR DNI, they also expressed that they do not want dialysis if her renal function worsen which I think it is reasonable due to her age and multiple comorbidities.      DVT prophylaxis: lovenox Family Communication: Son and daughter Status is: Inpatient  Remains inpatient appropriate because:Hemodynamically unstable   Dispo: The patient is from: Home              Anticipated d/c is to: SNF              Anticipated d/c date is: > 3 days              Patient currently is not medically stable to d/c.        Code Status:     Code Status Orders  (From admission, onward)         Start     Ordered   03/25/20 1126  Do not attempt resuscitation (DNR)  Continuous       Question Answer Comment  In the event of cardiac or respiratory ARREST Do not call a "code blue"   In the event of cardiac or respiratory ARREST Do not perform Intubation, CPR, defibrillation or ACLS   In the event of cardiac or respiratory ARREST Use medication by any route, position, wound care, and other measures to relive pain and suffering. May use oxygen, suction  and manual treatment of airway obstruction as needed for comfort.      03/25/20 1125        Code Status History    Date Active Date Inactive Code Status Order ID Comments User Context   03/22/2020 1406 03/25/2020 1125 Full Code [id number]  Mckinley Jewel, MD ED   02/16/2020 2026 02/24/2020 1935 Full Code [id number]  Sueanne Margarita, DO Inpatient   01/01/2016 1950 01/07/2016 1620 DNR [id number]  Janece Canterbury,  MD Inpatient   11/22/2014 0146 11/24/2014 1755 Full Code [id number]  Rise Patience, MD ED   Advance Care Planning Activity        IV Access:    Peripheral IV   Procedures and diagnostic studies:   DG Chest 2 View  Result Date: 03/25/2020 CLINICAL DATA:  Pleural effusion EXAM: CHEST - 2 VIEW COMPARISON:  March 22, 2020 FINDINGS: There is cardiomegaly with pulmonary venous hypertension. Small pleural effusions noted. There is consolidation in the left lower lung region. There is mild interstitial edema. No adenopathy. There is aortic atherosclerosis. There is degenerative change in the thoracic spine. IMPRESSION: Cardiomegaly with pulmonary vascular congestion. There is a degree of interstitial edema and bilateral pleural effusions consistent with congestive heart failure. Question alveolar edema versus pneumonia left lower lobe region. Both edema and pneumonia may be present concurrently. Aortic Atherosclerosis (ICD10-I70.0). Electronically Signed   By: Lowella Grip III M.D.   On: 03/25/2020 07:23     Medical Consultants:    None.  Anti-Infectives:   none  Subjective:    [name] she relates her breathing is not better feels tired and fatigued.  Objective:    Vitals:   03/25/20 1630 03/25/20 2009 03/26/20 0100 03/26/20 0537  BP:   (!) 124/51 (!) 122/57  Pulse:   52 54  Resp:   15   Temp: 98.2 F (36.8 C) 97.8 F (36.6 C) 98.6 F (37 C) 97.7 F (36.5 C)  TempSrc: Oral Oral Oral Oral  SpO2:   96%   Weight:   (!) 108.4 kg   Height:       SpO2: 96 % O2 Flow Rate (L/min): 2 L/min   Intake/Output Summary (Last 24 hours) at 03/26/2020 0736 Last data filed at 03/26/2020 0538 Gross per 24 hour  Intake 600 ml  Output 775 ml  Net -175 ml   Filed Weights   03/25/20 0022 03/25/20 0346 03/26/20 0100  Weight: (!) 109.8 kg (!) 109.8 kg (!) 108.4 kg    Exam: General exam: In no acute distress. Respiratory system: Good air movement and clear to  auscultation. Cardiovascular system: S1 & S2 heard, RRR. +JVD Gastrointestinal system: Abdomen is nondistended, soft and nontender.  Extremities: Anasarca 3+ edema Skin: No rashes, lesions or ulcers Psychiatry: Judgement and insight appear normal. Mood & affect appropriate.    Data Reviewed:    Labs: Basic Metabolic Panel: Recent Labs  Lab 03/22/20 1653 03/22/20 1653 03/23/20 2841 03/23/20 3244 03/24/20 0456 03/24/20 0456 03/25/20 0425 03/26/20 0549  NA 138  --  139  --  139  --  135 136  K 5.5*   < > 4.7   < > 4.7   < > 4.7 4.3  CL 105  --  106  --  105  --  104 102  CO2 21*  --  22  --  23  --  23 23  GLUCOSE 116*  --  98  --  96  --  99 115*  BUN  62*  --  57*  --  59*  --  62* 64*  CREATININE 4.85*  --  4.89*  --  4.88*  --  4.83* 4.77*  CALCIUM 8.6*   < > 8.5*  --  8.2*  8.1*  --  8.0* 8.7*  MG 2.7*  --   --   --  2.5*  --  2.5* 2.5*  PHOS  --   --   --   --  5.3*  --   --   --    < > = values in this interval not displayed.   GFR Estimated Creatinine Clearance: 9.8 mL/min (A) (by C-G formula based on SCr of 4.77 mg/dL (H)). Liver Function Tests: Recent Labs  Lab 03/22/20 1257 03/24/20 0456  AST 19  --   ALT 12  --   ALKPHOS 66  --   BILITOT 0.5  --   PROT 6.0*  --   ALBUMIN 3.3* 2.8*   No results for input(s): LIPASE, AMYLASE in the last 168 hours. No results for input(s): AMMONIA in the last 168 hours. Coagulation profile Recent Labs  Lab 03/22/20 1257  INR 1.2   COVID-19 Labs  Recent Labs    03/24/20 0456  FERRITIN 324*    Lab Results  Component Value Date   [place] NEGATIVE 03/22/2020    CBC: Recent Labs  Lab 03/22/20 1257 03/22/20 1257 03/22/20 1653 03/23/20 0635 03/24/20 0456 03/25/20 0425 03/26/20 0549  WBC 5.0   < > 5.1 5.1 5.8 4.3 5.0  NEUTROABS 3.9  --   --  2.9  --   --   --   HGB 10.2*   < > 10.0* 9.6* 9.1* 8.9* 9.0*  HCT 36.4   < > 36.3 33.4* 32.2* 31.8* 31.5*  MCV 92.6   < > 90.3 89.5 89.9 90.9 88.5  PLT 158    < > 160 152 145* 131* 136*   < > = values in this interval not displayed.   Cardiac Enzymes: No results for input(s): CKTOTAL, CKMB, CKMBINDEX, TROPONINI in the last 168 hours. BNP (last 3 results) No results for input(s): PROBNP in the last 8760 hours. CBG: No results for input(s): GLUCAP in the last 168 hours. D-Dimer: No results for input(s): DDIMER in the last 72 hours. Hgb A1c: No results for input(s): HGBA1C in the last 72 hours. Lipid Profile: No results for input(s): CHOL, HDL, LDLCALC, TRIG, CHOLHDL, LDLDIRECT in the last 72 hours. Thyroid function studies: No results for input(s): TSH, T4TOTAL, T3FREE, THYROIDAB in the last 72 hours.  Invalid input(s): FREET3 Anemia work up: Recent Labs    03/24/20 0456  VITAMINB12 1,336*  FOLATE 26.6  FERRITIN 324*  TIBC 216*  IRON 20*  RETICCTPCT 3.7*   Sepsis Labs: Recent Labs  Lab 03/23/20 0635 03/24/20 0456 03/25/20 0425 03/26/20 0549  WBC 5.1 5.8 4.3 5.0   Microbiology Recent Results (from the past 240 hour(s))  SARS Coronavirus 2 by RT PCR (hospital order, performed in [facility] hospital lab) Nasopharyngeal Nasopharyngeal Swab     Status: None   Collection Time: 03/22/20 12:47 PM   Specimen: Nasopharyngeal Swab  Result Value Ref Range Status   SARS Coronavirus 2 NEGATIVE NEGATIVE Final    Comment: (NOTE) SARS-CoV-2 target nucleic acids are NOT DETECTED.  The SARS-CoV-2 RNA is generally detectable in upper and lower respiratory specimens during the acute phase of infection. The lowest concentration of SARS-CoV-2 viral copies this assay can detect is 250 copies /  mL. A negative result does not preclude SARS-CoV-2 infection and should not be used as the sole basis for treatment or other patient management decisions.  A negative result may occur with improper specimen collection / handling, submission of specimen other than nasopharyngeal swab, presence of viral mutation(s) within the areas targeted by this  assay, and inadequate number of viral copies (<250 copies / mL). A negative result must be combined with clinical observations, patient history, and epidemiological information.  Fact Sheet for Patients:   StrictlyIdeas.no  Fact Sheet for Healthcare Providers: BankingDealers.co.za  This test is not yet approved or  cleared by the [place] FDA and has been authorized for detection and/or diagnosis of SARS-CoV-2 by FDA under an Emergency Use Authorization (EUA).  This EUA will remain in effect (meaning this test can be used) for the duration of the COVID-19 declaration under Section 564(b)(1) of the Act, 21 U.S.C. section 360bbb-3(b)(1), unless the authorization is terminated or revoked sooner.  Performed at [facility], [place] [address] [place], [place] [address]      Medications:   . amLODipine  10 mg Oral q AM  . aspirin EC  81 mg Oral Daily  . cloNIDine  0.1 mg Oral TID  . ferrous sulfate  325 mg Oral QHS  . heparin  5,000 Units Subcutaneous Q8H  . hydrALAZINE  125 mg Oral TID  . hydrOXYzine  50 mg Oral QHS  . isosorbide mononitrate  120 mg Oral Daily  . lactulose  10 g Oral Q2000  . levothyroxine  75 mcg Oral QAC breakfast  . metoprolol succinate  12.5 mg Oral Daily  . rosuvastatin  10 mg Oral QPM  . sodium chloride flush  3 mL Intravenous Q12H   Continuous Infusions: . sodium chloride    . furosemide 160 mg (03/26/20 0606)      LOS: 4 days   Charlynne Cousins  [facility]  03/26/2020, 7:36 AM

## 2020-03-26 NOTE — Progress Notes (Signed)
Admit: 03/22/2020 LOS: 4  79F AoCKD5 with AoC HFpEF  Subjective:  . Still not adequately diuresing on lasix 160 TID . Metolazone 5 mg twice daily added this morning . Remains dyspneic, weights unchanged,  . Creatinine stable, electrolytes stable . Now DNR and goals of care conversation with Dr. Reesa Chew, family would not want dialysis if indicated  07/27 0701 - 07/28 0700 In: 600 [P.O.:600] Out: 775 [Urine:775]  Filed Weights   03/25/20 0022 03/25/20 0346 03/26/20 0100  Weight: (!) 109.8 kg (!) 109.8 kg (!) 108.4 kg    Scheduled Meds: . aspirin EC  81 mg Oral Daily  . ferrous sulfate  325 mg Oral QHS  . heparin  5,000 Units Subcutaneous Q8H  . hydrALAZINE  125 mg Oral TID  . hydrOXYzine  50 mg Oral QHS  . isosorbide mononitrate  120 mg Oral Daily  . lactulose  10 g Oral Q2000  . levothyroxine  75 mcg Oral QAC breakfast  . metolazone  5 mg Oral BID  . metoprolol succinate  12.5 mg Oral Daily  . rosuvastatin  10 mg Oral QPM  . sodium chloride flush  3 mL Intravenous Q12H   Continuous Infusions: . sodium chloride    . furosemide 160 mg (03/26/20 0606)   PRN Meds:.sodium chloride, acetaminophen, docusate sodium, HYDROcodone-acetaminophen, morphine injection, ondansetron (ZOFRAN) IV, polyethylene glycol, senna-docusate, sodium chloride flush, traZODone  Current Labs: reviewed    Physical Exam:  Blood pressure (!) 137/55, pulse 63, temperature 97.7 F (36.5 C), temperature source Oral, resp. rate 18, height 5\' 5"  (1.651 m), weight (!) 108.4 kg, SpO2 91 %. Elderly female NAD On [place], NCAT RRR ctab no rub 3+ LEE, +sacral edema Crackles in bases No rashes/lesions AAO x3  A 1. AoCKD5 probably 2/2 #2 2. HFpEF with acute exacerbation 3. Hyperkalemia, resolved 4. HTN, BP elevated, hypervolemic 5. Anemia, Hb in 9s 6. Hypervolemia, diuresing poorly  P . Continue current Lasix dosing, agree with addition of metolazone . Patient and family are clear that they would not pursue  dialysis if indicated, I think this is very reasonable . Medication Issues; o Preferred narcotic agents for pain control are hydromorphone, fentanyl, and methadone. Morphine should not be used.  o Baclofen should be avoided o Avoid oral sodium phosphate and magnesium citrate based laxatives / bowel preps    Pearson Grippe MD 03/26/2020, 11:28 AM  Recent Labs  Lab 03/24/20 0456 03/25/20 0425 03/26/20 0549  NA 139 135 136  K 4.7 4.7 4.3  CL 105 104 102  CO2 23 23 23   GLUCOSE 96 99 115*  BUN 59* 62* 64*  CREATININE 4.88* 4.83* 4.77*  CALCIUM 8.2*  8.1* 8.0* 8.7*  PHOS 5.3*  --   --    Recent Labs  Lab 03/22/20 1257 03/22/20 1653 03/23/20 0635 03/23/20 0635 03/24/20 0456 03/25/20 0425 03/26/20 0549  WBC 5.0   < > 5.1   < > 5.8 4.3 5.0  NEUTROABS 3.9  --  2.9  --   --   --   --   HGB 10.2*   < > 9.6*   < > 9.1* 8.9* 9.0*  HCT 36.4   < > 33.4*   < > 32.2* 31.8* 31.5*  MCV 92.6   < > 89.5   < > 89.9 90.9 88.5  PLT 158   < > 152   < > 145* 131* 136*   < > = values in this interval not displayed.

## 2020-03-26 NOTE — TOC Progression Note (Signed)
Transition of Care [facility]) - Progression Note    Patient Details  Name: [name] MRN: [id number] Date of Birth: 24-Apr-1929  Transition of Care [facility]) CM/SW [place], [place] Phone Number: 03/26/2020, 5:29 PM  Clinical Narrative:     CSW started insurance authorization reference number # [id number]. CSW faxed over clinicals for review.  Pending bed offers. CSW will continue to follow.  Expected Discharge Plan: Skilled Nursing Facility Barriers to Discharge: Continued Medical Work up  Expected Discharge Plan and Services Expected Discharge Plan: [place] arrangements for the past 2 months: Single Family Home                                       Social Determinants of Health (SDOH) Interventions    Readmission Risk Interventions Readmission Risk Prevention Plan 02/19/2020  Transportation Screening Complete  HRI or Home Care Consult Complete  Social Work Consult for [place] Planning/Counseling Complete  Palliative Care Screening Not Applicable  Medication Review Press photographer) Complete  Some recent data might be hidden

## 2020-03-26 NOTE — NC FL2 (Signed)
[place] LEVEL OF CARE SCREENING TOOL     IDENTIFICATION  Patient Name: [name] Birthdate: Apr 19, 1929 Sex: female Admission Date (Current Location): 03/22/2020  [facility] and [place] Number:  Herbalist and Address:  [facility] [facility], [place] [address], [place], [place] [address]      Provider Number: [id number]  Attending Physician Name and Address:  Charlynne Cousins, MD  Relative Name and Phone Number:       Current Level of Care: Hospital Recommended Level of Care: [place] Prior Approval Number:    Date Approved/Denied:   PASRR Number:    Discharge Plan: SNF    Current Diagnoses: Patient Active Problem List   Diagnosis Date Noted  . HLD (hyperlipidemia) 03/22/2020  . Depression 03/22/2020  . Hypothyroidism 03/22/2020  . AKI (acute kidney injury) ([place]) 02/24/2020  . Acute on chronic diastolic heart failure ([place]) 02/16/2020  . Hypertension 01/03/2018  . Atypical chest pain 01/03/2018  . Sinus bradycardia 01/03/2018  . Hypertensive heart and kidney disease with heart failure and with chronic kidney disease stage IV ([place])   . Fever, unspecified 01/06/2016  . Acute respiratory failure with hypoxia ([place]) 01/01/2016  . Chronic diastolic heart failure ([place]) 01/01/2016  . History of DVT (deep vein thrombosis) 01/01/2016  . Chronic kidney disease, stage IV (severe) ([place]) 01/01/2016  . Anemia of renal disease 01/01/2016  . Iron deficiency anemia 01/01/2016  . Hyperkalemia 11/21/2014  . Diarrhea 11/21/2014  . Metabolic acidosis 92/42/6834  . Renal failure (ARF), acute on chronic (HCC) 11/21/2014    Orientation RESPIRATION BLADDER Height & Weight     Self, Time, Situation, Place  Normal Incontinent, External catheter Weight: (!) 238 lb 15.7 oz (108.4 kg) Height:  5\' 5"  (165.1 cm)  BEHAVIORAL SYMPTOMS/MOOD NEUROLOGICAL BOWEL NUTRITION STATUS      Continent Diet (see discharge summary)  AMBULATORY  STATUS COMMUNICATION OF NEEDS Skin   Extensive Assist Verbally Other (Comment) (MASD on breasts/buttocks/groin)                       Personal Care Assistance Level of Assistance  Bathing, Dressing, Feeding Bathing Assistance: Maximum assistance Feeding assistance: Independent Dressing Assistance: Maximum assistance     Functional Limitations Info  Sight, Hearing, Speech Sight Info: Adequate Hearing Info: Adequate Speech Info: Adequate    SPECIAL CARE FACTORS FREQUENCY  PT (By licensed PT), OT (By licensed OT)     PT Frequency: 5x week OT Frequency: 5x week            Contractures Contractures Info: Not present    Additional Factors Info  Code Status, Allergies Code Status Info: DNR Allergies Info: Naprosyn (Naproxen)           Current Medications (03/26/2020):  This is the current hospital active medication list Current Facility-Administered Medications  Medication Dose Route Frequency Provider Last Rate Last Admin  . 0.9 %  sodium chloride infusion  250 mL Intravenous PRN Pahwani, Rinka R, MD      . acetaminophen (TYLENOL) tablet 650 mg  650 mg Oral Q6H PRN Amin, Ankit Chirag, MD      . aspirin EC tablet 81 mg  81 mg Oral Daily Pahwani, Rinka R, MD   81 mg at 03/26/20 0835  . docusate sodium (COLACE) capsule 100 mg  100 mg Oral QHS PRN Pahwani, Rinka R, MD      . ferrous sulfate tablet 325 mg  325 mg Oral  QHS Pahwani, Rinka R, MD   325 mg at 03/25/20 2020  . furosemide (LASIX) 160 mg in dextrose 5 % 50 mL IVPB  160 mg Intravenous Q8H Pearson Grippe B, MD 62 mL/hr at 03/26/20 1445 160 mg at 03/26/20 1445  . heparin injection 5,000 Units  5,000 Units Subcutaneous Q8H Pahwani, Rinka R, MD   5,000 Units at 03/26/20 0608  . hydrALAZINE (APRESOLINE) tablet 125 mg  125 mg Oral TID Mckinley Jewel, MD   125 mg at 03/26/20 0837  . HYDROcodone-acetaminophen (NORCO) 10-325 MG per tablet 1 tablet  1 tablet Oral Q4H PRN Pahwani, Michell Heinrich, MD   1 tablet at 03/26/20 0248  .  hydrOXYzine (ATARAX/VISTARIL) tablet 50 mg  50 mg Oral QHS Pahwani, Rinka R, MD   50 mg at 03/25/20 2019  . isosorbide mononitrate (IMDUR) 24 hr tablet 120 mg  120 mg Oral Daily Pahwani, Rinka R, MD   120 mg at 03/26/20 0837  . lactulose (CHRONULAC) 10 GM/15ML solution 10 g  10 g Oral Q2000 Pahwani, Rinka R, MD   10 g at 03/25/20 2019  . levothyroxine (SYNTHROID) tablet 75 mcg  75 mcg Oral QAC breakfast Pahwani, Rinka R, MD   75 mcg at 03/26/20 0608  . metolazone (ZAROXOLYN) tablet 5 mg  5 mg Oral BID Charlynne Cousins, MD   5 mg at 03/26/20 0835  . metoprolol succinate (TOPROL-XL) 24 hr tablet 12.5 mg  12.5 mg Oral Daily Pahwani, Rinka R, MD   12.5 mg at 03/26/20 0835  . morphine 2 MG/ML injection 2 mg  2 mg Intravenous Q2H PRN Charlynne Cousins, MD   2 mg at 03/26/20 1227  . ondansetron (ZOFRAN) injection 4 mg  4 mg Intravenous Q6H PRN Pahwani, Rinka R, MD      . polyethylene glycol (MIRALAX / GLYCOLAX) packet 17 g  17 g Oral Daily PRN Amin, Ankit Chirag, MD      . rosuvastatin (CRESTOR) tablet 10 mg  10 mg Oral QPM Pahwani, Rinka R, MD   10 mg at 03/25/20 1641  . senna-docusate (Senokot-S) tablet 2 tablet  2 tablet Oral QHS PRN Amin, Ankit Chirag, MD      . sodium chloride flush (NS) 0.9 % injection 3 mL  3 mL Intravenous Q12H Pahwani, Rinka R, MD   3 mL at 03/26/20 1120  . sodium chloride flush (NS) 0.9 % injection 3 mL  3 mL Intravenous PRN Pahwani, Rinka R, MD      . traZODone (DESYREL) tablet 50 mg  50 mg Oral QHS PRN Amin, Jeanella Flattery, MD         Discharge Medications: Please see discharge summary for a list of discharge medications.  Relevant Imaging Results:  Relevant Lab Results:   Additional Information SS#[id number] [place], [place]

## 2020-03-27 DIAGNOSIS — I5033 Acute on chronic diastolic (congestive) heart failure: Secondary | ICD-10-CM | POA: Diagnosis not present

## 2020-03-27 DIAGNOSIS — J9601 Acute respiratory failure with hypoxia: Secondary | ICD-10-CM | POA: Diagnosis not present

## 2020-03-27 DIAGNOSIS — E875 Hyperkalemia: Secondary | ICD-10-CM | POA: Diagnosis not present

## 2020-03-27 MED ORDER — HYDROMORPHONE HCL 1 MG/ML IJ SOLN
1.0000 mg | Freq: Once | INTRAMUSCULAR | Status: AC
  Administered 2020-03-27: 1 mg via INTRAVENOUS
  Filled 2020-03-27: qty 1

## 2020-03-27 MED ORDER — IPRATROPIUM-ALBUTEROL 0.5-2.5 (3) MG/3ML IN SOLN
3.0000 mL | RESPIRATORY_TRACT | Status: DC | PRN
  Administered 2020-03-27: 3 mL via RESPIRATORY_TRACT

## 2020-03-27 MED ORDER — SODIUM CHLORIDE 0.9 % IV SOLN
0.5000 mg/h | INTRAVENOUS | Status: DC
  Administered 2020-03-27 – 2020-03-28 (×2): 0.5 mg/h via INTRAVENOUS
  Filled 2020-03-27 (×3): qty 2.5

## 2020-03-27 NOTE — Progress Notes (Signed)
Nutrition Brief Note  Chart reviewed. Pt now transitioning to comfort care.  No further nutrition interventions warranted at this time.  Please re-consult as needed.   Ahyan Kreeger W, RD, LDN, CDCES Registered Dietitian II Certified Diabetes Care and Education Specialist Please refer to AMION for RD and/or RD on-call/weekend/after hours pager  

## 2020-03-27 NOTE — TOC Progression Note (Addendum)
Transition of Care [facility]) - Progression Note    Patient Details  Name: [name] MRN: [id number] Date of Birth: September 29, 1928  Transition of Care [facility]) CM/SW [place], [place] Phone Number: 03/27/2020, 1:05 PM  Clinical Narrative:     Darrol Jump with [facility] called CSW back. Bevely Palmer will reach out to family about residential hospice at [facility] for patient.   CSW will continue to follow.  CSW left voicemail for Farrel Gordon with [facility] requesting residential hospice. CSW awaiting callback.  CSW will continue to follow.  Expected Discharge Plan: Skilled Nursing Facility Barriers to Discharge: Continued Medical Work up  Expected Discharge Plan and Services Expected Discharge Plan: [place] arrangements for the past 2 months: Single Family Home                                       Social Determinants of Health (SDOH) Interventions    Readmission Risk Interventions Readmission Risk Prevention Plan 02/19/2020  Transportation Screening Complete  HRI or Home Care Consult Complete  Social Work Consult for [place] Planning/Counseling Complete  Palliative Care Screening Not Applicable  Medication Review Press photographer) Complete  Some recent data might be hidden

## 2020-03-27 NOTE — Progress Notes (Signed)
After having a long discussion with with her children and the patient and they decided to move towards comfort care. They want all medications that are not related to comfort stop and they would like to transition towards comfort care, very would prefer to try to get into a residential hospice facility like [facility]. Stop all of her medications start her on IV Dilaudid for shortness of breath.

## 2020-03-27 NOTE — Significant Event (Signed)
HOSPITAL MEDICINE OVERNIGHT EVENT NOTE  Patient continues to complain of SOB despite being on 8LPM via [place] and receiving PRN IV Morphine for SOB/air hunger.    SpO2 borderline at 90%.  Patient is tachypnic near 30 breaths/min.  Patient is exhibiting poor urine output despite high dose Lasix/Metolazone.  For now, I have recommended that respiratory transition patient to high flow O2 delivery to help with sensation of breathlessness and tachypnia. If this is unsuccessful, intermittent NIPPV can be considered.  Sherryll Burger Vivi Piccirilli

## 2020-03-27 NOTE — Progress Notes (Addendum)
Pt experiencing constant air hunger. O2 sat stable between 90-95% on 8L. PRN morphine 2mg  IV given, some relief. AM Lasix started. Provider notified. Continuing to monitor.   RT notified, will transition pt to HFNC. Duoneb PRN also given. Pt has relaxed, O2 sat now 96%. Still signs of air hunger, but much improved.

## 2020-03-27 NOTE — Progress Notes (Signed)
[facility] PROGRESS NOTE    Progress Note  [name]  GXQ:[id number] DOB: 08-14-1929 DOA: 03/22/2020 PCP: Antonietta Jewel, MD     Brief Narrative:   [name] is an 86 y.o. female past medical history significant for chronic diastolic heart failure, chronic kidney disease stage IV, anemia of chronic disease essential hypertension History of DVT morbid obesity presents to the ED with worsening shortness of breath, chest x-ray showed pulmonary edema with elevated BNP.  Nephrology was consulted due to worsening renal function.  Assessment/Plan:   Acute respiratory failure with hypoxia (HCC) due to acute diastolic heart failure and bilateral pleural effusions: She was on high-dose Lasix and metolazone and she has responded poorly, today she is positive. Her blood pressure continues to be stable. And is now on heated high flow O2 of 100% and a flow rate of 25 and the patient is satting greater 90%. We will discuss with family as she is not responding to treatment, but the most viable option would be to move towards comfort care. The patient and family would not pursue advanced renal replacement therapy. Continue morphine IV as needed for dyspnea.  Acute on chronic kidney disease stage/cardiorenal syndrome/metabolic acidosis: With a baseline creatinine around 3.4 admission 4.9, today is 4.8. Nephrology was consulted family was very clear that they would not pursue hemodialysis. Will have to move towards comfort care.  Lower extremity pain and swelling/history of DVT: Not on anticoagulation, suspect likely due from fluid overload, lower extremity Doppler was negative. Use hydromorphone and fentanyl for pain control, will try to avoid morphine.  Hyperkalemia: Resolved with diuresis and [place].  Sinus bradycardia: Appears to be chronic, currently asymptomatic, continue current dose of beta-blocker. TSH is mildly elevated to 6.4.  Essential hypertension: Continue Norvasc  metoprolol hydralazine and clonidine. Blood pressure seems to be fairly controlled.  Hyperlipidemia: Continue statins.  Anemia of chronic renal disease: Currently around 9 no signs of overt bleeding.  Hypothyroidism: Continue Synthroid.  Gout: Holding allopurinol due to renal dysfunction.  Depression/anxiety: Continue trazodone as needed.  Goals of care: The previous physician had a conversation with daughter and son, they both agree that the patient is to be a DNR DNI, they also expressed that they do not want dialysis if her renal function worsen which I think it is reasonable due to her age and multiple comorbidities.      DVT prophylaxis: lovenox Family Communication: Son and daughter Status is: Inpatient  Remains inpatient appropriate because:Hemodynamically unstable   Dispo: The patient is from: Home              Anticipated d/c is to: SNF              Anticipated d/c date is: > 3 days              Patient currently is not medically stable to d/c.        Code Status:     Code Status Orders  (From admission, onward)         Start     Ordered   03/25/20 1126  Do not attempt resuscitation (DNR)  Continuous       Question Answer Comment  In the event of cardiac or respiratory ARREST Do not call a code blue   In the event of cardiac or respiratory ARREST Do not perform Intubation, CPR, defibrillation or ACLS   In the event of cardiac or respiratory ARREST Use medication by any route, position, wound care, and other  measures to relive pain and suffering. May use oxygen, suction and manual treatment of airway obstruction as needed for comfort.      03/25/20 1125        Code Status History    Date Active Date Inactive Code Status Order ID Comments User Context   03/22/2020 1406 03/25/2020 1125 Full Code [id number]  [name] Jewel, MD ED   02/16/2020 2026 02/24/2020 1935 Full Code [id number]  [name], [place] Inpatient   01/01/2016 1950 01/07/2016 1620 DNR  [id number]  [name] Canterbury, MD Inpatient   11/22/2014 0146 11/24/2014 1755 Full Code [id number]  [name] Patience, MD ED   Advance Care Planning Activity        IV Access:    Peripheral IV   Procedures and diagnostic studies:   No results found.   Medical Consultants:    None.  Anti-Infectives:   none  Subjective:    [name] relates she still struggling to breathe.  Objective:    Vitals:   03/27/20 0215 03/27/20 0429 03/27/20 0530 03/27/20 0545  BP: (!) 138/53 (!) 141/64    Pulse: 60   79  Resp: 20 20  (!) 25  Temp: 98.3 F (36.8 C) 99 F (37.2 C)    TempSrc: Oral Oral    SpO2: 94% 90% 90% 94%  Weight: (!) 106.9 kg     Height:       SpO2: 94 % O2 Flow Rate (L/min): 25 L/min FiO2 (%): 100 %   Intake/Output Summary (Last 24 hours) at 03/27/2020 0756 Last data filed at 03/27/2020 0216 Gross per 24 hour  Intake 724 ml  Output 775 ml  Net -51 ml   Filed Weights   03/25/20 0346 03/26/20 0100 03/27/20 0215  Weight: (!) 109.8 kg (!) 108.4 kg (!) 106.9 kg    Exam: General exam: In no acute distress. Respiratory system: Good air movement and clear to auscultation. Cardiovascular system: S1 & S2 heard, RRR.  Positive JVD she is on has anasarca Gastrointestinal system: Abdomen is nondistended, soft and nontender.  Extremities: 3+ edema Skin: No rashes, lesions or ulcers Psychiatry: Judgement and insight appear normal. Mood & affect appropriate.   Data Reviewed:    Labs: Basic Metabolic Panel: Recent Labs  Lab 03/22/20 1653 03/22/20 1653 03/23/20 2409 03/23/20 7353 03/24/20 0456 03/24/20 0456 03/25/20 0425 03/26/20 0549  NA 138  --  139  --  139  --  135 136  K 5.5*   < > 4.7   < > 4.7   < > 4.7 4.3  CL 105  --  106  --  105  --  104 102  CO2 21*  --  22  --  23  --  23 23  GLUCOSE 116*  --  98  --  96  --  99 115*  BUN 62*  --  57*  --  59*  --  62* 64*  CREATININE 4.85*  --  4.89*  --  4.88*  --  4.83* 4.77*  CALCIUM 8.6*    < > 8.5*  --  8.2*   8.1*  --  8.0* 8.7*  MG 2.7*  --   --   --  2.5*  --  2.5* 2.5*  PHOS  --   --   --   --  5.3*  --   --   --    < > = values in this interval not displayed.   GFR Estimated Creatinine Clearance: 9.7  mL/min (A) (by C-G formula based on SCr of 4.77 mg/dL (H)). Liver Function Tests: Recent Labs  Lab 03/22/20 1257 03/24/20 0456  AST 19  --   ALT 12  --   ALKPHOS 66  --   BILITOT 0.5  --   PROT 6.0*  --   ALBUMIN 3.3* 2.8*   No results for input(s): LIPASE, AMYLASE in the last 168 hours. No results for input(s): AMMONIA in the last 168 hours. Coagulation profile Recent Labs  Lab 03/22/20 1257  INR 1.2   COVID-19 Labs  No results for input(s): DDIMER, FERRITIN, LDH, CRP in the last 72 hours.  Lab Results  Component Value Date   [place] NEGATIVE 03/22/2020    CBC: Recent Labs  Lab 03/22/20 1257 03/22/20 1257 03/22/20 1653 03/23/20 0635 03/24/20 0456 03/25/20 0425 03/26/20 0549  WBC 5.0   < > 5.1 5.1 5.8 4.3 5.0  NEUTROABS 3.9  --   --  2.9  --   --   --   HGB 10.2*   < > 10.0* 9.6* 9.1* 8.9* 9.0*  HCT 36.4   < > 36.3 33.4* 32.2* 31.8* 31.5*  MCV 92.6   < > 90.3 89.5 89.9 90.9 88.5  PLT 158   < > 160 152 145* 131* 136*   < > = values in this interval not displayed.   Cardiac Enzymes: No results for input(s): CKTOTAL, CKMB, CKMBINDEX, TROPONINI in the last 168 hours. BNP (last 3 results) No results for input(s): PROBNP in the last 8760 hours. CBG: No results for input(s): GLUCAP in the last 168 hours. D-Dimer: No results for input(s): DDIMER in the last 72 hours. Hgb A1c: No results for input(s): HGBA1C in the last 72 hours. Lipid Profile: No results for input(s): CHOL, HDL, LDLCALC, TRIG, CHOLHDL, LDLDIRECT in the last 72 hours. Thyroid function studies: No results for input(s): TSH, T4TOTAL, T3FREE, THYROIDAB in the last 72 hours.  Invalid input(s): FREET3 Anemia work up: No results for input(s): VITAMINB12, FOLATE, FERRITIN,  TIBC, IRON, RETICCTPCT in the last 72 hours. Sepsis Labs: Recent Labs  Lab 03/23/20 0635 03/24/20 0456 03/25/20 0425 03/26/20 0549  WBC 5.1 5.8 4.3 5.0   Microbiology Recent Results (from the past 240 hour(s))  SARS Coronavirus 2 by RT PCR (hospital order, performed in [facility] hospital lab) Nasopharyngeal Nasopharyngeal Swab     Status: None   Collection Time: 03/22/20 12:47 PM   Specimen: Nasopharyngeal Swab  Result Value Ref Range Status   SARS Coronavirus 2 NEGATIVE NEGATIVE Final    Comment: (NOTE) SARS-CoV-2 target nucleic acids are NOT DETECTED.  The SARS-CoV-2 RNA is generally detectable in upper and lower respiratory specimens during the acute phase of infection. The lowest concentration of SARS-CoV-2 viral copies this assay can detect is 250 copies / mL. A negative result does not preclude SARS-CoV-2 infection and should not be used as the sole basis for treatment or other patient management decisions.  A negative result may occur with improper specimen collection / handling, submission of specimen other than nasopharyngeal swab, presence of viral mutation(s) within the areas targeted by this assay, and inadequate number of viral copies (<250 copies / mL). A negative result must be combined with clinical observations, patient history, and epidemiological information.  Fact Sheet for Patients:   StrictlyIdeas.no  Fact Sheet for Healthcare Providers: BankingDealers.co.za  This test is not yet approved or  cleared by the [place] FDA and has been authorized for detection and/or diagnosis of SARS-CoV-2 by  FDA under an Emergency Use Authorization (EUA).  This EUA will remain in effect (meaning this test can be used) for the duration of the COVID-19 declaration under Section 564(b)(1) of the Act, 21 U.S.C. section 360bbb-3(b)(1), unless the authorization is terminated or revoked sooner.  Performed at [facility], [place] [address], [place], [place] [address]      Medications:    aspirin EC  81 mg Oral Daily   ferrous sulfate  325 mg Oral QHS   heparin  5,000 Units Subcutaneous Q8H   hydrALAZINE  125 mg Oral TID   hydrOXYzine  50 mg Oral QHS   isosorbide mononitrate  120 mg Oral Daily   lactulose  10 g Oral Q2000   levothyroxine  75 mcg Oral QAC breakfast   metoprolol succinate  12.5 mg Oral Daily   rosuvastatin  10 mg Oral QPM   sodium chloride flush  3 mL Intravenous Q12H   Continuous Infusions:  sodium chloride     furosemide 160 mg (03/27/20 0451)      LOS: 5 days   Charlynne Cousins  [facility]  03/27/2020, 7:56 AM

## 2020-03-27 NOTE — Progress Notes (Signed)
Manufacturing engineer [facility]) Hospital Liaison Note  Received referral for family interest in [facility]. Spoke to patient son Delrae Alfred) and daughter [name]) to confirm interest, explain services and answer questions. Unfortunately, [facility] does not have a bed to offer today but a [facility] HLT member will update the family/TOC on bed availability tomorrow.  Please do not hesitate to call with any hospice related questions,  Gar Ponto, RN [facility] HLT [phone]

## 2020-03-28 DIAGNOSIS — E7849 Other hyperlipidemia: Secondary | ICD-10-CM

## 2020-03-28 DIAGNOSIS — I10 Essential (primary) hypertension: Secondary | ICD-10-CM

## 2020-03-28 DIAGNOSIS — J9601 Acute respiratory failure with hypoxia: Secondary | ICD-10-CM | POA: Diagnosis not present

## 2020-03-28 DIAGNOSIS — I5033 Acute on chronic diastolic (congestive) heart failure: Secondary | ICD-10-CM | POA: Diagnosis not present

## 2020-03-28 DIAGNOSIS — E872 Acidosis: Secondary | ICD-10-CM

## 2020-03-28 MED ORDER — SODIUM CHLORIDE 0.9 % IV SOLN: 1.0000 mg/h | INTRAVENOUS | Status: AC

## 2020-03-28 MED ORDER — LORAZEPAM 2 MG/ML IJ SOLN
1.0000 mg | INTRAMUSCULAR | Status: DC | PRN
  Administered 2020-03-28 (×2): 1 mg via INTRAVENOUS
  Filled 2020-03-28 (×2): qty 1

## 2020-03-28 MED ORDER — TRAZODONE HCL 50 MG PO TABS: 50.0000 mg | ORAL_TABLET | Freq: Every evening | ORAL | Status: AC | PRN

## 2020-03-28 MED ORDER — LORAZEPAM 2 MG/ML IJ SOLN: 1.0000 mg | INTRAMUSCULAR | 0 refills | Status: AC | PRN

## 2020-03-28 NOTE — Progress Notes (Signed)
Nurse spoke with Linna Hoff RN in [place] to give report.

## 2020-03-28 NOTE — Progress Notes (Signed)
[facility] Documentation     Pt has been approved for [employer] transfer. [facility] does have a bed available for pt today. Paperwork has been completed and transportation can be arranged.      Please call [facility] at [phone] to give charge nurse report and fax discharge summary to [phone].     Please leave IV in. May leave catheter in place if pt has one. Please send pt to [facility] with DNR paperwork.      Please call with any questions.      Thank you,   Freddie Breech, RN

## 2020-03-28 NOTE — Discharge Summary (Signed)
Physician Discharge Summary  Monaca Wadas OEU:[id number] DOB: 19-Feb-1929 DOA: 03/22/2020  PCP: Antonietta Jewel, MD  Admit date: 03/22/2020 Discharge date: 03/28/2020  Admitted From: Home Disposition: Residential hospice facility.    Recommendations for Outpatient Follow-up:  1. He was transferred to residential hospice facility [facility]  Home Health:No Equipment/Devices:None  Discharge Condition:Stable CODE STATUS:DNR Diet recommendation: Heart Healthy  Brief/Interim Summary: 86 y.o. female past medical history significant for chronic diastolic heart failure, chronic kidney disease stage IV, anemia of chronic disease essential hypertension History of DVT morbid obesity presents to the ED with worsening shortness of breath, chest x-ray showed pulmonary edema with elevated BNP.  Nephrology was consulted due to worsening renal function.  Discharge Diagnoses:  Principal Problem:   Acute respiratory failure with hypoxia (HCC) Active Problems:   Hyperkalemia   Metabolic acidosis   Renal failure (ARF), acute on chronic (HCC)   Hypertension   Sinus bradycardia   Acute on chronic diastolic heart failure (HCC)   HLD (hyperlipidemia)   Depression   Hypothyroidism  Acute respiratory failure with hypoxia due to acute diastolic heart failure and bilateral pleural effusions: She was started on IV Lasix and IV metolazone with poor response and minimal diuresis nephrology was consulted, they discussed with the family and the family and patient were very clear they would not not like to proceed with hemodialysis, after several days of aggressive treatment her renal function deteriorated her urine output decreasing when this was explained to the family they decided to move towards comfort care she was started on IV hydromorphone for shortness of breath and transferred to residential hospice facility.  Acute on chronic kidney disease stage IV/cardiorenal syndrome/metabolic acidosis: With a  baseline creatinine of 3.4 and admission 4.90 worsened with diuresis with poor urine output. Family decided to move towards comfort care.  Lower extremity pain/history of DVT likely due to volume overload.  Hypokalemia: Treated with Lokelma.  Has bradycardia: Currently asymptomatic.  Hyperlipidemia Anemia of chronic disease Hypothyroidism Gout Depression anxiety  Discharge Instructions  Discharge Instructions    Diet - low sodium heart healthy   Complete by: As directed    Increase activity slowly   Complete by: As directed      Allergies as of 03/28/2020      Reactions   Naprosyn [naproxen] Itching, Other (See Comments)   Hot burning feeling, also      Medication List    STOP taking these medications   allopurinol 100 MG tablet Commonly known as: ZYLOPRIM   amLODipine 10 MG tablet Commonly known as: NORVASC   aspirin EC 81 MG tablet   docusate sodium 100 MG capsule Commonly known as: COLACE   ferrous sulfate 325 (65 FE) MG tablet   hydrALAZINE 50 MG tablet Commonly known as: APRESOLINE   HYDROcodone-acetaminophen 10-325 MG tablet Commonly known as: NORCO   hydrOXYzine 50 MG capsule Commonly known as: VISTARIL   isosorbide mononitrate 120 MG 24 hr tablet Commonly known as: IMDUR   lactulose 10 GM/15ML solution Commonly known as: CHRONULAC   levothyroxine 75 MCG tablet Commonly known as: SYNTHROID   metoprolol succinate 25 MG 24 hr tablet Commonly known as: TOPROL-XL   multivitamin with minerals Tabs tablet   rosuvastatin 10 MG tablet Commonly known as: CRESTOR   torsemide 20 MG tablet Commonly known as: DEMADEX     TAKE these medications   HYDROmorphone 25 mg in sodium chloride 0.9 % 47.5 mL Inject 1 mg/hr into the vein continuous.   LORazepam 2 MG/ML injection  Commonly known as: ATIVAN Inject 0.5 mLs (1 mg total) into the vein every 4 (four) hours as needed for anxiety or sedation.   traZODone 50 MG tablet Commonly known as:  DESYREL Take 1 tablet (50 mg total) by mouth at bedtime as needed for sleep. What changed:   medication strength  how much to take       Allergies  Allergen Reactions  . Naprosyn [Naproxen] Itching and Other (See Comments)    Hot burning feeling, also    Consultations:  Nephrology   Procedures/Studies: DG Chest 2 View  Result Date: 03/25/2020 CLINICAL DATA:  Pleural effusion EXAM: CHEST - 2 VIEW COMPARISON:  March 22, 2020 FINDINGS: There is cardiomegaly with pulmonary venous hypertension. Small pleural effusions noted. There is consolidation in the left lower lung region. There is mild interstitial edema. No adenopathy. There is aortic atherosclerosis. There is degenerative change in the thoracic spine. IMPRESSION: Cardiomegaly with pulmonary vascular congestion. There is a degree of interstitial edema and bilateral pleural effusions consistent with congestive heart failure. Question alveolar edema versus pneumonia left lower lobe region. Both edema and pneumonia may be present concurrently. Aortic Atherosclerosis (ICD10-I70.0). Electronically Signed   By: Lowella Grip III M.D.   On: 03/25/2020 07:23   DG Chest Port 1 View  Result Date: 03/22/2020 CLINICAL DATA:  Shortness of breath, CHF EXAM: PORTABLE CHEST 1 VIEW COMPARISON:  02/21/2020 FINDINGS: Slight worsening pulmonary edema pattern throughout the mid and lower lungs with further obscuration of the cardiac borders. Pleural effusions noted bilaterally. Associated bibasilar atelectasis/partial collapse. No pneumothorax. Trachea midline. Aorta atherosclerotic. Degenerative changes of the spine. IMPRESSION: Worsening CHF pattern Electronically Signed   By: Jerilynn Mages.  Shick M.D.   On: 03/22/2020 13:10   VAS [place] LOWER EXTREMITY VENOUS (DVT)  Result Date: 03/23/2020  Lower Venous DVTStudy Indications: Swelling.  Risk Factors: None identified CHF. Limitations: Body habitus, poor ultrasound/tissue interface and patient pain tolerance.  Comparison Study: No prior studies. Performing Technologist: Oliver Hum RVT  Examination Guidelines: A complete evaluation includes B-mode imaging, spectral Doppler, color Doppler, and power Doppler as needed of all accessible portions of each vessel. Bilateral testing is considered an integral part of a complete examination. Limited examinations for reoccurring indications may be performed as noted. The reflux portion of the exam is performed with the patient in reverse Trendelenburg.  +---------+---------------+---------+-----------+----------+--------------+ RIGHT    CompressibilityPhasicitySpontaneityPropertiesThrombus Aging +---------+---------------+---------+-----------+----------+--------------+ CFV      Full           Yes      Yes                                 +---------+---------------+---------+-----------+----------+--------------+ SFJ      Full                                                        +---------+---------------+---------+-----------+----------+--------------+ FV Prox  Full                                                        +---------+---------------+---------+-----------+----------+--------------+ FV Mid  Not visualized +---------+---------------+---------+-----------+----------+--------------+ FV Distal                                             Not visualized +---------+---------------+---------+-----------+----------+--------------+ PFV      Full                                                        +---------+---------------+---------+-----------+----------+--------------+ POP      Full           Yes      Yes                                 +---------+---------------+---------+-----------+----------+--------------+ PTV      Full                                                        +---------+---------------+---------+-----------+----------+--------------+ PERO                                                   Not visualized +---------+---------------+---------+-----------+----------+--------------+   +---------+---------------+---------+-----------+----------+--------------+ LEFT     CompressibilityPhasicitySpontaneityPropertiesThrombus Aging +---------+---------------+---------+-----------+----------+--------------+ CFV      Full           Yes      Yes                                 +---------+---------------+---------+-----------+----------+--------------+ SFJ      Full                                                        +---------+---------------+---------+-----------+----------+--------------+ FV Prox  Full                                                        +---------+---------------+---------+-----------+----------+--------------+ FV Mid                                                Not visualized +---------+---------------+---------+-----------+----------+--------------+ FV Distal                                             Not visualized +---------+---------------+---------+-----------+----------+--------------+ PFV  Not visualized +---------+---------------+---------+-----------+----------+--------------+ POP      Full           Yes      Yes                                 +---------+---------------+---------+-----------+----------+--------------+ PTV      Full                                                        +---------+---------------+---------+-----------+----------+--------------+ PERO     Full                                                        +---------+---------------+---------+-----------+----------+--------------+     Summary: RIGHT: - There is no evidence of deep vein thrombosis in the lower extremity. However, portions of this examination were limited- see technologist comments above.  - No cystic structure found in  the popliteal fossa.  LEFT: - There is no evidence of deep vein thrombosis in the lower extremity. However, portions of this examination were limited- see technologist comments above.  - No cystic structure found in the popliteal fossa.  *See table(s) above for measurements and observations. Electronically signed by Monica Martinez MD on 03/23/2020 at 11:37:26 AM.    Final     (Echo, Carotid, EGD, Colonoscopy, ERCP)    Subjective: Comfortable in bed.  Discharge Exam: Vitals:   03/28/20 0515 03/28/20 0903  BP: (!) 142/105   Pulse: 59 67  Resp: 20 22  Temp: 98.1 F (36.7 C)   SpO2:  95%   Vitals:   03/28/20 0008 03/28/20 0042 03/28/20 0515 03/28/20 0903  BP:  (!) 131/100 (!) 142/105   Pulse:  97 59 67  Resp:  20 20 22   Temp:  98.7 F (37.1 C) 98.1 F (36.7 C)   TempSrc:  Oral Oral   SpO2: 95%   95%  Weight:  (!) 105.7 kg    Height:        General: Pt is alert, awake, not in acute distress Cardiovascular: RRR, S1/S2 +, no rubs, no gallops Respiratory: CTA bilaterally, no wheezing, no rhonchi Abdominal: Soft, NT, ND, bowel sounds + Extremities: no edema, no cyanosis    The results of significant diagnostics from this hospitalization (including imaging, microbiology, ancillary and laboratory) are listed below for reference.     Microbiology: Recent Results (from the past 240 hour(s))  SARS Coronavirus 2 by RT PCR (hospital order, performed in [facility] hospital lab) Nasopharyngeal Nasopharyngeal Swab     Status: None   Collection Time: 03/22/20 12:47 PM   Specimen: Nasopharyngeal Swab  Result Value Ref Range Status   SARS Coronavirus 2 NEGATIVE NEGATIVE Final    Comment: (NOTE) SARS-CoV-2 target nucleic acids are NOT DETECTED.  The SARS-CoV-2 RNA is generally detectable in upper and lower respiratory specimens during the acute phase of infection. The lowest concentration of SARS-CoV-2 viral copies this assay can detect is 250 copies / mL. A negative result does  not preclude SARS-CoV-2 infection and should not be used as the sole basis for treatment or other patient management  decisions.  A negative result may occur with improper specimen collection / handling, submission of specimen other than nasopharyngeal swab, presence of viral mutation(s) within the areas targeted by this assay, and inadequate number of viral copies (<250 copies / mL). A negative result must be combined with clinical observations, patient history, and epidemiological information.  Fact Sheet for Patients:   StrictlyIdeas.no  Fact Sheet for Healthcare Providers: BankingDealers.co.za  This test is not yet approved or  cleared by the [place] FDA and has been authorized for detection and/or diagnosis of SARS-CoV-2 by FDA under an Emergency Use Authorization (EUA).  This EUA will remain in effect (meaning this test can be used) for the duration of the COVID-19 declaration under Section 564(b)(1) of the Act, 21 U.S.C. section 360bbb-3(b)(1), unless the authorization is terminated or revoked sooner.  Performed at [facility], [place] [address], [place], [place] [address]      Labs: BNP (last 3 results) Recent Labs    02/16/20 1450 03/22/20 1257 03/25/20 0425  BNP 1,863.0* 2,522.6* 7,672.0*   Basic Metabolic Panel: Recent Labs  Lab 03/22/20 1653 03/22/20 1653 03/23/20 0635 03/24/20 0456 03/25/20 0425 03/26/20 0549  NA 138  --  139 139 135 136  K 5.5*  --  4.7 4.7 4.7 4.3  CL 105  --  106 105 104 102  CO2 21*  --  22 23 23 23   GLUCOSE 116*  --  98 96 99 115*  BUN 62*  --  57* 59* 62* 64*  CREATININE 4.85*  --  4.89* 4.88* 4.83* 4.77*  CALCIUM 8.6*   < > 8.5* 8.2*  8.1* 8.0* 8.7*  MG 2.7*  --   --  2.5* 2.5* 2.5*  PHOS  --   --   --  5.3*  --   --    < > = values in this interval not displayed.   Liver Function Tests: Recent Labs  Lab 03/22/20 1257 03/24/20 0456  AST 19  --   ALT 12  --    ALKPHOS 66  --   BILITOT 0.5  --   PROT 6.0*  --   ALBUMIN 3.3* 2.8*   No results for input(s): LIPASE, AMYLASE in the last 168 hours. No results for input(s): AMMONIA in the last 168 hours. CBC: Recent Labs  Lab 03/22/20 1257 03/22/20 1257 03/22/20 1653 03/23/20 0635 03/24/20 0456 03/25/20 0425 03/26/20 0549  WBC 5.0   < > 5.1 5.1 5.8 4.3 5.0  NEUTROABS 3.9  --   --  2.9  --   --   --   HGB 10.2*   < > 10.0* 9.6* 9.1* 8.9* 9.0*  HCT 36.4   < > 36.3 33.4* 32.2* 31.8* 31.5*  MCV 92.6   < > 90.3 89.5 89.9 90.9 88.5  PLT 158   < > 160 152 145* 131* 136*   < > = values in this interval not displayed.   Cardiac Enzymes: No results for input(s): CKTOTAL, CKMB, CKMBINDEX, TROPONINI in the last 168 hours. BNP: Invalid input(s): POCBNP CBG: No results for input(s): GLUCAP in the last 168 hours. D-Dimer No results for input(s): DDIMER in the last 72 hours. Hgb A1c No results for input(s): HGBA1C in the last 72 hours. Lipid Profile No results for input(s): CHOL, HDL, LDLCALC, TRIG, CHOLHDL, LDLDIRECT in the last 72 hours. Thyroid function studies No results for input(s): TSH, T4TOTAL, T3FREE, THYROIDAB in the last 72 hours.  Invalid input(s): FREET3 Anemia work  up No results for input(s): VITAMINB12, FOLATE, FERRITIN, TIBC, IRON, RETICCTPCT in the last 72 hours. Urinalysis    Component Value Date/Time   COLORURINE YELLOW 02/22/2020 1842   APPEARANCEUR CLEAR 02/22/2020 1842   LABSPEC 1.014 02/22/2020 1842   PHURINE 6.0 02/22/2020 1842   GLUCOSEU NEGATIVE 02/22/2020 1842   HGBUR NEGATIVE 02/22/2020 1842   BILIRUBINUR NEGATIVE 02/22/2020 1842   KETONESUR NEGATIVE 02/22/2020 1842   PROTEINUR >=300 (A) 02/22/2020 1842   UROBILINOGEN 0.2 11/21/2014 2230   NITRITE NEGATIVE 02/22/2020 1842   LEUKOCYTESUR NEGATIVE 02/22/2020 1842   Sepsis Labs Invalid input(s): PROCALCITONIN,  WBC,  LACTICIDVEN Microbiology Recent Results (from the past 240 hour(s))  SARS Coronavirus 2 by  RT PCR (hospital order, performed in [facility] hospital lab) Nasopharyngeal Nasopharyngeal Swab     Status: None   Collection Time: 03/22/20 12:47 PM   Specimen: Nasopharyngeal Swab  Result Value Ref Range Status   SARS Coronavirus 2 NEGATIVE NEGATIVE Final    Comment: (NOTE) SARS-CoV-2 target nucleic acids are NOT DETECTED.  The SARS-CoV-2 RNA is generally detectable in upper and lower respiratory specimens during the acute phase of infection. The lowest concentration of SARS-CoV-2 viral copies this assay can detect is 250 copies / mL. A negative result does not preclude SARS-CoV-2 infection and should not be used as the sole basis for treatment or other patient management decisions.  A negative result may occur with improper specimen collection / handling, submission of specimen other than nasopharyngeal swab, presence of viral mutation(s) within the areas targeted by this assay, and inadequate number of viral copies (<250 copies / mL). A negative result must be combined with clinical observations, patient history, and epidemiological information.  Fact Sheet for Patients:   StrictlyIdeas.no  Fact Sheet for Healthcare Providers: BankingDealers.co.za  This test is not yet approved or  cleared by the [place] FDA and has been authorized for detection and/or diagnosis of SARS-CoV-2 by FDA under an Emergency Use Authorization (EUA).  This EUA will remain in effect (meaning this test can be used) for the duration of the COVID-19 declaration under Section 564(b)(1) of the Act, 21 U.S.C. section 360bbb-3(b)(1), unless the authorization is terminated or revoked sooner.  Performed at [facility], [place] [address] [place], [place] [address]      Time coordinating discharge: Over 30 minutes  SIGNED:   Charlynne Cousins, MD  [facility] 03/28/2020, 9:44 AM Pager   If 7PM-7AM, please contact  night-coverage [email or web address] Password [email or web address]

## 2020-03-28 NOTE — Progress Notes (Signed)
Confirming with [facility] that continuous infusion dilaudid can be continued prior to D/C.

## 2020-03-28 NOTE — Progress Notes (Signed)
Pt was transported to [facility] by [employer] [facility].  Pt's belongings with daughter : dentures.

## 2020-03-28 NOTE — Care Management Important Message (Signed)
Important Message  Patient Details  Name: Caiya Bettes MRN: [id number] Date of Birth: 1929/08/07   Medicare Important Message Given:  Yes     Shelda Altes 03/28/2020, 8:38 AM

## 2020-03-28 NOTE — Progress Notes (Signed)
[facility] can accept continuous infusion dilaudid but stated to disconnect it for transport and leave IV in place for IVP dilaudid to be given until continuous infusion can be replaced.

## 2020-04-01 ENCOUNTER — Encounter: Payer: Self-pay | Admitting: Physician Assistant

## 2020-04-01 NOTE — Progress Notes (Deleted)
Cardiology Office Note    Date:  04/01/2020   ID:  [name], DOB 1929-05-15, MRN [id number]  PCP:  Antonietta Jewel, MD  Cardiologist:  Mertie Moores, MD  Electrophysiologist:  None   Chief Complaint: f/u CHF  History of Present Illness:   [name] is a 86 y.o. female with history of chronic diastolic CHF with severe pulmonary HTN, CKD stage IV, HTN, HLD, PAD, mild renal artery stenosis, carotid artery disease (60-79% 07/2019 followed by Dr. Trula Slade), prior DVT, obesity, bradycardia requiring cessation of beta blocker, anemia of chronic disease, hypothyroiism, depression, anxiety who returns for post-hospital follow-up. Cardiac hx as outlined above. Also with prior negative nuc 2002. She has been admitted twice in the last 2 months, in June 2021 and then most recently as 7/24-7/30/21. Last echo 02/17/20 with EF 65-70%, severe LVH, grade 2 DD, elevated LVEDP, RV normal but severe pulmonary HTN, moderate MR. During last admission she had poor response to IV Lasix and IV metolazone. The patient and family were clear she did not wish to be on dialysis so was transitioned to comfort care.   Recent acute on chronic diastolic CHF Severe pulmonary HTN History of bradycardia CKD stage IV   Labwork independently reviewed: 02/2020 Hgb 9, Plt 136, K 4.3 Cr 4.77   Past Medical History:  Diagnosis Date  . Anxiety   . Arthritis   . Bradycardia   . Carotid artery disease ([place])   . Chronic diastolic CHF (congestive heart failure) ([place])   . CKD (chronic kidney disease), stage IV ([place])   . Depression   . History of DVT (deep vein thrombosis)   . Hypercholesteremia   . Hypertensive heart and kidney disease with heart failure and with chronic kidney disease stage IV ([name])   . PAD (peripheral artery disease) ([place])   . Pulmonary hypertension ([place])   . Renal artery stenosis [facility])     Past Surgical History:  Procedure Laterality Date  . CATARACT EXTRACTION W/PHACO Right 03/20/2014    Procedure: CATARACT EXTRACTION PHACO AND INTRAOCULAR LENS PLACEMENT (IOC);  Surgeon: [name], MD;  Location: [facility];  Service: Ophthalmology;  Laterality: Right;  . failed knee surgery     on the right x 2  . JOINT REPLACEMENT     both knees  . left ankle orif      Current Medications: No outpatient medications have been marked as taking for the 04/02/20 encounter (Appointment) with [name], PA-C.   ***   Allergies:   Naprosyn [naproxen]   Social History   Socioeconomic History  . Marital status: Single    Spouse name: Not on file  . Number of children: Not on file  . Years of education: Not on file  . Highest education level: Not on file  Occupational History  . Not on file  Tobacco Use  . Smoking status: Never Smoker  . Smokeless tobacco: Never Used  Vaping Use  . Vaping Use: Never used  Substance and Sexual Activity  . Alcohol use: Yes    Alcohol/week: 1.0 standard drink    Types: 1 Standard drinks or equivalent per week    Comment: occasional  . Drug use: No  . Sexual activity: Not on file  Other Topics Concern  . Not on file  Social History Narrative   Retired   Worked at [employer] x 25 years ([place])   [employer] childcare business for 20 years   Divorced   6 kids (3 boys, 3 girls)  23 grandkids   Social Determinants of Radio broadcast assistant Strain:   . Difficulty of Paying Living Expenses:   Food Insecurity:   . Worried About Charity fundraiser in the Last Year:   . Arboriculturist in the Last Year:   Transportation Needs:   . Film/video editor (Medical):   Marland Kitchen Lack of Transportation (Non-Medical):   Physical Activity:   . Days of Exercise per Week:   . Minutes of Exercise per Session:   Stress:   . Feeling of Stress :   Social Connections:   . Frequency of Communication with Friends and Family:   . Frequency of Social Gatherings with Friends and Family:   . Attends Religious Services:   . Active Member of Clubs or Organizations:    . Attends Archivist Meetings:   Marland Kitchen Marital Status:      Family History:  The patient's ***family history includes Diabetes Mellitus II in her daughter; Hypertension in her daughter and mother; Stroke in her father. There is no history of Heart attack.  ROS:   Please see the history of present illness. Otherwise, review of systems is positive for ***.  All other systems are reviewed and otherwise negative.    EKGs/Labs/Other Studies Reviewed:    Studies reviewed are outlined and summarized above. Reports included below if pertinent.  2D Echo 01/2020 1. Left ventricular ejection fraction, by estimation, is 65 to 70%. The  left ventricle has normal function. The left ventricle has no regional  wall motion abnormalities. There is severe concentric left ventricular  hypertrophy. Left ventricular diastolic  parameters are consistent with Grade II diastolic dysfunction  (pseudonormalization). Elevated left ventricular end-diastolic pressure.  The average left ventricular global longitudinal strain is -14.2 %.  2. Right ventricular systolic function is normal. The right ventricular  size is normal. There is severely elevated pulmonary artery systolic  pressure. The estimated right ventricular systolic pressure is 38.1 mmHg.  3. Left atrial size was mildly dilated.  4. Large pleural effusion in the left lateral region.  5. The mitral valve is normal in structure. Moderate mitral valve  regurgitation. No evidence of mitral stenosis.  6. The aortic valve is normal in structure. Aortic valve regurgitation is  mild. Mild to moderate aortic valve sclerosis/calcification is present,  without any evidence of aortic stenosis.  7. The inferior vena cava is normal in size with greater than 50%  respiratory variability, suggesting right atrial pressure of 3 mmHg.  NST 09/2000 IMPRESSION  1. NORMAL PERFUSION. NO ISCHEMIA.  2. EJECTION FRACTION IS 71%.    EKG:  EKG is  ordered today, personally reviewed, demonstrating ***  Recent Labs: 03/22/2020: ALT 12; TSH 6.402 03/25/2020: B Natriuretic Peptide 2,186.0 03/26/2020: BUN 64; Creatinine, Ser 4.77; Hemoglobin 9.0; Magnesium 2.5; Platelets 136; Potassium 4.3; Sodium 136  Recent Lipid Panel No results found for: CHOL, TRIG, HDL, CHOLHDL, VLDL, LDLCALC, LDLDIRECT  PHYSICAL EXAM:    VS:  There were no vitals taken for this visit.  BMI: There is no height or weight on file to calculate BMI.  GEN: Well nourished, well developed, in no acute distress HEENT: normocephalic, atraumatic Neck: no JVD, carotid bruits, or masses Cardiac: ***RRR; no murmurs, rubs, or gallops, no edema  Respiratory:  clear to auscultation bilaterally, normal work of breathing GI: soft, nontender, nondistended, + BS MS: no deformity or atrophy Skin: warm and dry, no rash Neuro:  Alert and Oriented x 3, Strength and sensation  are intact, follows commands Psych: euthymic mood, full affect  Wt Readings from Last 3 Encounters:  03/28/20 (!) 233 lb 0.4 oz (105.7 kg)  02/24/20 217 lb 13 oz (98.8 kg)  01/02/20 221 lb (100.2 kg)     ASSESSMENT & PLAN:   1. ***  Disposition: F/u with ***   Medication Adjustments/Labs and Tests Ordered: Current medicines are reviewed at length with the patient today.  Concerns regarding medicines are outlined above. Medication changes, Labs and Tests ordered today are summarized above and listed in the Patient Instructions accessible in Encounters.   Signed, [name], PA-C  04/01/2020 7:54 AM    [address], [place], [place]  [address] Phone: [phone]; Fax: [phone]

## 2020-04-02 ENCOUNTER — Ambulatory Visit: Payer: Medicare Other | Admitting: Physician Assistant

## 2020-04-02 DIAGNOSIS — R001 Bradycardia, unspecified: Secondary | ICD-10-CM

## 2020-04-02 DIAGNOSIS — I272 Pulmonary hypertension, unspecified: Secondary | ICD-10-CM

## 2020-04-02 DIAGNOSIS — I5033 Acute on chronic diastolic (congestive) heart failure: Secondary | ICD-10-CM

## 2020-04-02 DIAGNOSIS — N184 Chronic kidney disease, stage 4 (severe): Secondary | ICD-10-CM

## 2020-04-30 DEATH — deceased

## 2020-07-08 ENCOUNTER — Ambulatory Visit (HOSPITAL_COMMUNITY)
Admission: RE | Admit: 2020-07-08 | Payer: Medicare Other | Source: Ambulatory Visit | Attending: Physician Assistant | Admitting: Physician Assistant
